# Patient Record
Sex: Female | Born: 1995 | Race: White | Hispanic: No | Marital: Married | Smoking: Never smoker
Health system: Southern US, Community
[De-identification: ages and names within clinical notes are randomized; demographics above are authoritative.]

## PROBLEM LIST (undated history)

## (undated) ENCOUNTER — Inpatient Hospital Stay: Payer: Self-pay

## (undated) ENCOUNTER — Inpatient Hospital Stay (HOSPITAL_COMMUNITY): Payer: Self-pay

## (undated) DIAGNOSIS — O24419 Gestational diabetes mellitus in pregnancy, unspecified control: Secondary | ICD-10-CM

## (undated) DIAGNOSIS — E669 Obesity, unspecified: Secondary | ICD-10-CM

## (undated) DIAGNOSIS — J45909 Unspecified asthma, uncomplicated: Secondary | ICD-10-CM

## (undated) DIAGNOSIS — R519 Headache, unspecified: Secondary | ICD-10-CM

## (undated) DIAGNOSIS — Z789 Other specified health status: Secondary | ICD-10-CM

## (undated) HISTORY — PX: NO PAST SURGERIES: SHX2092

## (undated) HISTORY — DX: Gestational diabetes mellitus in pregnancy, unspecified control: O24.419

---
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
## 2014-01-31 ENCOUNTER — Ambulatory Visit: Payer: Self-pay | Admitting: Family Medicine

## 2014-01-31 LAB — RAPID STREP-A WITH REFLX: MICRO TEXT REPORT: NEGATIVE

## 2014-02-01 LAB — BETA STREP CULTURE(ARMC)

## 2014-02-16 ENCOUNTER — Ambulatory Visit: Payer: Self-pay | Admitting: Family Medicine

## 2014-11-30 NOTE — L&D Delivery Note (Signed)
Deliver Note   Date of Delivery:   08/12/2015 Primary OB:   [facility] Gestational Age/EDD: [redacted]w[redacted]d by 07/24/2015, by Ultrasound  Antepartum complications:  OB History as of 07/15/15    Gravida Para Term Preterm AB TAB SAB Ectopic Multiple Living   0 0      Delivered By:   Vena Austria MD  Delivery Type:   TSVD, epidural     Intrapartum complications:  GBS:    Negative (07/27 0000) Laceration:      none Episiotomy:    none Placenta:    Spontaneous Estimated Blood Loss:   Baby:     Liveborn female  APGAR (1 MIN): 9  APGAR (5 MINS):  9  weight 2880g  Deliver Details   At 02:37 a female was delivered via TSVD (Presentation:OA  ).  APGAR: 9 ,9; weigh 2880g   Placenta status:intact , spontaneous.  Cord: 3 vessel cord, with the following complications: IOL GHTN   Mom to postpartum.  Baby to Couplet care / Skin to Skin.

## 2014-12-01 ENCOUNTER — Emergency Department (HOSPITAL_COMMUNITY): Payer: Medicaid Other

## 2014-12-01 ENCOUNTER — Encounter (HOSPITAL_COMMUNITY): Payer: Self-pay | Admitting: Emergency Medicine

## 2014-12-01 ENCOUNTER — Emergency Department (HOSPITAL_COMMUNITY)
Admission: EM | Admit: 2014-12-01 | Discharge: 2014-12-01 | Disposition: A | Discharge status: Home / Self Care | Payer: Medicaid Other | Attending: Emergency Medicine | Admitting: Emergency Medicine

## 2014-12-01 DIAGNOSIS — O9989 Other specified diseases and conditions complicating pregnancy, childbirth and the puerperium: Secondary | ICD-10-CM | POA: Diagnosis present

## 2014-12-01 DIAGNOSIS — Z79899 Other long term (current) drug therapy: Secondary | ICD-10-CM | POA: Diagnosis not present

## 2014-12-01 DIAGNOSIS — O26899 Other specified pregnancy related conditions, unspecified trimester: Secondary | ICD-10-CM

## 2014-12-01 DIAGNOSIS — O418X10 Other specified disorders of amniotic fluid and membranes, first trimester, not applicable or unspecified: Secondary | ICD-10-CM | POA: Diagnosis not present

## 2014-12-01 DIAGNOSIS — Z3A01 Less than 8 weeks gestation of pregnancy: Secondary | ICD-10-CM | POA: Insufficient documentation

## 2014-12-01 DIAGNOSIS — O200 Threatened abortion: Secondary | ICD-10-CM | POA: Insufficient documentation

## 2014-12-01 DIAGNOSIS — R102 Pelvic and perineal pain: Secondary | ICD-10-CM

## 2014-12-01 DIAGNOSIS — O468X1 Other antepartum hemorrhage, first trimester: Secondary | ICD-10-CM

## 2014-12-01 LAB — CBC WITH DIFFERENTIAL/PLATELET
BASOS ABS: 0 10*3/uL (ref 0.0–0.1)
BASOS PCT: 0 % (ref 0–1)
Eosinophils Absolute: 0.1 10*3/uL (ref 0.0–0.7)
Eosinophils Relative: 1 % (ref 0–5)
HCT: 36.8 % (ref 36.0–46.0)
Hemoglobin: 12.3 g/dL (ref 12.0–15.0)
Lymphocytes Relative: 28 % (ref 12–46)
Lymphs Abs: 3.1 10*3/uL (ref 0.7–4.0)
MCH: 28.8 pg (ref 26.0–34.0)
MCHC: 33.4 g/dL (ref 30.0–36.0)
MCV: 86.2 fL (ref 78.0–100.0)
MONO ABS: 0.9 10*3/uL (ref 0.1–1.0)
Monocytes Relative: 8 % (ref 3–12)
NEUTROS ABS: 7 10*3/uL (ref 1.7–7.7)
NEUTROS PCT: 63 % (ref 43–77)
Platelets: 247 10*3/uL (ref 150–400)
RBC: 4.27 MIL/uL (ref 3.87–5.11)
RDW: 12.8 % (ref 11.5–15.5)
WBC: 11.1 10*3/uL — AB (ref 4.0–10.5)

## 2014-12-01 LAB — BASIC METABOLIC PANEL
ANION GAP: 7 (ref 5–15)
BUN: 9 mg/dL (ref 6–23)
CHLORIDE: 105 meq/L (ref 96–112)
CO2: 25 mmol/L (ref 19–32)
Calcium: 9 mg/dL (ref 8.4–10.5)
Creatinine, Ser: 0.55 mg/dL (ref 0.50–1.10)
GFR calc non Af Amer: >90 mL/min (ref >90)
Glucose, Bld: 96 mg/dL (ref 70–99)
POTASSIUM: 3.7 mmol/L (ref 3.5–5.1)
SODIUM: 137 mmol/L (ref 135–145)

## 2014-12-01 LAB — URINE MICROSCOPIC-ADD ON

## 2014-12-01 LAB — SAMPLE TO BLOOD BANK

## 2014-12-01 LAB — PREGNANCY, URINE: Preg Test, Ur: POSITIVE — AB

## 2014-12-01 LAB — URINALYSIS, ROUTINE W REFLEX MICROSCOPIC
Bilirubin Urine: NEGATIVE
GLUCOSE, UA: NEGATIVE mg/dL
Hgb urine dipstick: NEGATIVE
Ketones, ur: NEGATIVE mg/dL
Nitrite: NEGATIVE
PH: 6 (ref 5.0–8.0)
Protein, ur: NEGATIVE mg/dL
Specific Gravity, Urine: 1.025 (ref 1.005–1.030)
Urobilinogen, UA: 0.2 mg/dL (ref 0.0–1.0)

## 2014-12-01 LAB — OB RESULTS CONSOLE HEPATITIS B SURFACE ANTIGEN: HEP B S AG: NEGATIVE

## 2014-12-01 LAB — I-STAT BETA HCG BLOOD, ED (MC, WL, AP ONLY): I-stat hCG, quantitative: >2000 m[IU]/mL — ABNORMAL HIGH (ref <5)

## 2014-12-01 LAB — OB RESULTS CONSOLE VARICELLA ZOSTER ANTIBODY, IGG: Varicella: NON-IMMUNE/NOT IMMUNE

## 2014-12-01 LAB — OB RESULTS CONSOLE RUBELLA ANTIBODY, IGM: Rubella: IMMUNE

## 2014-12-01 NOTE — Discharge Instructions (Signed)
Get plenty of rest, and drink a lot of fluids. No sexual intercourse, until you that are evaluated by a gynecologist. If you develop increased pain, bleeding or other discomforts, go immediately to [facility] in [place] to be seen by a gynecologist. Try to see a obstetrician, as soon as possible for further care, of the pregnancy.   Pelvic Rest Pelvic rest is sometimes recommended for women when:   The placenta is partially or completely covering the opening of the cervix (placenta previa).  There is bleeding between the uterine wall and the amniotic sac in the first trimester (subchorionic hemorrhage).  The cervix begins to open without labor starting (incompetent cervix, cervical insufficiency).  The labor is too early (preterm labor). HOME CARE INSTRUCTIONS  Do not have sexual intercourse, stimulation, or an orgasm.  Do not use tampons, douche, or put anything in the vagina.  Do not lift anything over 10 pounds (4.5 kg).  Avoid strenuous activity or straining your pelvic muscles. SEEK MEDICAL CARE IF:  You have any vaginal bleeding during pregnancy. Treat this as a potential emergency. Threatened Miscarriage A threatened miscarriage occurs when you have vaginal bleeding during your first 20 weeks of pregnancy but the pregnancy has not ended. If you have vaginal bleeding during this time, your health care provider will do tests to make sure you are still pregnant. If the tests show you are still pregnant and the developing baby (fetus) inside your womb (uterus) is still growing, your condition is considered a threatened miscarriage. A threatened miscarriage does not mean your pregnancy will end, but it does increase the risk of losing your pregnancy (complete miscarriage). CAUSES  The cause of a threatened miscarriage is usually not known. If you go on to have a complete miscarriage, the most common cause is an abnormal number of chromosomes in the developing baby.  Chromosomes are the structures inside cells that hold all your genetic material. Some causes of vaginal bleeding that do not result in miscarriage include: Having sex. Having an infection. Normal hormone changes of pregnancy. Bleeding that occurs when an egg implants in your uterus. RISK FACTORS Risk factors for bleeding in early pregnancy include: Obesity. Smoking. Drinking excessive amounts of alcohol or caffeine. Recreational drug use. SIGNS AND SYMPTOMS Light vaginal bleeding. Mild abdominal pain or cramps. DIAGNOSIS  If you have bleeding with or without abdominal pain before 20 weeks of pregnancy, your health care provider will do tests to check whether you are still pregnant. One important test involves using sound waves and a computer (ultrasound) to create images of the inside of your uterus. Other tests include an internal exam of your vagina and uterus (pelvic exam) and measurement of your baby's heart rate.  You may be diagnosed with a threatened miscarriage if: Ultrasound testing shows you are still pregnant. Your baby's heart rate is strong. A pelvic exam shows that the opening between your uterus and your vagina (cervix) is closed. Your heart rate and blood pressure are stable. Blood tests confirm you are still pregnant. TREATMENT  No treatments have been shown to prevent a threatened miscarriage from going on to a complete miscarriage. However, the right home care is important.  HOME CARE INSTRUCTIONS  Make sure you keep all your appointments for prenatal care. This is very important. Get plenty of rest. Do not have sex or use tampons if you have vaginal bleeding. Do not douche. Do not smoke or use recreational drugs. Do not drink alcohol. Avoid caffeine. SEEK MEDICAL CARE IF:  You have light vaginal bleeding or spotting while pregnant. You have abdominal pain or cramping. You have a fever. SEEK IMMEDIATE MEDICAL CARE IF: You have heavy vaginal bleeding. You have  blood clots coming from your vagina. You have severe low back pain or abdominal cramps. You have fever, chills, and severe abdominal pain. MAKE SURE YOU: Understand these instructions. Will watch your condition. Will get help right away if you are not doing well or get worse. Document Released: 11/16/2005 Document Revised: 11/21/2013 Document Reviewed: 09/12/2013 [facility] Patient Information 2015 [place], [place]. This information is not intended to replace advice given to you by your health care provider. Make sure you discuss any questions you have with your health care provider.  Subchorionic Hematoma A subchorionic hematoma is a gathering of blood between the outer wall of the placenta and the inner wall of the womb (uterus). The placenta is the organ that connects the fetus to the wall of the uterus. The placenta performs the feeding, breathing (oxygen to the fetus), and waste removal (excretory work) of the fetus.  Subchorionic hematoma is the most common abnormality found on a result from ultrasonography done during the first trimester or early second trimester of pregnancy. If there has been little or no vaginal bleeding, early small hematomas usually shrink on their own and do not affect your baby or pregnancy. The blood is gradually absorbed over 1-2 weeks. When bleeding starts later in pregnancy or the hematoma is larger or occurs in an older pregnant woman, the outcome may not be as good. Larger hematomas may get bigger, which increases the chances for miscarriage. Subchorionic hematoma also increases the risk of premature detachment of the placenta from the uterus, preterm (premature) labor, and stillbirth. HOME CARE INSTRUCTIONS Stay on bed rest if your health care provider recommends this. Although bed rest will not prevent more bleeding or prevent a miscarriage, your health care provider may recommend bed rest until you are advised otherwise. Avoid heavy lifting (more than 10 lb [4.5  kg]), exercise, sexual intercourse, or douching as directed by your health care provider. Keep track of the number of pads you use each day and how soaked (saturated) they are. Write down this information. Do not use tampons. Keep all follow-up appointments as directed by your health care provider. Your health care provider may ask you to have follow-up blood tests or ultrasound tests or both. SEEK IMMEDIATE MEDICAL CARE IF: You have severe cramps in your stomach, back, abdomen, or pelvis. You have a fever. You pass large clots or tissue. Save any tissue for your health care provider to look at. Your bleeding increases or you become lightheaded, feel weak, or have fainting episodes. Document Released: 03/03/2007 Document Revised: 04/02/2014 Document Reviewed: 06/15/2013 [facility] Patient Information 2015 [place], [place]. This information is not intended to replace advice given to you by your health care provider. Make sure you discuss any questions you have with your health care provider.   You have cramping pain felt low in the stomach (stronger than menstrual cramps).  You notice vaginal discharge (watery, mucus, or bloody).  You have a low, dull backache.  There are regular contractions or uterine tightening. SEEK IMMEDIATE MEDICAL CARE IF: You have vaginal bleeding and have placenta previa.  Document Released: 03/13/2011 Document Revised: 02/08/2012 Document Reviewed: 03/13/2011 [facility] Patient Information 2015 [place], [place]. This information is not intended to replace advice given to you by your health care provider. Make sure you discuss any questions you have with your health care  provider.

## 2014-12-01 NOTE — ED Notes (Signed)
Pt c/o lower abdominal cramping with N/V. Reports she found out on Sunday that she is pregnant and states she is about 6.[redacted] weeks along. Has not been seen by an OB/GYN

## 2014-12-01 NOTE — ED Provider Notes (Signed)
CSN: [id number]     Arrival date & time 12/01/14  2031 History   First MD Initiated Contact with Patient 12/01/14 2042     This chart was scribed for Flint Melter, MD by Arlan Organ, ED Scribe. This patient was seen in room APA06/APA06 and the patient's care was started 11:27 PM.   Chief Complaint  Patient presents with  . Abdominal Cramping   The history is provided by the patient. No language interpreter was used.    HPI Comments: Gina Gomez G1P0 currently 6.[redacted] weeks gestation is a 19 y.o. female who presents to the Emergency Department complaining of intermittent, moderate abdominal pain x 3 days. Pt describes pain as "cramping" and states discomfort is more severe than menstrual cramps. She also reports mild nausea. Pt has not started prenatal care. LNMP 10/17/14 with positive pregnancy testing at [facility]. No recent fever, chills, vomiting, or vaginal bleeding. No known allergies to medications.  There are no other known modifying factors.   History reviewed. No pertinent past medical history. History reviewed. No pertinent past surgical history. No family history on file. History  Substance Use Topics  . Smoking status: Never Smoker   . Smokeless tobacco: Not on file  . Alcohol Use: No   OB History    Gravida Para Term Preterm AB TAB SAB Ectopic Multiple Living   1              Review of Systems  Constitutional: Negative for fever and chills.  Gastrointestinal: Positive for nausea and abdominal pain. Negative for vomiting.  Genitourinary: Negative for vaginal bleeding.  All other systems reviewed and are negative.     Allergies  Review of patient's allergies indicates no known allergies.  Home Medications   Prior to Admission medications   Medication Sig Start Date End Date Taking? Authorizing Provider  Prenatal Vit-Fe Fumarate-FA (MULTIVITAMIN-PRENATAL) 27-0.8 MG TABS tablet Take 1 tablet by mouth daily at 12 noon.   Yes Historical Provider, MD   nitrofurantoin, macrocrystal-monohydrate, (MACROBID) 100 MG capsule Take 1 capsule (100 mg total) by mouth 2 (two) times daily. X 7 days Patient not taking: Reported on 12/01/2014 11/19/13   Hanley Seamen, MD   Physical Exam  Constitutional: She is oriented to person, place, and time. She appears well-developed and well-nourished.  HENT:  Head: Normocephalic and atraumatic.  Eyes: Conjunctivae and EOM are normal. Pupils are equal, round, and reactive to light.  Neck: Normal range of motion and phonation normal. Neck supple.  Cardiovascular: Normal rate and regular rhythm.   Pulmonary/Chest: Effort normal and breath sounds normal. She exhibits no tenderness.  Abdominal: Soft. She exhibits no distension. There is no tenderness. There is no guarding.  Mild suprapubic tenderness  Genitourinary:  Normal external female genitalia.  Small amount of mucus in the vagina, but no frank discharge.  Cervix is normal.  The cervix is not dilated and there is no bleeding per the os.  On bimanual examination the uterus is somewhat tender.  It is not enlarged.  There is no adnexal tenderness, or mass.  Musculoskeletal: Normal range of motion.  Neurological: She is alert and oriented to person, place, and time. She exhibits normal muscle tone.  Skin: Skin is warm and dry.  Psychiatric: She has a normal mood and affect. Her behavior is normal. Judgment and thought content normal.  Nursing note and vitals reviewed.   ED Course  Procedures (including critical care time)  DIAGNOSTIC STUDIES: Oxygen Saturation is 100% on  RA, Normal by my interpretation.    COORDINATION OF CARE:  Medications - No data to display   Patient Vitals for the past 24 hrs:  BP Temp Temp src Pulse Resp SpO2 Height Weight  12/01/14 2326 122/74 mmHg 98.5 F (36.9 C) Oral 85 18 100 % - -  12/01/14 2037 124/78 mmHg 98.5 F (36.9 C) Oral 99 20 100 %  (1.575 m) 157 lb 12.8 oz (71.578 kg)     9:15 PM-Discussed treatment plan  with pt at bedside and pt agreed to plan.    11:26 PM Reevaluation with update and discussion. After initial assessment and treatment, an updated evaluation reveals she is comfortable.  She is able to write without problems.Mancel Bale L    Labs Review Labs Reviewed  URINALYSIS, ROUTINE W REFLEX MICROSCOPIC - Abnormal; Notable for the following:    APPearance CLOUDY (*)    Leukocytes, UA TRACE (*)    All other components within normal limits  PREGNANCY, URINE - Abnormal; Notable for the following:    Preg Test, Ur POSITIVE (*)    All other components within normal limits  CBC WITH DIFFERENTIAL - Abnormal; Notable for the following:    WBC 11.1 (*)    All other components within normal limits  URINE MICROSCOPIC-ADD ON - Abnormal; Notable for the following:    Squamous Epithelial / LPF FEW (*)    All other components within normal limits  I-STAT BETA HCG BLOOD, ED ([facility], [facility], [facility] ONLY) - Abnormal; Notable for the following:    I-stat hCG, quantitative >2000.0 (*)    All other components within normal limits  GC/CHLAMYDIA PROBE AMP  BASIC METABOLIC PANEL  RPR  HIV ANTIBODY (ROUTINE TESTING)  SAMPLE TO BLOOD BANK    Imaging Review US Ob Comp Less 14 Wks  12/01/2014   CLINICAL DATA:  Acute onset lower abdominal pain. Nausea and vomiting. Estimated gestational age by LMP is 6 weeks 3 days. Quantitative beta HCG is 22,000.  EXAM: OBSTETRIC <14 WK [place] AND TRANSVAGINAL OB US  TECHNIQUE: Both transabdominal and transvaginal ultrasound examinations were performed for complete evaluation of the gestation as well as the maternal uterus, adnexal regions, and pelvic cul-de-sac. Transvaginal technique was performed to assess early pregnancy.  COMPARISON:  None.  FINDINGS: Intrauterine gestational sac: A single intrauterine gestational sac is demonstrated.  Yolk sac:  Yolk sac is visualized.  Embryo:  Fetal pole is identified.  Cardiac Activity: Fetal cardiac activity is observed.  Heart Rate:  147 bpm   CRL:   6.3  mm   6 w 3 d                  [place] EDC: 07/24/2015  Maternal uterus/adnexae: Uterus is anteverted. No myometrial mass lesions identified. Small subchorionic hemorrhage is visualized. Both ovaries are identified and appear normal. Corpus luteum cyst on the left. Normal follicular changes on the right. No free fluid in the pelvis.  IMPRESSION: Single intrauterine pregnancy. Estimated gestational age by crown-rump length is 6 weeks 3 days. Small subchorionic hemorrhage.   Electronically Signed   By: Burman Nieves M.D.   On: 12/01/2014 22:16   US Ob Transvaginal  12/01/2014   CLINICAL DATA:  Acute onset lower abdominal pain. Nausea and vomiting. Estimated gestational age by LMP is 6 weeks 3 days. Quantitative beta HCG is 22,000.  EXAM: OBSTETRIC <14 WK [place] AND TRANSVAGINAL OB US  TECHNIQUE: Both transabdominal and transvaginal ultrasound examinations were performed for complete evaluation of  the gestation as well as the maternal uterus, adnexal regions, and pelvic cul-de-sac. Transvaginal technique was performed to assess early pregnancy.  COMPARISON:  None.  FINDINGS: Intrauterine gestational sac: A single intrauterine gestational sac is demonstrated.  Yolk sac:  Yolk sac is visualized.  Embryo:  Fetal pole is identified.  Cardiac Activity: Fetal cardiac activity is observed.  Heart Rate:  147 bpm  CRL:   6.3  mm   6 w 3 d                  [place] EDC: 07/24/2015  Maternal uterus/adnexae: Uterus is anteverted. No myometrial mass lesions identified. Small subchorionic hemorrhage is visualized. Both ovaries are identified and appear normal. Corpus luteum cyst on the left. Normal follicular changes on the right. No free fluid in the pelvis.  IMPRESSION: Single intrauterine pregnancy. Estimated gestational age by crown-rump length is 6 weeks 3 days. Small subchorionic hemorrhage.   Electronically Signed   By: Burman Nieves M.D.   On: 12/01/2014 22:16     EKG Interpretation None      MDM   Final  diagnoses:  Pelvic pain affecting pregnancy  Subchorionic hemorrhage, first trimester  Threatened abortion        Pelvic pain and vaginal bleeding are related to subchorionic hemorrhage with live intrauterine pregnancy.  No evidence for heterotopic ectopic pregnancy.  Patient is at increased risk for spontaneous abortion, because of the subchorionic hemorrhage.  She is clinically stable during the ED evaluation and understands the importance of pelvic rest and follow-up with obstetrics as soon as possible, to initiate prenatal care.   Nursing Notes Reviewed/ Care Coordinated Applicable Imaging Reviewed Interpretation of Laboratory Data incorporated into ED treatment  The patient appears reasonably screened and/or stabilized for discharge and I doubt any other medical condition or other [facility] requiring further screening, evaluation, or treatment in the ED at this time prior to discharge.  Plan: Home Medications- Tylenol/Motrin prn; Home Treatments- Pelvic Rest; return here if the recommended treatment, does not improve the symptoms; Recommended follow up- OB asap and prn     I personally performed the services described in this documentation, which was scribed in my presence. The recorded information has been reviewed and is accurate.    Flint Melter, MD 12/01/14 [phone]

## 2014-12-01 NOTE — ED Notes (Signed)
Discharge instructions given, pt demonstrated teach back and verbal understanding. No concerns voiced.  

## 2014-12-03 LAB — HIV ANTIBODY (ROUTINE TESTING W REFLEX): HIV: NONREACTIVE

## 2014-12-03 LAB — RPR

## 2014-12-05 LAB — GC/CHLAMYDIA PROBE AMP
CT Probe RNA: NEGATIVE
GC PROBE AMP APTIMA: NEGATIVE

## 2014-12-07 ENCOUNTER — Inpatient Hospital Stay (HOSPITAL_COMMUNITY)
Admission: AD | Admit: 2014-12-07 | Discharge: 2014-12-08 | Disposition: A | Discharge status: Home / Self Care | Payer: Medicaid Other | Source: Ambulatory Visit | Attending: Obstetrics and Gynecology | Admitting: Obstetrics and Gynecology

## 2014-12-07 ENCOUNTER — Encounter (HOSPITAL_COMMUNITY): Payer: Self-pay | Admitting: *Deleted

## 2014-12-07 DIAGNOSIS — O209 Hemorrhage in early pregnancy, unspecified: Secondary | ICD-10-CM

## 2014-12-07 DIAGNOSIS — Z3A01 Less than 8 weeks gestation of pregnancy: Secondary | ICD-10-CM | POA: Insufficient documentation

## 2014-12-07 DIAGNOSIS — O208 Other hemorrhage in early pregnancy: Secondary | ICD-10-CM | POA: Insufficient documentation

## 2014-12-07 HISTORY — DX: Other specified health status: Z78.9

## 2014-12-07 NOTE — MAU Note (Signed)
[facility] and was told i had bleeding on my uterus. Have had off and on spotting since then. Had more spotting tonight than previously. No pain currently.

## 2014-12-08 ENCOUNTER — Encounter (HOSPITAL_COMMUNITY): Payer: Self-pay | Admitting: *Deleted

## 2014-12-08 ENCOUNTER — Inpatient Hospital Stay (HOSPITAL_COMMUNITY): Payer: Medicaid Other

## 2014-12-08 DIAGNOSIS — Z3A01 Less than 8 weeks gestation of pregnancy: Secondary | ICD-10-CM | POA: Diagnosis not present

## 2014-12-08 DIAGNOSIS — N939 Abnormal uterine and vaginal bleeding, unspecified: Secondary | ICD-10-CM | POA: Diagnosis present

## 2014-12-08 DIAGNOSIS — O208 Other hemorrhage in early pregnancy: Secondary | ICD-10-CM | POA: Diagnosis not present

## 2014-12-08 LAB — ABO/RH: ABO/RH(D): O POS

## 2014-12-08 NOTE — Progress Notes (Signed)
Bimanual exam by J.Rasch,NP  Cervix is closed

## 2014-12-08 NOTE — Progress Notes (Signed)
Gina CarbonJennifer Rasch NP in earlier to discuss test results and d/c plan. Will have AB0-RH drawn and then pt will leave. NP will call her if she needs to return for Rhophylac. Pt agrees with plan. Written and verbal d/c instructions given and understanding voiced

## 2014-12-08 NOTE — Discharge Instructions (Signed)
Vaginal Bleeding During Pregnancy, First Trimester  A small amount of bleeding (spotting) from the vagina is relatively common in early pregnancy. It usually stops on its own. Various things may cause bleeding or spotting in early pregnancy. Some bleeding may be related to the pregnancy, and some may not. In most cases, the bleeding is normal and is not a problem. However, bleeding can also be a sign of something serious. Be sure to tell your health care provider about any vaginal bleeding right away.  Some possible causes of vaginal bleeding during the first trimester include:  · Infection or inflammation of the cervix.  · Growths (polyps) on the cervix.  · Miscarriage or threatened miscarriage.  · Pregnancy tissue has developed outside of the uterus and in a fallopian tube (tubal pregnancy).  · Tiny cysts have developed in the uterus instead of pregnancy tissue (molar pregnancy).  HOME CARE INSTRUCTIONS   Watch your condition for any changes. The following actions may help to lessen any discomfort you are feeling:  · Follow your health care provider's instructions for limiting your activity. If your health care provider orders bed rest, you may need to stay in bed and only get up to use the bathroom. However, your health care provider may allow you to continue light activity.  · If needed, make plans for someone to help with your regular activities and responsibilities while you are on bed rest.  · Keep track of the number of pads you use each day, how often you change pads, and how soaked (saturated) they are. Write this down.  · Do not use tampons. Do not douche.  · Do not have sexual intercourse or orgasms until approved by your health care provider.  · If you pass any tissue from your vagina, save the tissue so you can show it to your health care provider.  · Only take over-the-counter or prescription medicines as directed by your health care provider.  · Do not take aspirin because it can make you  bleed.  · Keep all follow-up appointments as directed by your health care provider.  SEEK MEDICAL CARE IF:  · You have any vaginal bleeding during any part of your pregnancy.  · You have cramps or labor pains.  · You have a fever, not controlled by medicine.  SEEK IMMEDIATE MEDICAL CARE IF:   · You have severe cramps in your back or belly (abdomen).  · You pass large clots or tissue from your vagina.  · Your bleeding increases.  · You feel light-headed or weak, or you have fainting episodes.  · You have chills.  · You are leaking fluid or have a gush of fluid from your vagina.  · You pass out while having a bowel movement.  MAKE SURE YOU:  · Understand these instructions.  · Will watch your condition.  · Will get help right away if you are not doing well or get worse.  Document Released: 08/26/2005 Document Revised: 11/21/2013 Document Reviewed: 07/24/2013  ExitCare® Patient Information ©2015 [employer]. This information is not intended to replace advice given to you by your health care provider. Make sure you discuss any questions you have with your health care provider.

## 2014-12-08 NOTE — MAU Provider Note (Signed)
History     CSN: [id number]  Arrival date and time: 12/07/14 2338   First Provider Initiated Contact with Patient 12/08/14 [id number]      Chief Complaint  Patient presents with  . Vaginal Bleeding   HPI   Ms.Gina Gomez is a 19 y.o. female who presents with vaginal bleeding that started 5 days ago. It has been off and on all week. In the last 24 hours she has had more vaginal bleeding and wants to make sure everything is ok. She denies pain at this time.   She was seen at [facility] on 1/2 and had an [place] that showed an IUP with a small subchorionic hemorrhage. She was told that if she started bleeding that she would need to seek medical attention.   She denies bleeding at this time.   OB History    Gravida Para Term Preterm AB TAB SAB Ectopic Multiple Living   1               Past Medical History  Diagnosis Date  . Medical history non-contributory     Past Surgical History  Procedure Laterality Date  . No past surgeries      Family History  Problem Relation Age of Onset  . Cancer Maternal Grandmother   . Hypertension Maternal Grandfather   . Diabetes Maternal Grandfather   . Heart disease Maternal Grandfather     History  Substance Use Topics  . Smoking status: Never Smoker   . Smokeless tobacco: Not on file  . Alcohol Use: No    Allergies: No Known Allergies  Prescriptions prior to admission  Medication Sig Dispense Refill Last Dose  . acetaminophen (TYLENOL) 325 MG tablet Take 650 mg by mouth every 6 (six) hours as needed.   Past Week at Unknown time  . alum hydroxide-mag trisilicate (GAVISCON) 80-20 MG CHEW chewable tablet Chew by mouth.   12/07/2014 at Unknown time  . Prenatal Vit-Fe Fumarate-FA (MULTIVITAMIN-PRENATAL) 27-0.8 MG TABS tablet Take 1 tablet by mouth daily at 12 noon.   12/07/2014 at Unknown time  . nitrofurantoin, macrocrystal-monohydrate, (MACROBID) 100 MG capsule Take 1 capsule (100 mg total) by mouth 2 (two) times daily. X 7 days (Patient not  taking: Reported on 12/01/2014) 14 capsule 0    Results for orders placed or performed during the hospital encounter of 12/07/14 (from the past 48 hour(s))  ABO/Rh     Status: None   Collection Time: 12/08/14  4:43 AM  Result Value Ref Range   ABO/RH(D) O POS      US Ob Transvaginal  12/08/2014   CLINICAL DATA:  Vaginal bleeding since last Saturday. Increase tonight. No pain. Estimated gestational age by LMP is 7 weeks 3 days. Quantitative beta HCG was not obtained.  EXAM: TRANSVAGINAL OB ULTRASOUND  TECHNIQUE: Transvaginal ultrasound was performed for complete evaluation of the gestation as well as the maternal uterus, adnexal regions, and pelvic cul-de-sac.  COMPARISON:  12/01/2014 from [facility]  FINDINGS: Intrauterine gestational sac: A single intrauterine pregnancy is identified.  Yolk sac:  Yolk sac is present.  Embryo:  Fetal pole is present.  Cardiac Activity: Fetal cardiac activity is identified.  Heart Rate: 140 bpm  CRL:   11.5  mm   7 w 3 d                  [place] EDC: 07/24/2015  Maternal uterus/adnexae: Uterus is anteverted. No myometrial mass lesions. Minimal subchorionic hemorrhage is identified. Both  ovaries are visualized and appear normal. Corpus luteum cyst on the left ovary. No abnormal adnexal masses. No free pelvic fluid.  IMPRESSION: Single intrauterine pregnancy. Estimated gestational age by crown-rump length is 7 weeks 3 days, representing appropriate interval growth since previous study. Minimal subchorionic hemorrhage demonstrated.   Electronically Signed   By: Burman NievesWilliam  Stevens M.D.   On: 12/08/2014 03:59    Review of Systems  Constitutional: Negative for fever and chills.  Gastrointestinal: Positive for nausea and vomiting. Negative for abdominal pain, diarrhea and constipation.  Genitourinary: Negative for dysuria, urgency, frequency and hematuria.       No vaginal discharge. No vaginal bleeding; non currently  No dysuria.    Physical Exam   Blood pressure  124/72, pulse 83, temperature 97.9 F (36.6 C), temperature source Oral, resp. rate 18, height 5\' 2"  (1.575 m), weight 72.213 kg (159 lb 3.2 oz), last menstrual period 10/17/2014.  Physical Exam  Constitutional: She is oriented to person, place, and time. She appears well-developed and well-nourished. No distress.  HENT:  Head: Normocephalic.  Eyes: Pupils are equal, round, and reactive to light.  Neck: Neck supple.  Respiratory: Effort normal.  GI: Soft. She exhibits no distension. There is no tenderness. There is no rebound and no guarding.  Genitourinary:  Bimanual exam: Cervix closed Uterus non tender, enlarged  Small amount of dark brown blood noted on exam glove.  Chaperone present for exam.   Musculoskeletal: Normal range of motion.  Neurological: She is alert and oriented to person, place, and time.  Skin: Skin is warm. She is not diaphoretic.  Psychiatric: Her behavior is normal.    MAU Course  Procedures  None  MDM US  ABO pending  O positive blood type Patient had recent pelvic exam with STD testing on 12/01/14.  Assessment and Plan   A:  SIUP 7152w3d Subchorionic hemorrhage; minimal on US   P:  Discharge home in stable condition Pelvic rest Bleeding precautions Start prenatal care ASAP    Iona HansenJennifer Irene Stacy Sailer, NP 12/08/2014 4:38 AM

## 2015-05-21 ENCOUNTER — Encounter: Payer: Medicaid Other | Attending: Obstetrics & Gynecology | Admitting: *Deleted

## 2015-05-21 ENCOUNTER — Encounter: Payer: Self-pay | Admitting: *Deleted

## 2015-05-21 VITALS — BP 106/62 | Ht 61.0 in | Wt 197.5 lb

## 2015-05-21 DIAGNOSIS — O24410 Gestational diabetes mellitus in pregnancy, diet controlled: Secondary | ICD-10-CM

## 2015-05-21 DIAGNOSIS — O24419 Gestational diabetes mellitus in pregnancy, unspecified control: Secondary | ICD-10-CM | POA: Insufficient documentation

## 2015-05-21 NOTE — Progress Notes (Signed)
Diabetes Self-Management Education  Visit Type: First/Initial  Appt. Start Time: 0840 Appt. End Time: 1010  05/21/2015  [name], identified by name and date of birth, is a 19 y.o. female with a diagnosis of Diabetes: Gestational Diabetes.    ASSESSMENT Blood pressure 106/62, height 5\' 1"  (1.549 m), weight 197 lb 8 oz (89.585 kg), last menstrual period 10/17/2014. Body mass index is 37.34 kg/(m^2).  Initial Visit Information: Are you currently following a meal plan?: No Are you taking your medications as prescribed?: No (She is not taking prenatal vitamin daily) Are you checking your feet?: No How often do you need to have someone help you when you read instructions, pamphlets, or other written materials from your doctor or pharmacy?: 1 - Never What is the last grade level you completed in school?: 12th  Psychosocial: Patient Belief/Attitude about Diabetes: Afraid ("nervous for my baby") Self-care barriers: None Self-management support: Family Patient Concerns: Glycemic Control, Healthy Lifestyle, Other (Become more fit) Special Needs: None Preferred Learning Style: Visual Learning Readiness: Contemplating  Complications:  How often do you check your blood sugar?: 0 times/day (not testing) Provided Accu-Chek Nano meter and instructed on use. BG upon return demonstration was 72 mg/dL at 8:59 - 2 1/2 hrs pp. Have you had a dilated eye exam in the past 12 months?: No Have you had a dental exam in the past 12 months?: Yes  Diet Intake: Breakfast: pancakes, sausage or grits, eggs; awakes around 3-4 am and eats fruit or sweets Snack (morning): banana or sweets Lunch: left overs Snack (afternoon): banana or sweets Dinner: meat, pasta, vegetable, salad Snack (evening): banana or sweets Beverage(s): water, juice, regular soda  Exercise: Exercise: Light (walking) Light Exercise amount of time (min / week): 75  Individualized Plan for Diabetes Self-Management Training:   Learning Objective:  Patient will have a greater understanding of diabetes self-management.  Education Topics Reviewed with Patient Today: Definition of diabetes, type 1 and 2, and the diagnosis of diabetes, Factors that contribute to the development of diabetes Role of diet in the treatment of diabetes and the relationship between the three main macronutrients and blood glucose level Helped patient identify appropriate exercises in relation to his/her diabetes, diabetes complications and other health issue. Taught/evaluated SMBG meter., Purpose and frequency of SMBG., Identified appropriate SMBG and/or A1C goals. Relationship between chronic complications and blood glucose control Pregnancy and GDM  Role of pre-pregnancy blood glucose control on the development of the fetus, Reviewed with patient blood glucose goals with pregnancy Lifestyle issues that need to be addressed for better diabetes care (importance of taking prenatal vitamins)  PATIENTS GOALS/Plan (Developed by the patient): Read booklet on Gestational Diabetes Follow Gestational Meal Planning Guidelines Complete a 3 Day Food Record and bring to next appointment Avoid fruit juices, sugar sweetened beverages, sweets Check blood sugars 4 x day - before breakfast and 2 hrs after every meal and record  Call MD for prescription for meter strips and lancets Strips  Accu-Check Smark/View Lancets  Accu-Chek FastClix Bring blood sugar log to next appointment Walk 20-30 minutes at least 5 x week if permitted by MD  Expected Outcomes:  Demonstrated limited interest in learning.  Expect minimal changes  Education material provided: Gestational Meal Planning Guidelines Viewed Gestational Diabetes Video Accu-Chek Nano Meter 3 Day Food Record Goals for a Healthy Pregnancy  If problems or questions, patient to contact team via:   Sharion Settler, RN, CCM, CDE [phone]  Future DSME appointment:  Wednesday May 29, 2015  ay 9:00  am - with dietitian

## 2015-05-29 ENCOUNTER — Ambulatory Visit: Payer: Medicaid Other | Admitting: Dietician

## 2015-06-05 ENCOUNTER — Observation Stay
Admission: EM | Admit: 2015-06-05 | Discharge: 2015-06-05 | Disposition: A | Discharge status: Home / Self Care | Payer: Medicaid Other | Attending: Obstetrics and Gynecology | Admitting: Obstetrics and Gynecology

## 2015-06-05 ENCOUNTER — Encounter: Payer: Self-pay | Admitting: Certified Nurse Midwife

## 2015-06-05 DIAGNOSIS — O24410 Gestational diabetes mellitus in pregnancy, diet controlled: Secondary | ICD-10-CM | POA: Insufficient documentation

## 2015-06-05 DIAGNOSIS — O99213 Obesity complicating pregnancy, third trimester: Secondary | ICD-10-CM | POA: Diagnosis not present

## 2015-06-05 DIAGNOSIS — Z3A33 33 weeks gestation of pregnancy: Secondary | ICD-10-CM | POA: Diagnosis not present

## 2015-06-05 DIAGNOSIS — O288 Other abnormal findings on antenatal screening of mother: Secondary | ICD-10-CM | POA: Diagnosis present

## 2015-06-05 HISTORY — DX: Obesity, unspecified: E66.9

## 2015-06-05 NOTE — H&P (Signed)
  Obstetric History and Physical  Gina Gomez is a 19 y.o. G1P0 with IUP at 2459w0d presenting for non-reactive NST in the office today. The patient had an NST for diet controlled DM. Patient states she has +FM, denies vb, lof or ctx. Her prenatal course is significant for obesity with a BMI of 37 and a 44 # weight gain so far this pregnancy, GDM diet controlled,  Prenatal Course Source of Care: [facility] with onset of care at  8 weeks Pregnancy complications or risks: Patient Active Problem List   Diagnosis Date Noted  . Non-reactive NST (non-stress test) 06/05/2015     Prenatal labs and studies: ABO, Rh: --/--/O POS (01/09 [id number]) Antibody:   Rubella: Immune (01/02 0000) Varicella: non-immune RPR: NON REAC (01/02 2116)  HBsAg: Negative (01/02 0000)  HIV: NONREACTIVE (01/02 2116)  GBS:  unknown  1 hr Glucola: failed 1 and 3 hour tests  Genetic screening normal Anatomy US normal Tdap: given  Flu: NA   Past Medical History  Diagnosis Date  . Medical history non-contributory   . Obesity (BMI 35.0-39.9 without comorbidity)     Past Surgical History  Procedure Laterality Date  . No past surgeries      OB History  Gravida Para Term Preterm AB SAB TAB Ectopic Multiple Living  1             # Outcome Date GA Lbr Len/2nd Weight Sex Delivery Anes PTL Lv  1 Current               History   Social History  . Marital Status: Single    Spouse Name: N/A  . Number of Children: N/A  . Years of Education: N/A   Social History Main Topics  . Smoking status: Never Smoker   . Smokeless tobacco: Never Used  . Alcohol Use: No  . Drug Use: No  . Sexual Activity: Yes   Other Topics Concern  . None   Social History Narrative    Family History  Problem Relation Age of Onset  . Cancer Maternal Grandmother   . Hypertension Maternal Grandfather   . Diabetes Maternal Grandfather   . Heart disease Maternal Grandfather     Prescriptions prior to admission  Medication Sig  Dispense Refill Last Dose  . acetaminophen (TYLENOL) 325 MG tablet Take 650 mg by mouth every 6 (six) hours as needed.   Taking  . alum hydroxide-mag trisilicate (GAVISCON) 80-20 MG CHEW chewable tablet Chew by mouth.   Taking  . Prenatal Vit-Fe Fumarate-FA (MULTIVITAMIN-PRENATAL) 27-0.8 MG TABS tablet Take 1 tablet by mouth daily at 12 noon.   Not Taking    No Known Allergies  Review of Systems: Negative except for what is mentioned in HPI.  Physical Exam: LMP 10/17/2014 GENERAL: Well-developed, well-nourished female in no acute distress.  LUNGS: Clear to auscultation bilaterally.  HEART: Regular rate and rhythm. ABDOMEN: Soft, nontender, nondistended, gravid. EXTREMITIES: Nontender, no edema, 2+ distal pulses.   FHT:  Baseline rate 150 bpm   Variability moderate  Accelerations present   Decelerations none Reactive NST  Pertinent Labs/Studies:   No results found for this or any previous visit (from the past 24 hour(s)).  Assessment : Gina Gomez is a 19 y.o. G1P0 at 5359w0d being seen for non-reactive NST Cat 1 FHT- reactive NST  Plan: Discharge home and f/u at next OB appt.    Jannet Mantisourtney Emila Steinhauser, CNM [facility]

## 2015-06-05 NOTE — Discharge Instructions (Signed)
Fetal kick counts

## 2015-06-05 NOTE — Progress Notes (Signed)
Reactive nst per c subudhi,cnm. Ok to discharge pt home. Pt d/c home with d/c instructions and kick count instructions.

## 2015-06-05 NOTE — OB Triage Note (Signed)
Pt sent over from office for non reactive nst. c subudhi,cnm notified.

## 2015-06-06 ENCOUNTER — Encounter: Payer: Self-pay | Admitting: Dietician

## 2015-06-07 ENCOUNTER — Encounter: Payer: Self-pay | Admitting: *Deleted

## 2015-06-13 LAB — OB RESULTS CONSOLE GC/CHLAMYDIA
CHLAMYDIA, DNA PROBE: NEGATIVE
GC PROBE AMP, GENITAL: NEGATIVE

## 2015-06-26 LAB — OB RESULTS CONSOLE GBS: STREP GROUP B AG: NEGATIVE

## 2015-07-09 ENCOUNTER — Inpatient Hospital Stay
Admission: EM | Admit: 2015-07-09 | Discharge: 2015-07-09 | Disposition: A | Discharge status: Home / Self Care | Payer: Medicaid Other | Attending: Obstetrics & Gynecology | Admitting: Obstetrics & Gynecology

## 2015-07-09 DIAGNOSIS — Z3493 Encounter for supervision of normal pregnancy, unspecified, third trimester: Secondary | ICD-10-CM | POA: Diagnosis not present

## 2015-07-09 DIAGNOSIS — Z3A37 37 weeks gestation of pregnancy: Secondary | ICD-10-CM | POA: Insufficient documentation

## 2015-07-09 NOTE — H&P (Signed)
Obstetrics Admission History & Physical   CC: DFM   HPI:  19 y.o. G1P0 @ [redacted]w[redacted]d (07/24/2015, by Ultrasound). Admitted on 07/09/2015:   Patient Active Problem List   Diagnosis Date Noted  . Non-reactive NST (non-stress test) 06/05/2015     Presents for [facility] today..  Prenatal care at: at [facility]  PMHx:  Past Medical History  Diagnosis Date  . Medical history non-contributory   . Obesity (BMI 35.0-39.9 without comorbidity)    PSHx:  Past Surgical History  Procedure Laterality Date  . No past surgeries     Medications:  Prescriptions prior to admission  Medication Sig Dispense Refill Last Dose  . acetaminophen (TYLENOL) 325 MG tablet Take 650 mg by mouth every 6 (six) hours as needed.   Taking  . alum hydroxide-mag trisilicate (GAVISCON) 80-20 MG CHEW chewable tablet Chew by mouth.   Taking  . Prenatal Vit-Fe Fumarate-FA (MULTIVITAMIN-PRENATAL) 27-0.8 MG TABS tablet Take 1 tablet by mouth daily at 12 noon.   Not Taking   Allergies: has No Known Allergies. OBHx:  OB History  Gravida Para Term Preterm AB SAB TAB Ectopic Multiple Living  1             # Outcome Date GA Lbr Len/2nd Weight Sex Delivery Anes PTL Lv  1 Current              XBM:[id number] except as detailed in HPI. Soc Hx: Pregnancy welcomed  Objective:   Filed Vitals:   07/09/15 1951  BP: 124/67  Pulse: 105  Temp:   Resp:    General: Well nourished, well developed female in no acute distress.  Skin: Warm and dry.  Cardiovascular:Regular rate and rhythm. Respiratory: Clear to auscultation bilateral. Normal respiratory effort Abdomen: no pain Neuro/Psych: Normal mood and affect.  EFM:FHR: 140 bpm, variability: moderate,  accelerations:  Present,  decelerations:  Absent Toco: None   Assessment & Plan:   19 y.o. G1P0 @ [redacted]w[redacted]d, Admitted on 07/09/2015:DFM. FWR.     Discharge Home and Fetal Wellbeing Reassuring

## 2015-07-12 ENCOUNTER — Inpatient Hospital Stay: Payer: Medicaid Other | Admitting: Anesthesiology

## 2015-07-12 ENCOUNTER — Encounter: Payer: Self-pay | Admitting: *Deleted

## 2015-07-12 ENCOUNTER — Inpatient Hospital Stay
Admission: EM | Admit: 2015-07-12 | Discharge: 2015-07-15 | DRG: 775 | Disposition: A | Discharge status: Home / Self Care | Payer: Medicaid Other | Attending: Obstetrics and Gynecology | Admitting: Obstetrics and Gynecology

## 2015-07-12 DIAGNOSIS — Z3490 Encounter for supervision of normal pregnancy, unspecified, unspecified trimester: Secondary | ICD-10-CM

## 2015-07-12 DIAGNOSIS — Z833 Family history of diabetes mellitus: Secondary | ICD-10-CM | POA: Diagnosis not present

## 2015-07-12 DIAGNOSIS — O133 Gestational [pregnancy-induced] hypertension without significant proteinuria, third trimester: Principal | ICD-10-CM | POA: Diagnosis present

## 2015-07-12 DIAGNOSIS — Z3A38 38 weeks gestation of pregnancy: Secondary | ICD-10-CM | POA: Diagnosis present

## 2015-07-12 DIAGNOSIS — Z8249 Family history of ischemic heart disease and other diseases of the circulatory system: Secondary | ICD-10-CM | POA: Diagnosis not present

## 2015-07-12 DIAGNOSIS — Z809 Family history of malignant neoplasm, unspecified: Secondary | ICD-10-CM

## 2015-07-12 DIAGNOSIS — E669 Obesity, unspecified: Secondary | ICD-10-CM | POA: Diagnosis present

## 2015-07-12 LAB — COMPREHENSIVE METABOLIC PANEL
ALT: 12 U/L — AB (ref 14–54)
ANION GAP: 8 (ref 5–15)
AST: 19 U/L (ref 15–41)
Albumin: 2.7 g/dL — ABNORMAL LOW (ref 3.5–5.0)
Alkaline Phosphatase: 153 U/L — ABNORMAL HIGH (ref 38–126)
BUN: 7 mg/dL (ref 6–20)
CALCIUM: 8.7 mg/dL — AB (ref 8.9–10.3)
CO2: 22 mmol/L (ref 22–32)
Chloride: 107 mmol/L (ref 101–111)
Creatinine, Ser: 0.39 mg/dL — ABNORMAL LOW (ref 0.44–1.00)
GLUCOSE: 87 mg/dL (ref 65–99)
Potassium: 4.3 mmol/L (ref 3.5–5.1)
Sodium: 137 mmol/L (ref 135–145)
Total Bilirubin: <0.1 mg/dL — ABNORMAL LOW (ref 0.3–1.2)
Total Protein: 6.3 g/dL — ABNORMAL LOW (ref 6.5–8.1)

## 2015-07-12 LAB — CBC
HEMATOCRIT: 32.1 % — AB (ref 35.0–47.0)
HEMOGLOBIN: 10.5 g/dL — AB (ref 12.0–16.0)
MCH: 25.9 pg — AB (ref 26.0–34.0)
MCHC: 32.7 g/dL (ref 32.0–36.0)
MCV: 79.4 fL — AB (ref 80.0–100.0)
PLATELETS: 216 10*3/uL (ref 150–440)
RBC: 4.04 MIL/uL (ref 3.80–5.20)
RDW: 15.7 % — AB (ref 11.5–14.5)
WBC: 14.6 10*3/uL — ABNORMAL HIGH (ref 3.6–11.0)

## 2015-07-12 LAB — PROTEIN / CREATININE RATIO, URINE
Creatinine, Urine: 74 mg/dL
PROTEIN CREATININE RATIO: 0.27 mg/mg{creat} — AB (ref 0.00–0.15)
Total Protein, Urine: 20 mg/dL

## 2015-07-12 LAB — GLUCOSE, CAPILLARY
GLUCOSE-CAPILLARY: 115 mg/dL — AB (ref 65–99)
GLUCOSE-CAPILLARY: 87 mg/dL (ref 65–99)
Glucose-Capillary: 117 mg/dL — ABNORMAL HIGH (ref 65–99)
Glucose-Capillary: 77 mg/dL (ref 65–99)
Glucose-Capillary: 77 mg/dL (ref 65–99)

## 2015-07-12 MED ORDER — OXYTOCIN 40 UNITS IN LACTATED RINGERS INFUSION - SIMPLE MED
INTRAVENOUS | Status: AC
  Administered 2015-07-12: 1 m[IU]/min via INTRAVENOUS
  Filled 2015-07-12: qty 1000

## 2015-07-12 MED ORDER — ONDANSETRON HCL 4 MG/2ML IJ SOLN
4.0000 mg | Freq: Four times a day (QID) | INTRAMUSCULAR | Status: DC | PRN
  Administered 2015-07-12: 4 mg via INTRAVENOUS
  Filled 2015-07-12: qty 2

## 2015-07-12 MED ORDER — LACTATED RINGERS IV SOLN
500.0000 mL | INTRAVENOUS | Status: DC | PRN
  Administered 2015-07-12: 500 mL via INTRAVENOUS

## 2015-07-12 MED ORDER — OXYTOCIN BOLUS FROM INFUSION
500.0000 mL | INTRAVENOUS | Status: DC
  Administered 2015-07-13: 500 mL via INTRAVENOUS

## 2015-07-12 MED ORDER — LABETALOL HCL 5 MG/ML IV SOLN: 20.0000 mg | INTRAVENOUS | Status: DC | PRN

## 2015-07-12 MED ORDER — FENTANYL 2.5 MCG/ML W/ROPIVACAINE 0.2% IN NS 100 ML EPIDURAL INFUSION (ARMC-ANES)
EPIDURAL | Status: AC
  Administered 2015-07-12: 250 ug
  Filled 2015-07-12: qty 100

## 2015-07-12 MED ORDER — LIDOCAINE-EPINEPHRINE (PF) 1.5 %-1:200000 IJ SOLN
INTRAMUSCULAR | Status: DC | PRN
  Administered 2015-07-12: 3 mL via PERINEURAL

## 2015-07-12 MED ORDER — OXYTOCIN 40 UNITS IN LACTATED RINGERS INFUSION - SIMPLE MED
62.5000 mL/h | INTRAVENOUS | Status: DC
  Administered 2015-07-13: 62.5 mL/h via INTRAVENOUS

## 2015-07-12 MED ORDER — LACTATED RINGERS IV SOLN
INTRAVENOUS | Status: DC
  Administered 2015-07-12 (×2): via INTRAVENOUS

## 2015-07-12 MED ORDER — BUPIVACAINE HCL (PF) 0.25 % IJ SOLN
INTRAMUSCULAR | Status: DC | PRN
  Administered 2015-07-12: 10 mL

## 2015-07-12 MED ORDER — OXYTOCIN 40 UNITS IN LACTATED RINGERS INFUSION - SIMPLE MED
1.0000 m[IU]/min | INTRAVENOUS | Status: DC
  Administered 2015-07-12: 1 m[IU]/min via INTRAVENOUS

## 2015-07-12 MED ORDER — FENTANYL 2.5 MCG/ML W/ROPIVACAINE 0.2% IN NS 100 ML EPIDURAL INFUSION (ARMC-ANES)
EPIDURAL | Status: DC | PRN
  Administered 2015-07-12: 9 mL/h via EPIDURAL

## 2015-07-12 MED ORDER — TERBUTALINE SULFATE 1 MG/ML IJ SOLN
0.2500 mg | Freq: Once | INTRAMUSCULAR | Status: DC | PRN
  Filled 2015-07-12: qty 1

## 2015-07-12 MED ORDER — CITRIC ACID-SODIUM CITRATE 334-500 MG/5ML PO SOLN: 30.0000 mL | ORAL | Status: DC | PRN

## 2015-07-12 MED ORDER — LIDOCAINE HCL (PF) 1 % IJ SOLN
30.0000 mL | INTRAMUSCULAR | Status: DC | PRN
  Filled 2015-07-12: qty 30

## 2015-07-12 MED ORDER — HYDRALAZINE HCL 20 MG/ML IJ SOLN: 10.0000 mg | Freq: Once | INTRAMUSCULAR | Status: DC | PRN

## 2015-07-12 NOTE — Progress Notes (Signed)
Several BPs in mild range. PC Ratio 270, labs otherwise normal.  O: 5/80/-1 per Dr Jolyn Nap exam. Category 1 tracing: 140 with moderate variability, +accels, no decels. Rare ctx A: IUP at [redacted]w[redacted]d with GHTN P: IOL with Pitocin and AROM when appropriate.  Pt may have regular diet & shower first  EFW: 40% at [redacted]w[redacted]d - FSBS q 2 hrs in active labor

## 2015-07-12 NOTE — Anesthesia Preprocedure Evaluation (Signed)
Anesthesia Evaluation  Patient identified by MRN, date of birth, ID band Patient awake    Reviewed: Allergy & Precautions, NPO status , Patient's Chart, lab work & pertinent test results  Airway Mallampati: II  TM Distance: >3 FB     Dental no notable dental hx.    Pulmonary neg pulmonary ROS,  breath sounds clear to auscultation  Pulmonary exam normal       Cardiovascular negative cardio ROS Normal cardiovascular exam HTN with pregnancy   Neuro/Psych negative neurological ROS  negative psych ROS   GI/Hepatic negative GI ROS, Neg liver ROS,   Endo/Other  negative endocrine ROS  Renal/GU negative Renal ROS  negative genitourinary   Musculoskeletal   Abdominal Normal abdominal exam  (+) + obese,   Peds negative pediatric ROS (+)  Hematology   Anesthesia Other Findings   Reproductive/Obstetrics (+) Pregnancy                             Anesthesia Physical Anesthesia Plan  ASA: II  Anesthesia Plan: Epidural   Post-op Pain Management:    Induction:   Airway Management Planned: Natural Airway  Additional Equipment:   Intra-op Plan:   Post-operative Plan:   Informed Consent: I have reviewed the patients History and Physical, chart, labs and discussed the procedure including the risks, benefits and alternatives for the proposed anesthesia with the patient or authorized representative who has indicated his/her understanding and acceptance.   Dental advisory given  Plan Discussed with: Surgeon  Anesthesia Plan Comments:         Anesthesia Quick Evaluation

## 2015-07-12 NOTE — Anesthesia Procedure Notes (Signed)
Epidural Patient location during procedure: OB Start time: 07/12/2015 9:54 PM End time: 07/12/2015 10:00 PM  Staffing Anesthesiologist: Yves Dill Performed by: anesthesiologist   Preanesthetic Checklist Completed: patient identified, site marked, surgical consent, pre-op evaluation, timeout performed, IV checked, risks and benefits discussed and monitors and equipment checked  Epidural Patient position: sitting Prep: Betadine and site prepped and draped Patient monitoring: heart rate, cardiac monitor, continuous pulse ox and blood pressure Approach: midline Location: L3-L4 Injection technique: LOR air  Needle:  Needle type: Tuohy  Needle gauge: 18 G Needle length: 9 cm Catheter type: closed end flexible Catheter size: 20 Guage Test dose: negative and 1.5% lidocaine with Epi 1:200 K  Assessment Sensory level: T8  Additional Notes Time out called. Patient placed in sitting position.  Prepped and draped in sterile fashion.  A skin wheal was made at the L3-L4 interspace. A tuohy needle was advanced easily to the epidural space by the loss of R' technique.  Catheter threaded easily with a neg test dose.   Very slight L sided paresthesia with thread of catheter that resolved quickly with pulling catheter back to 3 cm.  Pt tolerated the procedure well.

## 2015-07-12 NOTE — OB Triage Note (Signed)
Pt came from [facility] with elevated BP, 130s-140s/80s-90s. Gestational hypertension workup ordered

## 2015-07-12 NOTE — Progress Notes (Signed)
Subjective:  Feeling some contractions, not painfull  Objective:   Filed Vitals:   07/12/15 1732 07/12/15 1759 07/12/15 1802 07/12/15 1954  BP: 137/79  127/82   Pulse: 119  127   Temp:  98.6 F (37 C)  98.4 F (36.9 C)  TempSrc:  Oral  Oral  Resp:    18  Height:      Weight:       General: NAD Abdomen: gravid, soft Cervical Exam:  Dilation: 5 Effacement (%): 70 Cervical Position: Middle Station: -1 Presentation: Vertex Exam by:: Delice Lesch, MD AROM clear  FHT: 150, moderate, positive accels, no decels Toco: q49min  Results for orders placed or performed during the hospital encounter of 07/12/15 (from the past 24 hour(s))  Glucose, capillary     Status: Abnormal   Collection Time: 07/12/15 10:02 AM  Result Value Ref Range   Glucose-Capillary 117 (H) 65 - 99 mg/dL  Protein / creatinine ratio, urine     Status: Abnormal   Collection Time: 07/12/15 10:57 AM  Result Value Ref Range   Creatinine, Urine 74 mg/dL   Total Protein, Urine 20 mg/dL   Protein Creatinine Ratio 0.27 (H) 0.00 - 0.15 mg/mg[Cre]  Comprehensive metabolic panel     Status: Abnormal   Collection Time: 07/12/15 11:43 AM  Result Value Ref Range   Sodium 137 135 - 145 mmol/L   Potassium 4.3 3.5 - 5.1 mmol/L   Chloride 107 101 - 111 mmol/L   CO2 22 22 - 32 mmol/L   Glucose, Bld 87 65 - 99 mg/dL   BUN 7 6 - 20 mg/dL   Creatinine, Ser 1.61 (L) 0.44 - 1.00 mg/dL   Calcium 8.7 (L) 8.9 - 10.3 mg/dL   Total Protein 6.3 (L) 6.5 - 8.1 g/dL   Albumin 2.7 (L) 3.5 - 5.0 g/dL   AST 19 15 - 41 U/L   ALT 12 (L) 14 - 54 U/L   Alkaline Phosphatase 153 (H) 38 - 126 U/L   Total Bilirubin <0.1 (L) 0.3 - 1.2 mg/dL   GFR calc non Af Amer >60 >60 mL/min   GFR calc Af Amer >60 >60 mL/min   Anion gap 8 5 - 15  CBC     Status: Abnormal   Collection Time: 07/12/15 11:43 AM  Result Value Ref Range   WBC 14.6 (H) 3.6 - 11.0 K/uL   RBC 4.04 3.80 - 5.20 MIL/uL   Hemoglobin 10.5 (L) 12.0 - 16.0 g/dL   HCT 09.6 (L) 04.5 -  47.0 %   MCV 79.4 (L) 80.0 - 100.0 fL   MCH 25.9 (L) 26.0 - 34.0 pg   MCHC 32.7 32.0 - 36.0 g/dL   RDW 40.9 (H) 81.1 - 91.4 %   Platelets 216 150 - 440 K/uL  Glucose, capillary     Status: Abnormal   Collection Time: 07/12/15  5:35 PM  Result Value Ref Range   Glucose-Capillary 115 (H) 65 - 99 mg/dL  Glucose, capillary     Status: None   Collection Time: 07/12/15  7:57 PM  Result Value Ref Range   Glucose-Capillary 77 65 - 99 mg/dL    Assessment:   19 y.o. G1P0 [redacted]w[redacted]d IOL GHTN, and GDM undergoing induction for the former  Plan:  1) Labor  - con't pitocin - AROM clear  2) Fetus - cat I tracing  3) GHTN - BP normotensive to mild range  4) GDM - BG 2-hr

## 2015-07-12 NOTE — H&P (Signed)
Obstetric History and Physical  Gina Gomez is a 19 y.o. G1P0 with Estimated Date of Delivery: 07/24/15 per LMP and 6 wk [place] who presents at [redacted]w[redacted]d for evaluation of elevated BP. Pt was seen 2 days ago with BP of 134/80 and returned today for f/u. BPs were 140/90, 133/101 and 140/90. Pt reports headache all day yesterday which resolved with rest, she did not take any tylenol.  Patient states she has been having rare contractions, no vaginal bleeding, intact membranes, with active fetal movement.    Prenatal Course Source of Care: [facility]  with onset of care at 8 weeks Pregnancy complications or risks: A1GDM per elevated 3 hour glucose  She plans to breastfeed She desires minipill or depo for postpartum contraception.   Prenatal labs and studies: ABO, Rh: O+  Antibody: neg Rubella: Immune Varicella: Non-immune RPR:  NR HBsAg:  neg HIV: neg GC/CT: neg/neg GBS: neg 1 hr Glucola: 3 hr: 3 elevated values   Genetic screening: negative 1st trimester (risk 1: 410)    Prenatal Transfer Tool   Past Medical History  Diagnosis Date  . Medical history non-contributory   . Obesity (BMI 35.0-39.9 without comorbidity)     Past Surgical History  Procedure Laterality Date  . No past surgeries      OB History  Gravida Para Term Preterm AB SAB TAB Ectopic Multiple Living  1             # Outcome Date GA Lbr Len/2nd Weight Sex Delivery Anes PTL Lv  1 Current               Social History   Social History  . Marital Status: Single    Spouse Name: N/A  . Number of Children: N/A  . Years of Education: N/A   Social History Main Topics  . Smoking status: Never Smoker   . Smokeless tobacco: Never Used  . Alcohol Use: No  . Drug Use: No  . Sexual Activity: Yes   Other Topics Concern  . None   Social History Narrative    Family History  Problem Relation Age of Onset  . Cancer Maternal Grandmother   . Hypertension Maternal Grandfather   . Diabetes Maternal Grandfather   .  Heart disease Maternal Grandfather     Prescriptions prior to admission  Medication Sig Dispense Refill Last Dose  . calcium carbonate (TUMS EX) 750 MG chewable tablet Chew 1 tablet by mouth daily.       No Known Allergies  Review of Systems: Negative except for what is mentioned in HPI.  Physical Exam: BP: 118-144/74-90 mmHg  Pulse 109  LMP 10/17/2014, random glucose: 117 approx 2 hrs postprandial GENERAL: Well-developed, well-nourished female in no acute distress.  LUNGS: Clear to auscultation bilaterally.  HEART: Regular rate and rhythm. ABDOMEN: Soft, nontender, nondistended, gravid. EXTREMITIES: Nontender, no edema FHT: Category: 1 Baseline rate 140 bpm   Variability moderate  Accelerations present   Decelerations none Contractions: rare   Pertinent Labs/Studies:   Results for orders placed or performed during the hospital encounter of 07/12/15 (from the past 24 hour(s))  Glucose, capillary     Status: Abnormal   Collection Time: 07/12/15 10:02 AM  Result Value Ref Range   Glucose-Capillary 117 (H) 65 - 99 mg/dL  Protein / creatinine ratio, urine     Status: Abnormal   Collection Time: 07/12/15 10:57 AM  Result Value Ref Range   Creatinine, Urine 74 mg/dL   Total Protein, Urine 20  mg/dL   Protein Creatinine Ratio 0.27 (H) 0.00 - 0.15 mg/mg[Cre]    Assessment : IUP at [redacted]w[redacted]d with mildly elevated BP reading - r/o GHTN or pre-eclampsia  Plan: Awaiting labs

## 2015-07-13 LAB — CBC
HCT: 30.9 % — ABNORMAL LOW (ref 35.0–47.0)
Hemoglobin: 9.6 g/dL — ABNORMAL LOW (ref 12.0–16.0)
MCH: 25.1 pg — AB (ref 26.0–34.0)
MCHC: 31 g/dL — AB (ref 32.0–36.0)
MCV: 80.8 fL (ref 80.0–100.0)
Platelets: 188 10*3/uL (ref 150–440)
RBC: 3.82 MIL/uL (ref 3.80–5.20)
RDW: 15.8 % — AB (ref 11.5–14.5)
WBC: 18.4 10*3/uL — AB (ref 3.6–11.0)

## 2015-07-13 LAB — GLUCOSE, CAPILLARY: Glucose-Capillary: 92 mg/dL (ref 65–99)

## 2015-07-13 MED ORDER — BENZOCAINE-MENTHOL 20-0.5 % EX AERO
1.0000 "application " | INHALATION_SPRAY | CUTANEOUS | Status: DC | PRN
  Filled 2015-07-13: qty 56

## 2015-07-13 MED ORDER — PRENATAL MULTIVITAMIN CH
1.0000 | ORAL_TABLET | Freq: Every day | ORAL | Status: DC
  Administered 2015-07-13 – 2015-07-15 (×3): 1 via ORAL
  Filled 2015-07-13 (×3): qty 1

## 2015-07-13 MED ORDER — AMMONIA AROMATIC IN INHA
RESPIRATORY_TRACT | Status: AC
  Filled 2015-07-13: qty 10

## 2015-07-13 MED ORDER — DIPHENHYDRAMINE HCL 50 MG/ML IJ SOLN: 12.5000 mg | INTRAMUSCULAR | Status: DC | PRN

## 2015-07-13 MED ORDER — SIMETHICONE 80 MG PO CHEW: 80.0000 mg | CHEWABLE_TABLET | ORAL | Status: DC | PRN

## 2015-07-13 MED ORDER — DIPHENHYDRAMINE HCL 25 MG PO CAPS: 25.0000 mg | ORAL_CAPSULE | Freq: Four times a day (QID) | ORAL | Status: DC | PRN

## 2015-07-13 MED ORDER — DIBUCAINE 1 % RE OINT: 1.0000 "application " | TOPICAL_OINTMENT | RECTAL | Status: DC | PRN

## 2015-07-13 MED ORDER — WITCH HAZEL-GLYCERIN EX PADS: 1.0000 "application " | MEDICATED_PAD | CUTANEOUS | Status: DC | PRN

## 2015-07-13 MED ORDER — EPHEDRINE 5 MG/ML INJ
10.0000 mg | INTRAVENOUS | Status: DC | PRN
  Filled 2015-07-13: qty 2

## 2015-07-13 MED ORDER — OXYTOCIN 10 UNIT/ML IJ SOLN
INTRAMUSCULAR | Status: AC
  Filled 2015-07-13: qty 2

## 2015-07-13 MED ORDER — SENNOSIDES-DOCUSATE SODIUM 8.6-50 MG PO TABS
2.0000 | ORAL_TABLET | ORAL | Status: DC
  Administered 2015-07-14 (×2): 2 via ORAL
  Filled 2015-07-13 (×2): qty 2

## 2015-07-13 MED ORDER — PHENYLEPHRINE 40 MCG/ML (10ML) SYRINGE FOR IV PUSH (FOR BLOOD PRESSURE SUPPORT)
80.0000 ug | PREFILLED_SYRINGE | INTRAVENOUS | Status: DC | PRN
  Filled 2015-07-13: qty 2

## 2015-07-13 MED ORDER — MEASLES, MUMPS & RUBELLA VAC ~~LOC~~ INJ: 0.5000 mL | INJECTION | Freq: Once | SUBCUTANEOUS | Status: DC

## 2015-07-13 MED ORDER — ACETAMINOPHEN 325 MG PO TABS: 650.0000 mg | ORAL_TABLET | ORAL | Status: DC | PRN

## 2015-07-13 MED ORDER — LANOLIN HYDROUS EX OINT: TOPICAL_OINTMENT | CUTANEOUS | Status: DC | PRN

## 2015-07-13 MED ORDER — IBUPROFEN 600 MG PO TABS
600.0000 mg | ORAL_TABLET | Freq: Four times a day (QID) | ORAL | Status: DC
  Administered 2015-07-13 – 2015-07-15 (×6): 600 mg via ORAL
  Filled 2015-07-13 (×10): qty 1

## 2015-07-13 MED ORDER — MISOPROSTOL 200 MCG PO TABS
ORAL_TABLET | ORAL | Status: AC
  Filled 2015-07-13: qty 4

## 2015-07-13 MED ORDER — FENTANYL 2.5 MCG/ML W/ROPIVACAINE 0.2% IN NS 100 ML EPIDURAL INFUSION (ARMC-ANES)
9.0000 mL/h | EPIDURAL | Status: DC
Start: 2015-07-13 — End: 2015-07-14

## 2015-07-13 MED ORDER — ONDANSETRON HCL 4 MG PO TABS: 4.0000 mg | ORAL_TABLET | ORAL | Status: DC | PRN

## 2015-07-13 MED ORDER — TETANUS-DIPHTH-ACELL PERTUSSIS 5-2.5-18.5 LF-MCG/0.5 IM SUSP: 0.5000 mL | Freq: Once | INTRAMUSCULAR | Status: DC

## 2015-07-13 MED ORDER — ONDANSETRON HCL 4 MG/2ML IJ SOLN: 4.0000 mg | INTRAMUSCULAR | Status: DC | PRN

## 2015-07-13 MED ORDER — LIDOCAINE HCL (PF) 1 % IJ SOLN
INTRAMUSCULAR | Status: AC
  Filled 2015-07-13: qty 30

## 2015-07-13 NOTE — Progress Notes (Signed)
Blood sugar checked 2 hours after lunch per MD order with result of 106. Reynold Bowen, RN 07/13/2015 6:15 PM

## 2015-07-13 NOTE — Progress Notes (Signed)
POCT blood sugar result is 170 at 1100 on 07/13/15.  Dr. Vergie Living notified.  No further orders received. Reynold Bowen, RN 07/13/2015 11:31 AM

## 2015-07-13 NOTE — Discharge Summary (Signed)
Obstetric Discharge Summary Reason for Admission: induction of labor and gestational hypertension Prenatal Procedures: NST Intrapartum Procedures: spontaneous vaginal delivery Postpartum Procedures: none Complications-Operative and Postpartum: none HEMOGLOBIN  Date Value Ref Range Status  07/12/2015 10.5* 12.0 - 16.0 g/dL Final   HCT  Date Value Ref Range Status  07/12/2015 32.1* 35.0 - 47.0 % Final   Postpartum Hct: 30.9  Physical Exam:  General: alert and cooperative Lochia: appropriate Uterine Fundus: firm Incision: N/A DVT Evaluation: No evidence of DVT seen on physical exam.  Discharge Diagnoses: Term Pregnancy-delivered  Discharge Information: Date: 07/13/2015 Activity: pelvic rest Diet: routine Medications: PNV and Ibuprofen Condition: stable Instructions: Discharge instructions:   Call office if you have any of the following: headache, visual changes, fever >100 F, chills, breast concerns, excessive vaginal bleeding, incision drainage or problems, leg pain or redness, depression or any other concerns.   Activity: Do not lift > 10 lbs for 6 weeks.  No intercourse or tampons for 6 weeks.  No driving for 1-2 weeks.   Discharge to: home  Follow-up: 07/22/15 at 10:30 am with Dr Tiburcio Pea at [facility] office for BP check  Newborn Data: Live born female  Birth Weight:  6# 5.6 oz APGAR:9, 9  Home with mother.  Marta Antu, CNM

## 2015-07-13 NOTE — Progress Notes (Signed)
NBS is 90-Three Hours after [employer]. Pt.is alert and oriented and denies c/o. Pt. Is smiling and visiting with Family.

## 2015-07-13 NOTE — Progress Notes (Addendum)
Daily Post Partum Note  Gina Gomez is a 19 y.o. G1P1001 PPD#0 s/p SVD/1st  @ [redacted]w[redacted]d Pregnancy c/b poorly compliant GDMA1. IOL for labor and gHTN  24hr/overnight events:  none  Subjective:  No s/s of pre-eclampsia. Normal and decreasing lochia.   Objective:     Current Vital Signs 24h Vital Sign Ranges  T 99 F (37.2 C) Temp  Avg: 98.3 F (36.8 C)  Min: 97.9 F (36.6 C)  Max: 99 F (37.2 C)  BP (!) 137/48 mmHg BP  Min: 95/62  Max: 157/86  HR (!) 112 Pulse  Avg: 108.2  Min: 97  Max: 127  RR 16 Resp  Avg: 17.6  Min: 16  Max: 18  SaO2 98 % Not Delivered SpO2  Avg: 99 %  Min: 98 %  Max: 100 %       24 Hour I/O Current Shift I/O  Time Ins Outs 08/12 0701 - 08/13 0700 In: -  Out: 400 [Urine:400]      General: NAD Abdomen: FF below the umbilicus, soft, NTTP Perineum: deferred Skin:  Warm and dry.  Cardiovascular:Regular rate and rhythm. Respiratory:  Clear to auscultation bilateral. Normal respiratory effort Extremities: no c/c/e  Medications Current Facility-Administered Medications  Medication Dose Route Frequency Provider Last Rate Last Dose  . acetaminophen (TYLENOL) tablet 650 mg  650 mg Oral Q4H PRN AMalachy Mood MD      . benzocaine-Menthol (DERMOPLAST) 20-0.5 % topical spray 1 application  1 application Topical PRN AMalachy Mood MD      . witch hazel-glycerin (TUCKS) pad 1 application  1 application Topical PRN AMalachy Mood MD       And  . dibucaine (NUPERCAINAL) 1 % rectal ointment 1 application  1 application Rectal PRN AMalachy Mood MD      . diphenhydrAMINE (BENADRYL) capsule 25 mg  25 mg Oral Q6H PRN AMalachy Mood MD      . diphenhydrAMINE (BENADRYL) injection 12.5 mg  12.5 mg Intravenous Q15 min PRN PAlvin Critchley MD      . ePHEDrine injection 10 mg  10 mg Intravenous PRN PAlvin Critchley MD      . fentaNYL 2.5 mcg/ml w/ropivacaine 0.2% (preservative free) in normal saline 100 mL EPIDURAL Infusion in 150 ml Intravia Bag  9 mL/hr Epidural  Continuous PAlvin Critchley MD      . ibuprofen (ADVIL,MOTRIN) tablet 600 mg  600 mg Oral 4 times per day AMalachy Mood MD   600 mg at 07/13/15 0640  . lanolin ointment   Topical PRN AMalachy Mood MD      . ondansetron ([facility] tablet 4 mg  4 mg Oral Q4H PRN AMalachy Mood MD       Or  . ondansetron ([facility] injection 4 mg  4 mg Intravenous Q4H PRN AMalachy Mood MD      . PHENYLephrine 40 mcg/ml Adult IVPush Syringe  80 mcg Intravenous PRN PAlvin Critchley MD      . prenatal multivitamin tablet 1 tablet  1 tablet Oral Q1200 AMalachy Mood MD   1 tablet at 07/13/15 0825  . [START ON 07/14/2015] senna-docusate (Senokot-S) tablet 2 tablet  2 tablet Oral Q24H AMalachy Mood MD      . simethicone ([facility] chewable tablet 80 mg  80 mg Oral PRN AMalachy Mood MD      . [Derrill MemoON 07/14/2015] Tdap (BOOSTRIX) injection 0.5 mL  0.5 mL Intramuscular Once AMalachy Mood MD       Facility-Administered Medications Ordered in Other Encounters  Medication Dose Route Frequency Provider Last Rate Last Dose  . bupivacaine (PF) (MARCAINE) 0.25 % injection    Anesthesia Intra-op Alvin Critchley, MD   10 mL at 07/12/15 2206  . fentaNYL 2.5 mcg/ml w/ropivacaine 0.2% (preservative free) in normal saline 100 mL EPIDURAL Infusion in 150 ml Intravia Bag   Epidural Continuous PRN Alvin Critchley, MD 9 mL/hr at 07/12/15 2250 9 mL/hr at 07/12/15 2250  . lidocaine-EPINEPHrine 1.5 %-1:200000 injection    Anesthesia Intra-op Alvin Critchley, MD   3 mL at 07/12/15 2200     Recent Labs Lab 07/12/15 1143 07/13/15 0526  WBC 14.6* 18.4*  HGB 10.5* 9.6*  HCT 32.1* 30.9*  PLT 216 188    Recent Labs Lab 07/12/15 1143  NA 137  K 4.3  CL 107  CO2 22  BUN 7  CREATININE 0.39*  GLUCOSE 87  CALCIUM 8.7*   Results for Gina Gomez, Gina Gomez (MRN [id number]) as of 07/13/2015 08:31  Ref. Range 07/12/2015 19:57 07/12/2015 21:28 07/12/2015 23:58 07/13/2015 02:22  Glucose-Capillary Latest Ref Range: 65-99 mg/dL 77 77 87 92    Radiology: none  Assessment & Plan:  Pt doing well *Postpartum/postop: routine care -MMR ordered -f/u rpr *GDM: will do checks while in house, am fasthing and 2hr PP *gHTN: just mild range BPs; will continue to follow *Dispo: likely tomorrow or monday  O POS / Rubella Not immune / RPR not seen so ordered / HIV negative / HepBsAg negative / Tdap UTD: Yes/pap needed PP / Breast  / BC: undecided / Follow up: Birder Robson. MD [facility] OBGYN Pager [phone]

## 2015-07-14 LAB — GLUCOSE, CAPILLARY
Glucose-Capillary: 83 mg/dL (ref 65–99)
Glucose-Capillary: 101 mg/dL — ABNORMAL HIGH (ref 65–99)
Glucose-Capillary: 124 mg/dL — ABNORMAL HIGH (ref 65–99)
Glucose-Capillary: 92 mg/dL (ref 65–99)

## 2015-07-14 LAB — SYPHILIS: RPR W/REFLEX TO RPR TITER AND TREPONEMAL ANTIBODIES, TRADITIONAL SCREENING AND DIAGNOSIS ALGORITHM: RPR Ser Ql: NONREACTIVE

## 2015-07-14 NOTE — Progress Notes (Signed)
NBS is 83.

## 2015-07-14 NOTE — Anesthesia Postprocedure Evaluation (Signed)
  Anesthesia Post-op Note  Patient: Gina Gomez  Procedure(s) Performed: * No procedures listed *  Anesthesia type:Epidural  Patient location: PACU  Post pain: Pain level controlled  Post assessment: Post-op Vital signs reviewed, Patient's Cardiovascular Status Stable, Respiratory Function Stable, Patent Airway and No signs of Nausea or vomiting  Post vital signs: Reviewed and stable  Last Vitals:  Filed Vitals:   07/14/15 0841  BP: 111/54  Pulse: 95  Temp: 36.6 C  Resp: 18    Level of consciousness: awake, alert  and patient cooperative  Complications: No apparent anesthesia complications

## 2015-07-14 NOTE — Progress Notes (Signed)
Daily Post Partum Note  Gina Gomez is a 19 y.o. G1P1001 PPD#1 s/p SVD/1st  @ [redacted]w[redacted]d Pregnancy c/b poorly compliant GDMA1. IOL for labor and gHTN  24hr/overnight events:  none  Subjective:  Meeting all PP goals  Objective:     Current Vital Signs 24h Vital Sign Ranges  T 97.8 F (36.6 C) Temp  Avg: 98 F (36.7 C)  Min: 97.6 F (36.4 C)  Max: 98.7 F (37.1 C)  BP (!) 115/49 mmHg BP  Min: 115/49  Max: 137/78  HR 89 Pulse  Avg: 107.4  Min: 89  Max: 114  RR 20 Resp  Avg: 19.2  Min: 18  Max: 20  SaO2 99 % Not Delivered SpO2  Avg: 99.5 %  Min: 99 %  Max: 100 %       24 Hour I/O Current Shift I/O  Time Ins Outs 08/13 0701 - 08/14 0700 In: 500 [I.V.:500] Out: 900 [Urine:900]      General: NAD Abdomen: FF below the umbilicus, soft, NTTP Perineum: deferred Skin:  Warm and dry.  Respiratory:  Normal respiratory effort  Medications Current Facility-Administered Medications  Medication Dose Route Frequency Provider Last Rate Last Dose  . acetaminophen (TYLENOL) tablet 650 mg  650 mg Oral Q4H PRN AMalachy Mood MD      . benzocaine-Menthol (DERMOPLAST) 20-0.5 % topical spray 1 application  1 application Topical PRN AMalachy Mood MD      . witch hazel-glycerin (TUCKS) pad 1 application  1 application Topical PRN AMalachy Mood MD       And  . dibucaine (NUPERCAINAL) 1 % rectal ointment 1 application  1 application Rectal PRN AMalachy Mood MD      . diphenhydrAMINE (BENADRYL) capsule 25 mg  25 mg Oral Q6H PRN AMalachy Mood MD      . ibuprofen (ADVIL,MOTRIN) tablet 600 mg  600 mg Oral 4 times per day AMalachy Mood MD   600 mg at 07/14/15 0547  . lanolin ointment   Topical PRN AMalachy Mood MD      . measles, mumps and rubella vaccine (MMR) injection 0.5 mL  0.5 mL Subcutaneous Once CAletha Halim MD      . ondansetron ([facility] tablet 4 mg  4 mg Oral Q4H PRN AMalachy Mood MD       Or  . ondansetron ([facility] injection 4 mg  4 mg Intravenous Q4H PRN  AMalachy Mood MD      . PHENYLephrine 40 mcg/ml Adult IVPush Syringe  80 mcg Intravenous PRN PAlvin Critchley MD      . prenatal multivitamin tablet 1 tablet  1 tablet Oral Q1200 AMalachy Mood MD   1 tablet at 07/13/15 0825  . senna-docusate (Senokot-S) tablet 2 tablet  2 tablet Oral Q24H AMalachy Mood MD   2 tablet at 07/14/15 0016  . simethicone (MYLICON) chewable tablet 80 mg  80 mg Oral PRN AMalachy Mood MD      . Tdap (Durwin Reges injection 0.5 mL  0.5 mL Intramuscular Once AMalachy Mood MD       Facility-Administered Medications Ordered in Other Encounters  Medication Dose Route Frequency Provider Last Rate Last Dose  . bupivacaine (PF) (MARCAINE) 0.25 % injection    Anesthesia Intra-op PAlvin Critchley MD   10 mL at 07/12/15 2206  . fentaNYL 2.5 mcg/ml w/ropivacaine 0.2% (preservative free) in normal saline 100 mL EPIDURAL Infusion in 150 ml Intravia Bag   Epidural Continuous PRN PAlvin Critchley MD 9 mL/hr at 07/12/15 2250 9 mL/hr  at 07/12/15 2250  . lidocaine-EPINEPHrine 1.5 %-1:200000 injection    Anesthesia Intra-op Alvin Critchley, MD   3 mL at 07/12/15 2200     Recent Labs Lab 07/12/15 1143 07/13/15 0526  WBC 14.6* 18.4*  HGB 10.5* 9.6*  HCT 32.1* 30.9*  PLT 216 188    Recent Labs Lab 07/12/15 1143  NA 137  K 4.3  CL 107  CO2 22  BUN 7  CREATININE 0.39*  GLUCOSE 87  CALCIUM 8.7*   Results for Gina, Gomez (MRN [id number]) as of 07/14/2015 08:17  Ref. Range 07/12/2015 21:28 07/12/2015 23:58 07/13/2015 02:22 07/13/2015 05:26 07/14/2015 06:01  Glucose-Capillary Latest Ref Range: 65-99 mg/dL 77 87 92  83   Had one 170 at 2hrs PP yesterday  Radiology: none  Assessment & Plan:  Pt doing well *Postpartum/postop: routine care -MMR ordered -f/u rpr *GDM: will do checks while in house, am fasthing and 2hr PP *gHTN: normal BPs yesterday; will continue to follow *Dispo: likely  monday  O POS / Rubella Not immune / RPR not seen so ordered / HIV negative /  HepBsAg negative / Tdap UTD: Yes/pap needed PP / Breast  / BC: undecided / Follow up: Birder Robson. MD [place] Pager [phone]

## 2015-07-15 LAB — GLUCOSE, CAPILLARY
GLUCOSE-CAPILLARY: 93 mg/dL (ref 65–99)
GLUCOSE-CAPILLARY: 123 mg/dL — AB (ref 65–99)

## 2015-07-15 MED ORDER — IBUPROFEN 600 MG PO TABS: 600.0000 mg | ORAL_TABLET | Freq: Four times a day (QID) | ORAL | Status: DC

## 2015-07-15 MED ORDER — VARICELLA VIRUS VACCINE LIVE 1350 PFU/0.5ML IJ SUSR
0.5000 mL | Freq: Once | INTRAMUSCULAR | Status: AC
  Administered 2015-07-15: 0.5 mL via SUBCUTANEOUS
  Filled 2015-07-15: qty 0.5

## 2015-07-15 MED ORDER — NORETHINDRONE 0.35 MG PO TABS: 1.0000 | ORAL_TABLET | Freq: Every day | ORAL | Status: DC

## 2015-07-15 NOTE — Progress Notes (Signed)
Patient understands all discharge instructions and the need to make follow up appointments. Patient discharge via wheelchair with auxillary. 

## 2015-07-15 NOTE — Discharge Instructions (Signed)

## 2015-07-22 LAB — GLUCOSE, CAPILLARY
GLUCOSE-CAPILLARY: 170 mg/dL — AB (ref 65–99)
GLUCOSE-CAPILLARY: 106 mg/dL — AB (ref 65–99)
GLUCOSE-CAPILLARY: 90 mg/dL (ref 65–99)

## 2015-10-03 ENCOUNTER — Emergency Department (HOSPITAL_COMMUNITY)
Admission: EM | Admit: 2015-10-03 | Discharge: 2015-10-03 | Disposition: A | Discharge status: Home / Self Care | Payer: Medicaid Other | Attending: Emergency Medicine | Admitting: Emergency Medicine

## 2015-10-03 ENCOUNTER — Encounter (HOSPITAL_COMMUNITY): Payer: Self-pay | Admitting: Emergency Medicine

## 2015-10-03 DIAGNOSIS — E669 Obesity, unspecified: Secondary | ICD-10-CM | POA: Insufficient documentation

## 2015-10-03 DIAGNOSIS — N72 Inflammatory disease of cervix uteri: Secondary | ICD-10-CM

## 2015-10-03 DIAGNOSIS — Z3202 Encounter for pregnancy test, result negative: Secondary | ICD-10-CM | POA: Insufficient documentation

## 2015-10-03 DIAGNOSIS — R102 Pelvic and perineal pain: Secondary | ICD-10-CM

## 2015-10-03 LAB — URINALYSIS, ROUTINE W REFLEX MICROSCOPIC
Bilirubin Urine: NEGATIVE
Glucose, UA: NEGATIVE mg/dL
Ketones, ur: NEGATIVE mg/dL
Nitrite: NEGATIVE
Protein, ur: NEGATIVE mg/dL
Specific Gravity, Urine: 1.015 (ref 1.005–1.030)
Urobilinogen, UA: 0.2 mg/dL (ref 0.0–1.0)
pH: 6 (ref 5.0–8.0)

## 2015-10-03 LAB — URINE MICROSCOPIC-ADD ON

## 2015-10-03 LAB — BASIC METABOLIC PANEL
Anion gap: 11 (ref 5–15)
BUN: 11 mg/dL (ref 6–20)
CHLORIDE: 104 mmol/L (ref 101–111)
CO2: 23 mmol/L (ref 22–32)
CREATININE: 0.49 mg/dL (ref 0.44–1.00)
Calcium: 9.5 mg/dL (ref 8.9–10.3)
GFR calc Af Amer: >60 mL/min (ref >60)
GFR calc non Af Amer: >60 mL/min (ref >60)
Glucose, Bld: 82 mg/dL (ref 65–99)
Potassium: 4.2 mmol/L (ref 3.5–5.1)
SODIUM: 138 mmol/L (ref 135–145)

## 2015-10-03 LAB — CBC WITH DIFFERENTIAL/PLATELET
Basophils Absolute: 0 10*3/uL (ref 0.0–0.1)
Basophils Relative: 0 %
EOS ABS: 0.1 10*3/uL (ref 0.0–0.7)
EOS PCT: 2 %
HCT: 40.7 % (ref 36.0–46.0)
HEMOGLOBIN: 12.6 g/dL (ref 12.0–15.0)
LYMPHS ABS: 2.4 10*3/uL (ref 0.7–4.0)
Lymphocytes Relative: 26 %
MCH: 24.7 pg — AB (ref 26.0–34.0)
MCHC: 31 g/dL (ref 30.0–36.0)
MCV: 79.8 fL (ref 78.0–100.0)
MONOS PCT: 6 %
Monocytes Absolute: 0.6 10*3/uL (ref 0.1–1.0)
Neutro Abs: 6.2 10*3/uL (ref 1.7–7.7)
Neutrophils Relative %: 66 %
PLATELETS: 299 10*3/uL (ref 150–400)
RBC: 5.1 MIL/uL (ref 3.87–5.11)
RDW: 14 % (ref 11.5–15.5)
WBC: 9.3 10*3/uL (ref 4.0–10.5)

## 2015-10-03 LAB — WET PREP, GENITAL
Clue Cells Wet Prep HPF POC: NONE SEEN
Trich, Wet Prep: NONE SEEN
YEAST WET PREP: NONE SEEN

## 2015-10-03 LAB — POC URINE PREG, ED: Preg Test, Ur: NEGATIVE

## 2015-10-03 LAB — PREGNANCY, URINE: Preg Test, Ur: NEGATIVE

## 2015-10-03 MED ORDER — AZITHROMYCIN 250 MG PO TABS
1000.0000 mg | ORAL_TABLET | Freq: Once | ORAL | Status: AC
  Administered 2015-10-03: 1000 mg via ORAL
  Filled 2015-10-03: qty 4

## 2015-10-03 NOTE — Discharge Instructions (Signed)
Return tomorrow for the ultrasound to look at the placement of the IUD and any other problems that may be causing your pain. Take ibuprofen for your pain.

## 2015-10-03 NOTE — ED Provider Notes (Signed)
CSN: [id number]     Arrival date & time 10/03/15  1331 History   First MD Initiated Contact with Patient 10/03/15 1726     Chief Complaint  Patient presents with  . Pelvic Pain     (Consider location/radiation/quality/duration/timing/severity/associated sxs/prior Treatment) Patient is a 19 y.o. female presenting with pelvic pain. The history is provided by the patient.  Pelvic Pain This is a new problem. The current episode started 1 to 4 weeks ago. The problem occurs constantly. The problem has been gradually worsening. She has tried NSAIDs and acetaminophen for the symptoms. The treatment provided no relief.   Letetia L Jou is a 19 y.o. G1 P1 with IUD for birth control. Patient reports the IUD was placed 2 weeks ago and she has had pain on her left side since then. Patient went to her GYN in [place] yesterday but insurance had expired and they told her they could no longer see her. Patient went to Urgent Care in [place] and they told her they did not have ultrasound machine to look for IUD placement. Patient here today with continued pain on her left side.    Past Medical History  Diagnosis Date  . Medical history non-contributory   . Obesity (BMI 35.0-39.9 without comorbidity) [facility](HCC)    Past Surgical History  Procedure Laterality Date  . No past surgeries     Family History  Problem Relation Age of Onset  . Cancer Maternal Grandmother   . Hypertension Maternal Grandfather   . Diabetes Maternal Grandfather   . Heart disease Maternal Grandfather    Social History  Substance Use Topics  . Smoking status: Never Smoker   . Smokeless tobacco: Never Used  . Alcohol Use: No   OB History    Gravida Para Term Preterm AB TAB SAB Ectopic Multiple Living   1 1 1       0 1     Review of Systems  Genitourinary: Positive for pelvic pain.  all other systems negative    Allergies  Review of patient's allergies indicates no known allergies.  Home Medications   Prior to  Admission medications   Medication Sig Start Date End Date Taking? Authorizing Provider  acetaminophen (TYLENOL) 500 MG tablet Take 1,000 mg by mouth every 6 (six) hours as needed for moderate pain.   Yes Historical Provider, MD  levonorgestrel (MIRENA) 20 MCG/24HR IUD 1 each by Intrauterine route once.   Yes Historical Provider, MD   BP 137/89 mmHg  Pulse 87  Temp(Src) 97.8 F (36.6 C) (Oral)  Resp 18  Ht 5\' 1"  (1.549 m)  Wt 175 lb (79.379 kg)  BMI 33.08 kg/m2  SpO2 100% Physical Exam  Constitutional: She is oriented to person, place, and time. She appears well-developed and well-nourished. No distress.  HENT:  Head: Normocephalic.  Eyes: Conjunctivae and EOM are normal.  Neck: Neck supple.  Cardiovascular: Normal rate.   Pulmonary/Chest: Effort normal.  Abdominal: Soft. There is no tenderness.  Genitourinary:  External genitalia without lesions. Bloody mucous d/c vaginal vault. IUD string visible, cervix inflamed. No CMT, no adnexal tenderness or mass palpated. Uterus no enlarged.   Musculoskeletal: Normal range of motion.  Neurological: She is alert and oriented to person, place, and time. No cranial nerve deficit.  Skin: Skin is warm and dry.  Psychiatric: She has a normal mood and affect. Her behavior is normal.  Nursing note and vitals reviewed.   ED Course  Procedures (including critical care time) Labs Review Results for orders  placed or performed during the hospital encounter of 10/03/15 (from the past 24 hour(s))  Urinalysis, Routine w reflex microscopic (not at [facility])     Status: Abnormal   Collection Time: 10/03/15  5:30 PM  Result Value Ref Range   Color, Urine YELLOW YELLOW   APPearance CLEAR CLEAR   Specific Gravity, Urine 1.015 1.005 - 1.030   pH 6.0 5.0 - 8.0   Glucose, UA NEGATIVE NEGATIVE mg/dL   Hgb urine dipstick MODERATE (A) NEGATIVE   Bilirubin Urine NEGATIVE NEGATIVE   Ketones, ur NEGATIVE NEGATIVE mg/dL   Protein, ur NEGATIVE NEGATIVE mg/dL    Urobilinogen, UA 0.2 0.0 - 1.0 mg/dL   Nitrite NEGATIVE NEGATIVE   Leukocytes, UA TRACE (A) NEGATIVE  Pregnancy, urine     Status: None   Collection Time: 10/03/15  5:30 PM  Result Value Ref Range   Preg Test, Ur NEGATIVE NEGATIVE  Urine microscopic-add on     Status: Abnormal   Collection Time: 10/03/15  5:30 PM  Result Value Ref Range   Squamous Epithelial / LPF FEW (A) RARE   WBC, UA 0-2 <3 WBC/hpf   RBC / HPF 3-6 <3 RBC/hpf   Bacteria, UA FEW (A) RARE  POC urine preg, ED (not at [facility])     Status: None   Collection Time: 10/03/15  6:07 PM  Result Value Ref Range   Preg Test, Ur NEGATIVE NEGATIVE  Wet prep, genital     Status: Abnormal   Collection Time: 10/03/15  6:10 PM  Result Value Ref Range   Yeast Wet Prep HPF POC NONE SEEN NONE SEEN   Trich, Wet Prep NONE SEEN NONE SEEN   Clue Cells Wet Prep HPF POC NONE SEEN NONE SEEN   WBC, Wet Prep HPF POC MANY (A) NONE SEEN     Imaging Review No results found. I have personally reviewed and evaluated the lab results as part of my medical decision-making. Zithromax 1 gram PO prior to d/c Follow up tomorrow for ultrasound at 2pm.   MDM  19 y.o. female with pelvic pain that started a few days after having her IUD placed. Stable for d/c without fever or acute abdomen. She will return tomorrow for ultrasound. She will take ibuprofen for discomfort. Treated for cervicitis. No pain on exam today, patient reports that it comes and goes.   GC, Chlamydia cultures pending.   Final diagnoses:  Pelvic pain in female  Cervicitis       Gina Napoleon, NP 10/03/15 Serena Croissant  Raeford Razor, MD 10/04/15 [phone]

## 2015-10-03 NOTE — ED Notes (Signed)
PT stated IUD placed about 2 weeks ago with left sided pelvic pain and spotting starting 3 days after placement. PT states urgent care referred her to ED d/t ultrasound unavailable.

## 2015-10-04 ENCOUNTER — Other Ambulatory Visit (HOSPITAL_COMMUNITY): Payer: Self-pay | Admitting: Nurse Practitioner

## 2015-10-04 ENCOUNTER — Ambulatory Visit (HOSPITAL_COMMUNITY)
Admission: RE | Admit: 2015-10-04 | Discharge: 2015-10-04 | Disposition: A | Discharge status: Home / Self Care | Payer: Medicaid Other | Source: Ambulatory Visit | Attending: Emergency Medicine | Admitting: Emergency Medicine

## 2015-10-04 DIAGNOSIS — Z975 Presence of (intrauterine) contraceptive device: Secondary | ICD-10-CM | POA: Diagnosis not present

## 2015-10-04 DIAGNOSIS — R102 Pelvic and perineal pain: Secondary | ICD-10-CM | POA: Insufficient documentation

## 2015-10-04 DIAGNOSIS — N8300 Follicular cyst of ovary, unspecified side: Secondary | ICD-10-CM | POA: Diagnosis not present

## 2015-10-04 LAB — GC/CHLAMYDIA PROBE AMP (~~LOC~~) NOT AT ARMC
Chlamydia: NEGATIVE
NEISSERIA GONORRHEA: NEGATIVE

## 2015-10-04 NOTE — ED Provider Notes (Signed)
Patient return for pelvic ultrasound. Results show IUD in appropriate position. Small left ovarian follicular cyst. Results discussed with patient.    Glynn OctaveStephen Osie Merkin, MD 10/04/15 [phone]

## 2015-12-20 DIAGNOSIS — Z8632 Personal history of gestational diabetes: Secondary | ICD-10-CM | POA: Insufficient documentation

## 2016-08-11 ENCOUNTER — Encounter (HOSPITAL_COMMUNITY): Payer: Self-pay | Admitting: Emergency Medicine

## 2016-08-11 ENCOUNTER — Emergency Department (HOSPITAL_COMMUNITY)
Admission: EM | Admit: 2016-08-11 | Discharge: 2016-08-11 | Disposition: A | Discharge status: Home / Self Care | Payer: Medicaid Other | Attending: Emergency Medicine | Admitting: Emergency Medicine

## 2016-08-11 DIAGNOSIS — Z79899 Other long term (current) drug therapy: Secondary | ICD-10-CM | POA: Insufficient documentation

## 2016-08-11 DIAGNOSIS — N760 Acute vaginitis: Secondary | ICD-10-CM | POA: Insufficient documentation

## 2016-08-11 DIAGNOSIS — B9689 Other specified bacterial agents as the cause of diseases classified elsewhere: Secondary | ICD-10-CM

## 2016-08-11 DIAGNOSIS — R1030 Lower abdominal pain, unspecified: Secondary | ICD-10-CM | POA: Diagnosis present

## 2016-08-11 LAB — URINALYSIS, ROUTINE W REFLEX MICROSCOPIC
BILIRUBIN URINE: NEGATIVE
GLUCOSE, UA: NEGATIVE mg/dL
HGB URINE DIPSTICK: NEGATIVE
KETONES UR: NEGATIVE mg/dL
Leukocytes, UA: NEGATIVE
Nitrite: NEGATIVE
PROTEIN: NEGATIVE mg/dL
Specific Gravity, Urine: 1.02 (ref 1.005–1.030)
pH: 6 (ref 5.0–8.0)

## 2016-08-11 LAB — WET PREP, GENITAL
Sperm: NONE SEEN
Trich, Wet Prep: NONE SEEN
Yeast Wet Prep HPF POC: NONE SEEN

## 2016-08-11 LAB — PREGNANCY, URINE: PREG TEST UR: NEGATIVE

## 2016-08-11 MED ORDER — METRONIDAZOLE 500 MG PO TABS
500.0000 mg | ORAL_TABLET | Freq: Once | ORAL | Status: AC
  Administered 2016-08-11: 500 mg via ORAL
  Filled 2016-08-11: qty 1

## 2016-08-11 MED ORDER — METRONIDAZOLE 500 MG PO TABS: 500.0000 mg | ORAL_TABLET | Freq: Two times a day (BID) | ORAL | 0 refills | Status: DC

## 2016-08-11 NOTE — ED Triage Notes (Signed)
Pt c/o lower back/abdominal pain x 3 weeks with some nausea. Denies v/d. Denies urinary sx. Denies vaginal bleeding/discharge.

## 2016-08-11 NOTE — ED Provider Notes (Signed)
[facility] DEPT Provider Note   CSN: [id number] Arrival date & time: 08/11/16  1444     History   Chief Complaint Chief Complaint  Patient presents with  . Abdominal Pain    HPI Gina Gomez is a 20 y.o. female.  20 yo F w/ a few days of lower abdominal pain associated with lower back pain. Has had some hematuria during that time and nausea but no other associated symptoms. No vaginal discharge. No vomiting. No fever. Had an episode of chest pain today but thinks it was from stretching too much yesterday.  No history of similar pain. No dysuria, cloudy or malodorous urine. No rashes. Has depo-provera shot, no recent mentstrual cycles.  No modifying factors.      Past Medical History:  Diagnosis Date  . Medical history non-contributory   . Obesity (BMI 35.0-39.9 without comorbidity) [facility])     Patient Active Problem List   Diagnosis Date Noted  . Pregnancy 07/12/2015  . Non-reactive NST (non-stress test) 06/05/2015    Past Surgical History:  Procedure Laterality Date  . NO PAST SURGERIES      OB History    Gravida Para Term Preterm AB Living   1 1 1     1    SAB TAB Ectopic Multiple Live Births         0         Home Medications    Prior to Admission medications   Medication Sig Start Date End Date Taking? Authorizing Provider  acetaminophen (TYLENOL) 500 MG tablet Take 1,000 mg by mouth every 6 (six) hours as needed for moderate pain.   Yes Historical Provider, MD  medroxyPROGESTERone (DEPO-PROVERA) 150 MG/ML injection Inject 150 mg into the muscle every 3 (three) months.   Yes Historical Provider, MD  metroNIDAZOLE (FLAGYL) 500 MG tablet Take 1 tablet (500 mg total) by mouth 2 (two) times daily. One po bid x 7 days 08/11/16   Marily Memos, MD    Family History Family History  Problem Relation Age of Onset  . Hypertension Maternal Grandfather   . Diabetes Maternal Grandfather   . Heart disease Maternal Grandfather   . Cancer Maternal Grandmother       Social History Social History  Substance Use Topics  . Smoking status: Never Smoker  . Smokeless tobacco: Never Used  . Alcohol use No     Allergies   Review of patient's allergies indicates no known allergies.   Review of Systems Review of Systems  All other systems reviewed and are negative.    Physical Exam Updated Vital Signs BP 131/74 (BP Location: Left Arm)   Pulse 87   Temp 98.9 F (37.2 C)   Resp 17   Ht 5\' 2"  (1.575 m)   Wt 180 lb (81.6 kg)   LMP 12/01/2015   SpO2 100%   BMI 32.92 kg/m   Physical Exam  Constitutional: She appears well-developed and well-nourished. No distress.  HENT:  Head: Normocephalic and atraumatic.  Eyes: Conjunctivae are normal.  Neck: Neck supple.  Cardiovascular: Normal rate and regular rhythm.   No murmur heard. Pulmonary/Chest: Effort normal and breath sounds normal. No respiratory distress.  Abdominal: Soft. She exhibits no distension. There is no tenderness.  Genitourinary: Uterus normal. Cervix exhibits discharge and friability. Cervix exhibits no motion tenderness. Right adnexum displays no tenderness and no fullness. Left adnexum displays no tenderness and no fullness. There is bleeding (friable cervix) in the vagina. No erythema or tenderness in the vagina.  Vaginal discharge found.  Musculoskeletal: She exhibits no edema.  Neurological: She is alert.  Skin: Skin is warm and dry.  Psychiatric: She has a normal mood and affect.  Nursing note and vitals reviewed.    ED Treatments / Results  Labs (all labs ordered are listed, but only abnormal results are displayed) Labs Reviewed  WET PREP, GENITAL - Abnormal; Notable for the following:       Result Value   Clue Cells Wet Prep HPF POC PRESENT (*)    WBC, Wet Prep HPF POC MANY (*)    All other components within normal limits  URINALYSIS, ROUTINE W REFLEX MICROSCOPIC (NOT AT [facility])  PREGNANCY, URINE  GC/CHLAMYDIA PROBE AMP ([place]) NOT AT [facility]    EKG  EKG  Interpretation None       Radiology No results found.  Procedures Procedures (including critical care time)  Medications Ordered in ED Medications  metroNIDAZOLE (FLAGYL) tablet 500 mg (500 mg Oral Given 08/11/16 1730)     Initial Impression / Assessment and Plan / ED Course  I have reviewed the triage vital signs and the nursing notes.  Pertinent labs & imaging results that were available during my care of the patient were reviewed by me and considered in my medical decision making (see chart for details).  Clinical Course   UTI v kidney stone v STD/PID  Urine clean, STD samples sent.   BV present. Flagyl given and Rx for same. Will fu w/ pcp.   Final Clinical Impressions(s) / ED Diagnoses   Final diagnoses:  Bacterial vaginosis    New Prescriptions Discharge Medication List as of 08/11/2016  5:21 PM    START taking these medications   Details  metroNIDAZOLE (FLAGYL) 500 MG tablet Take 1 tablet (500 mg total) by mouth 2 (two) times daily. One po bid x 7 days, Starting Tue 08/11/2016, Print         Marily MemosJason Holly Pring, MD 08/11/16 [phone]

## 2016-08-12 LAB — GC/CHLAMYDIA PROBE AMP (~~LOC~~) NOT AT ARMC
CHLAMYDIA, DNA PROBE: NEGATIVE
NEISSERIA GONORRHEA: NEGATIVE

## 2016-10-21 ENCOUNTER — Emergency Department
Admission: EM | Admit: 2016-10-21 | Discharge: 2016-10-21 | Disposition: A | Discharge status: Home / Self Care | Payer: Medicaid Other | Attending: Emergency Medicine | Admitting: Emergency Medicine

## 2016-10-21 ENCOUNTER — Emergency Department: Payer: Medicaid Other

## 2016-10-21 DIAGNOSIS — Z791 Long term (current) use of non-steroidal anti-inflammatories (NSAID): Secondary | ICD-10-CM | POA: Insufficient documentation

## 2016-10-21 DIAGNOSIS — R0789 Other chest pain: Secondary | ICD-10-CM | POA: Insufficient documentation

## 2016-10-21 DIAGNOSIS — R072 Precordial pain: Secondary | ICD-10-CM | POA: Diagnosis present

## 2016-10-21 DIAGNOSIS — R079 Chest pain, unspecified: Secondary | ICD-10-CM

## 2016-10-21 LAB — BASIC METABOLIC PANEL
Anion gap: 7 (ref 5–15)
BUN: 12 mg/dL (ref 6–20)
CHLORIDE: 102 mmol/L (ref 101–111)
CO2: 25 mmol/L (ref 22–32)
CREATININE: 0.56 mg/dL (ref 0.44–1.00)
Calcium: 8.7 mg/dL — ABNORMAL LOW (ref 8.9–10.3)
GFR calc non Af Amer: >60 mL/min (ref >60)
Glucose, Bld: 112 mg/dL — ABNORMAL HIGH (ref 65–99)
POTASSIUM: 3.8 mmol/L (ref 3.5–5.1)
Sodium: 134 mmol/L — ABNORMAL LOW (ref 135–145)

## 2016-10-21 LAB — CBC
HCT: 38.3 % (ref 35.0–47.0)
Hemoglobin: 12.6 g/dL (ref 12.0–16.0)
MCH: 25.6 pg — ABNORMAL LOW (ref 26.0–34.0)
MCHC: 32.8 g/dL (ref 32.0–36.0)
MCV: 78 fL — ABNORMAL LOW (ref 80.0–100.0)
Platelets: 258 K/uL (ref 150–440)
RBC: 4.91 MIL/uL (ref 3.80–5.20)
RDW: 14.9 % — ABNORMAL HIGH (ref 11.5–14.5)
WBC: 9.7 K/uL (ref 3.6–11.0)

## 2016-10-21 LAB — TROPONIN I: Troponin I: <0.03 ng/mL

## 2016-10-21 LAB — POCT PREGNANCY, URINE: Preg Test, Ur: NEGATIVE

## 2016-10-21 LAB — FIBRIN DERIVATIVES D-DIMER (ARMC ONLY): Fibrin derivatives D-dimer (ARMC): 471 (ref 0–499)

## 2016-10-21 NOTE — ED Triage Notes (Signed)
Patient to ER for c/o chest pain to mid chest. States issue has been going on for 3 months intermittently, that mother made her come today. Patient reports pain radiating down right arm. +Cough. Patient in no acute distress. Refused wheelchair, ambulatory to stat desk without difficulty. Denies shortness of breath.

## 2016-10-21 NOTE — ED Notes (Signed)
Pt denies pain at this time. Reports pain during wait time. Pt alert and oriented X4, active, cooperative, pt in NAD. RR even and unlabored, color WNL.  Pt ambulated back to room. Pt in gown and awaiting MD.

## 2016-10-21 NOTE — ED Provider Notes (Addendum)
[facility] Emergency Department Provider Note  ____________________________________________   I have reviewed the triage vital signs and the nursing notes.   HISTORY  Chief Complaint Chest Pain    HPI Gina SprinklesShawna L Knutzen is a 20 y.o. female who presents today complaining of a very pleasant, right-sided chest pain. It is just to the right of her sternum. His been present off and on for several months. She does feel that also sometimes hurts in her right arm. She denies any fever or chills or shortness of breath. It is not pleuritic pain. He has no shortness of breath no leg swelling no recent travel no personal or family history of PE or DVT. Pain is worse when she changes position or touches it. 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 She is post to have her shot reviewed in September but did not. She does not believe she is pregnant. Pain is sharp, lasts for a few seconds, nothing makes it better besides resting and nothing makes it worse besides changing position or moving. It is not pleuritic. It does not radiate although sometimes she has an associated arm pain as well.    Past Medical History:  Diagnosis Date  . Medical history non-contributory   . Obesity (BMI 35.0-39.9 without comorbidity)     Patient Active Problem List   Diagnosis Date Noted  . Pregnancy 07/12/2015  . Non-reactive NST (non-stress test) 06/05/2015    Past Surgical History:  Procedure Laterality Date  . NO PAST SURGERIES      Prior to Admission medications   Medication Sig Start Date End Date Taking? Authorizing Provider  acetaminophen (TYLENOL) 500 MG tablet Take 1,000 mg by mouth every 6 (six) hours as needed for moderate pain.    Historical Provider, MD  medroxyPROGESTERone (DEPO-PROVERA) 150 MG/ML injection Inject 150 mg into the  muscle every 3 (three) months.    Historical Provider, MD  metroNIDAZOLE (FLAGYL) 500 MG tablet Take 1 tablet (500 mg total) by mouth 2 (two) times daily. One po bid x 7 days 08/11/16   Marily MemosJason Mesner, MD    Allergies Patient has no known allergies.  Family History  Problem Relation Age of Onset  . Hypertension Maternal Grandfather   . Diabetes Maternal Grandfather   . Heart disease Maternal Grandfather   . Cancer Maternal Grandmother     Social History Social History  Substance Use Topics  . Smoking status: Never Smoker  . Smokeless tobacco: Never Used  . Alcohol use No    Review of Systems Constitutional: No fever/chills Eyes: No visual changes. ENT: No sore throat. No stiff neck no neck pain Cardiovascular: See history of present illness regarding Respiratory: Denies shortness of breath. Gastrointestinal:   no vomiting.  No diarrhea.  No constipation. Genitourinary: Negative for dysuria. Musculoskeletal: Negative lower extremity swelling Skin: Negative for rash. Neurological: Negative for severe headaches, focal weakness or numbness. 10-point ROS otherwise negative.  ____________________________________________   PHYSICAL EXAM:  VITAL SIGNS: ED Triage Vitals  Enc Vitals Group     BP 10/21/16 1220 (!) 145/73     Pulse Rate 10/21/16 1220 (!) 105     Resp 10/21/16 1220 16     Temp 10/21/16 1220 98 F (36.7 C)     Temp Source 10/21/16 1220 Oral     SpO2 10/21/16 1220 100 %  Weight 10/21/16 1220 180 lb (81.6 kg)     Height 10/21/16 1220 5\' 2"  (1.575 m)     Head Circumference --      Peak Flow --      Pain Score 10/21/16 1229 0     Pain Loc --      Pain Edu? --      Excl. in GC? --     Constitutional: Alert and oriented. Well appearing and in no acute distress. Eyes: Conjunctivae are normal. PERRL. EOMI. Head: Atraumatic. Nose: No congestion/rhinnorhea. Mouth/Throat: Mucous membranes are moist.  Oropharynx non-erythematous. Neck: No stridor.   Nontender  with no meningismus Cardiovascular: Normal rate, regular rhythm. Grossly normal heart sounds.  Good peripheral circulation. Chest: Tender to palpation in the right chest wall and a social services was "ouch that's the pain right there" and pulls back, no chest no crepitus, female nurse present with me during this exam. No shingles lesions no cellulitic changes no palpated rib fracture Respiratory: Normal respiratory effort.  No retractions. Lungs CTAB. Abdominal: Soft and nontender. No distention. No guarding no rebound Back:  There is no focal tenderness or step off.  there is no midline tenderness there are no lesions noted. there is no CVA tenderness Musculoskeletal: No lower extremity tenderness, no upper extremity tenderness. No joint effusions, no DVT signs strong distal pulses no edema Neurologic:  Normal speech and language. No gross focal neurologic deficits are appreciated.  Skin:  Skin is warm, dry and intact. No rash noted. Psychiatric: Mood and affect are normal. Speech and behavior are normal.  ____________________________________________   LABS (all labs ordered are listed, but only abnormal results are displayed)  Labs Reviewed  BASIC METABOLIC PANEL - Abnormal; Notable for the following:       Result Value   Sodium 134 (*)    Glucose, Bld 112 (*)    Calcium 8.7 (*)    All other components within normal limits  CBC - Abnormal; Notable for the following:    MCV 78.0 (*)    MCH 25.6 (*)    RDW 14.9 (*)    All other components within normal limits  TROPONIN I  FIBRIN DERIVATIVES D-DIMER ([facility] ONLY)   ____________________________________________  EKG  I personally interpreted any EKGs ordered by me or triage Patient in normal sinus rhythm rate 94 bpm no acute ST elevation or depression normal axis ____________________________________________  RADIOLOGY  I reviewed any imaging ordered by me or triage that were performed during my shift and, if possible, patient  and/or family made aware of any abnormal findings. ____________________________________________   PROCEDURES  Procedure(s) performed: None  Procedures  Critical Care performed: None  ____________________________________________   INITIAL IMPRESSION / ASSESSMENT AND PLAN / ED COURSE  Pertinent labs & imaging results that were available during my care of the patient were reviewed by me and considered in my medical decision making (see chart for details).  Patient with very reproducible chest wall pain for 3 months off-and-on. Has had it for the last 2 days this time. Very reassuring exam. Very low suspicion for PE. This is not essentially a pleuritic pathology as the patient describes it. Nonetheless, she is not perked negative as her initial heart rate, which now in the 80s, was 104, and she has had Depo-Provera although she missed her last dose 3 months ago and likely is not technically therefore on exogenous estrogens. Had an abundance of caution therefore we did check a d-dimer which is reassuringly negative. I do  not think at this time a CT scan is warranted. The radiation burn to the patient likely outweighs any possible benefit. No evidence of ACS .At this time, there does not appear to be clinical evidence to support the diagnosis of pulmonary embolus, dissection, myocarditis, endocarditis, pericarditis, pericardial tamponade, acute coronary syndrome, pneumothorax, pneumonia, or any other acute intrathoracic pathology that will require admission or acute intervention. Nor is there evidence of any significant intra-abdominal pathology causing this discomfort. We will recommend nonsteroidal pain medication and close outpatient follow-up.  ----------------------------------------- 4:04 PM on 10/21/2016 -----------------------------------------  Excessively discussed risks benefits and alternatives of CT with the patient. Should prefer not to have it. I think this is a reasonable choice.  I did however want to involve her the decision-making process. I think a negative d-dimer with this exam and history is a sufficient reason not to CT. However, there are limitations a d-dimer and the patient at this time is comfortable with discharge. However, she does understand that she must come back if she feels worse in any way including shortness of breath etc. Close outpatient follow-up.  Clinical Course    ____________________________________________   FINAL CLINICAL IMPRESSION(S) / ED DIAGNOSES  Final diagnoses:  None      This chart was dictated using voice recognition software.  Despite best efforts to proofread,  errors can occur which can change meaning.      Jeanmarie PlantJames A Desirre Eickhoff, MD 10/21/16 1528    Jeanmarie PlantJames A Calla Wedekind, MD 10/21/16 [phone]

## 2017-04-30 ENCOUNTER — Encounter: Payer: Self-pay | Admitting: Emergency Medicine

## 2017-04-30 ENCOUNTER — Emergency Department
Admission: EM | Admit: 2017-04-30 | Discharge: 2017-04-30 | Disposition: A | Discharge status: Home / Self Care | Payer: Medicaid Other | Attending: Emergency Medicine | Admitting: Emergency Medicine

## 2017-04-30 ENCOUNTER — Ambulatory Visit
Admission: EM | Admit: 2017-04-30 | Discharge: 2017-04-30 | Discharge status: Absent without leave | Payer: Medicaid Other

## 2017-04-30 DIAGNOSIS — Y939 Activity, unspecified: Secondary | ICD-10-CM | POA: Insufficient documentation

## 2017-04-30 DIAGNOSIS — W57XXXA Bitten or stung by nonvenomous insect and other nonvenomous arthropods, initial encounter: Secondary | ICD-10-CM | POA: Insufficient documentation

## 2017-04-30 DIAGNOSIS — Y929 Unspecified place or not applicable: Secondary | ICD-10-CM | POA: Insufficient documentation

## 2017-04-30 DIAGNOSIS — Y999 Unspecified external cause status: Secondary | ICD-10-CM | POA: Insufficient documentation

## 2017-04-30 DIAGNOSIS — S50861A Insect bite (nonvenomous) of right forearm, initial encounter: Secondary | ICD-10-CM | POA: Diagnosis present

## 2017-04-30 DIAGNOSIS — L03113 Cellulitis of right upper limb: Secondary | ICD-10-CM | POA: Diagnosis not present

## 2017-04-30 MED ORDER — DOXYCYCLINE HYCLATE 50 MG PO CAPS: 100.0000 mg | ORAL_CAPSULE | Freq: Two times a day (BID) | ORAL | 0 refills | Status: AC

## 2017-04-30 NOTE — ED Triage Notes (Signed)
Pt reports removed tick yesterday.  Drew ring around redness but has gotten worse so came in today. No pain or fevers. Ambulatory without difficulty. NAD

## 2017-04-30 NOTE — ED Provider Notes (Signed)
[facility] Emergency Department Provider Note  ____________________________________________  Time seen: Approximately 6:02 PM  I have reviewed the triage vital signs and the nursing notes.   HISTORY  Chief Complaint tick bite    HPI Gina Gomez is a 21 y.o. female that presents to the emergency department with rash after tick bite. Patient states that she pulled a tick off of her arm on Wednesday night. Tick was attached for less than 8 hours. Rash appeared yesterday and worsened today. She denies fever, headache, SOB, CP, nausea, vomiting, abdominal pain.    Past Medical History:  Diagnosis Date  . Medical history non-contributory   . Obesity (BMI 35.0-39.9 without comorbidity)     Patient Active Problem List   Diagnosis Date Noted  . Pregnancy 07/12/2015  . Non-reactive NST (non-stress test) 06/05/2015    Past Surgical History:  Procedure Laterality Date  . NO PAST SURGERIES      Prior to Admission medications   Medication Sig Start Date End Date Taking? Authorizing Provider  acetaminophen (TYLENOL) 500 MG tablet Take 1,000 mg by mouth every 6 (six) hours as needed for moderate pain.    [provider]  doxycycline (VIBRAMYCIN) 50 MG capsule Take 2 capsules (100 mg total) by mouth 2 (two) times daily. 04/30/17 05/10/17  Enid Derry, PA-C  medroxyPROGESTERone (DEPO-PROVERA) 150 MG/ML injection Inject 150 mg into the muscle every 3 (three) months.    [provider]  metroNIDAZOLE (FLAGYL) 500 MG tablet Take 1 tablet (500 mg total) by mouth 2 (two) times daily. One po bid x 7 days 08/11/16   Mesner, Barbara Cower, MD    Allergies Patient has no known allergies.  Family History  Problem Relation Age of Onset  . Hypertension Maternal Grandfather   . Diabetes Maternal Grandfather   . Heart disease Maternal Grandfather   . Cancer Maternal Grandmother     Social History Social History  Substance Use Topics  . Smoking status:  Never Smoker  . Smokeless tobacco: Never Used  . Alcohol use No     Review of Systems  Constitutional: No fever/chills Cardiovascular: No chest pain. Respiratory: No SOB. Gastrointestinal: No abdominal pain.  No nausea, no vomiting.  Musculoskeletal: Negative for musculoskeletal pain. Skin: Negative for abrasions, lacerations, ecchymosis. Positive for rash. Neurological: Negative for headaches, numbness or tingling   ____________________________________________   PHYSICAL EXAM:  VITAL SIGNS: ED Triage Vitals [04/30/17 1632]  Enc Vitals Group     BP 134/81     Pulse Rate (!) 104     Resp 18     Temp 98.4 F (36.9 C)     Temp Source Oral     SpO2 97 %     Weight 190 lb (86.2 kg)     Height 5\' 2"  (1.575 m)     Head Circumference      Peak Flow      Pain Score 0     Pain Loc      Pain Edu?      Excl. in GC?      Constitutional: Alert and oriented. Well appearing and in no acute distress. Eyes: Conjunctivae are normal. PERRL. EOMI. Head: Atraumatic. ENT:      Ears:      Nose: No congestion/rhinnorhea.      Mouth/Throat: Mucous membranes are moist.  Neck: No stridor.   Cardiovascular: Normal rate, regular rhythm.  Good peripheral circulation. Respiratory: Normal respiratory effort without tachypnea or retractions. Lungs CTAB. Good air entry  to the bases with no decreased or absent breath sounds. Musculoskeletal: Full range of motion to all extremities. No gross deformities appreciated. Neurologic:  Normal speech and language. No gross focal neurologic deficits are appreciated.  Skin:  Skin is warm, dry and intact. 1 mm scab to the right forearm with 2 inches of surrounding erythema.   ____________________________________________   LABS (all labs ordered are listed, but only abnormal results are displayed)  Labs Reviewed - No data to display ____________________________________________  EKG   ____________________________________________  RADIOLOGY  No  results found.  ____________________________________________    PROCEDURES  Procedure(s) performed:    Procedures    Medications - No data to display   ____________________________________________   INITIAL IMPRESSION / ASSESSMENT AND PLAN / ED COURSE  Pertinent labs & imaging results that were available during my care of the patient were reviewed by me and considered in my medical decision making (see chart for details).  Review of the [place] CSRS was performed in accordance of the [facility] prior to dispensing any controlled drugs.   Patient's diagnosis is consistent with tick bite and cellulitis. Vital signs and exam are reassuring. Tick was attached for less than 8 hours. Patient will be discharged home with prescriptions for Doxycycline. She is not breast feeding. Patient is to follow up with PCP as directed. Patient is given ED precautions to return to the ED for any worsening or new symptoms.     ____________________________________________  FINAL CLINICAL IMPRESSION(S) / ED DIAGNOSES  Final diagnoses:  Cellulitis of right upper extremity  Tick bite, initial encounter      NEW MEDICATIONS STARTED DURING THIS VISIT:  Discharge Medication List as of 04/30/2017  5:56 PM    START taking these medications   Details  doxycycline (VIBRAMYCIN) 50 MG capsule Take 2 capsules (100 mg total) by mouth 2 (two) times daily., Starting Fri 04/30/2017, Until Mon 05/10/2017, Print            This chart was dictated using voice recognition software/Dragon. Despite best efforts to proofread, errors can occur which can change the meaning. Any change was purely unintentional.    Enid DerryWagner, Toben Acuna, PA-C 04/30/17 2147    Emily FilbertWilliams, Jonathan E, MD 04/30/17 2253

## 2017-11-08 ENCOUNTER — Ambulatory Visit: Payer: Self-pay | Attending: Oncology

## 2017-11-11 ENCOUNTER — Encounter: Payer: Self-pay | Admitting: *Deleted

## 2017-11-11 ENCOUNTER — Ambulatory Visit: Payer: Self-pay

## 2017-11-11 ENCOUNTER — Ambulatory Visit: Payer: Self-pay | Attending: Oncology | Admitting: *Deleted

## 2017-11-11 VITALS — BP 136/84 | HR 90 | Temp 98.6°F | Ht 63.0 in | Wt 206.0 lb

## 2017-11-11 DIAGNOSIS — N644 Mastodynia: Secondary | ICD-10-CM

## 2017-11-11 NOTE — Progress Notes (Signed)
Subjective:     Patient ID: Gina Gomez, female   DOB: 1996/09/05, 21 y.o.   MRN: [id number]  HPI   Review of Systems     Objective:   Physical Exam  Pulmonary/Chest: Right breast exhibits no inverted nipple, no mass, no nipple discharge, no skin change and no tenderness. Left breast exhibits tenderness. Left breast exhibits no inverted nipple, no mass, no nipple discharge and no skin change. Breasts are symmetrical.         Assessment:     21 year old White [facility] with complaints of finding a tender lump about 3 weeks ago.  States "I'm not sure if this is chest pain or breast pain".  Describes as intermittent "discomfort" when raising her left arm and leaning forward.  No alleviating factors.  On clinical breast exam I can palpate an asymmetrical tender thickening from 10 to 1:00 left breast.  Asked the patient to point to the palpable lump.  I can palpate fibroglandular like tissue at the 12-1:00 area.  Taught self breast awareness.  Patient has been screened for eligibility.  She does not have any insurance, Medicare or Medicaid.  She also meets financial eligibility.  Hand-out given on the Affordable Care Act.    Plan:     Will get an ultrasound of the left breast.  If no findings on imaging I will have her return for repeat breast exam in 1-2 months.  She is agreeable to the plan.  Will follow-up per [facility] protocol.

## 2017-11-11 NOTE — Patient Instructions (Signed)
Gave patient hand-out, Women Staying Healthy, Active and Well from [facility], with education on breast health, pap smears, heart and colon health. 

## 2017-11-16 ENCOUNTER — Ambulatory Visit
Admission: RE | Admit: 2017-11-16 | Discharge: 2017-11-16 | Disposition: A | Discharge status: Home / Self Care | Payer: Self-pay | Source: Ambulatory Visit | Attending: Oncology | Admitting: Oncology

## 2017-11-16 DIAGNOSIS — N644 Mastodynia: Secondary | ICD-10-CM | POA: Insufficient documentation

## 2017-12-10 ENCOUNTER — Encounter: Payer: Self-pay | Admitting: *Deleted

## 2017-12-10 NOTE — Progress Notes (Signed)
Letter mailed to inform patient to return on 01/17/18 @ 11:30 for repeat clinical breast exam.  HSIS to [employer].

## 2018-01-17 ENCOUNTER — Ambulatory Visit: Payer: Self-pay | Attending: Oncology

## 2018-01-25 ENCOUNTER — Ambulatory Visit
Admission: EM | Admit: 2018-01-25 | Discharge: 2018-01-25 | Disposition: A | Discharge status: Home / Self Care | Payer: Self-pay | Attending: Family Medicine | Admitting: Family Medicine

## 2018-01-25 ENCOUNTER — Other Ambulatory Visit: Payer: Self-pay

## 2018-01-25 ENCOUNTER — Encounter: Payer: Self-pay | Admitting: Emergency Medicine

## 2018-01-25 DIAGNOSIS — R110 Nausea: Secondary | ICD-10-CM

## 2018-01-25 DIAGNOSIS — R1032 Left lower quadrant pain: Secondary | ICD-10-CM

## 2018-01-25 LAB — URINALYSIS, COMPLETE (UACMP) WITH MICROSCOPIC
BILIRUBIN URINE: NEGATIVE
Glucose, UA: NEGATIVE mg/dL
HGB URINE DIPSTICK: NEGATIVE
Ketones, ur: NEGATIVE mg/dL
LEUKOCYTES UA: NEGATIVE
NITRITE: NEGATIVE
PROTEIN: NEGATIVE mg/dL
Specific Gravity, Urine: >1.03 — ABNORMAL HIGH (ref 1.005–1.030)
pH: 5.5 (ref 5.0–8.0)

## 2018-01-25 LAB — PREGNANCY, URINE: PREG TEST UR: NEGATIVE

## 2018-01-25 LAB — HCG, QUANTITATIVE, PREGNANCY: hCG, Beta Chain, Quant, S: <1 m[IU]/mL (ref <5)

## 2018-01-25 MED ORDER — TRAMADOL HCL 50 MG PO TABS: 50.0000 mg | ORAL_TABLET | Freq: Three times a day (TID) | ORAL | 0 refills | Status: DC | PRN

## 2018-01-25 NOTE — ED Triage Notes (Signed)
Patient c/o left sided lower abdominal pain that started a week ago.  Patient states that she is 2 weeks late on her menstrual cycle. Patient reports some nausea.  Patient denies V/D.

## 2018-01-25 NOTE — Discharge Instructions (Signed)
We will call regarding the ultrasound tomorrow.  Medication as directed.  Take care   Dr. Adriana Simasook

## 2018-01-25 NOTE — ED Provider Notes (Signed)
[facility]  CSN: [id number] Arrival date & time: 01/25/18  1717  History   Chief Complaint Chief Complaint  Patient presents with  . Abdominal Pain    LLQ   HPI  22 year old female presents with left lower quadrant pain.  Started yesterday.  He states that she is 2 weeks late for her menses.  She states that she had a negative home pregnancy test.  She is still concerned that she may be pregnant.  She said some nausea.  Pain is currently mild at 3/10 in severity.  No urinary symptoms.  No known exacerbating relieving factors.  She states that her periods are normally regular.  No other associated symptoms.  No other concerns at this time.  Past Medical History:  Diagnosis Date  . Medical history non-contributory   . Obesity (BMI 35.0-39.9 without comorbidity)    Patient Active Problem List   Diagnosis Date Noted  . Pregnancy 07/12/2015  . Non-reactive NST (non-stress test) 06/05/2015   Past Surgical History:  Procedure Laterality Date  . NO PAST SURGERIES     OB History    Gravida Para Term Preterm AB Living   1 1 1     1    SAB TAB Ectopic Multiple Live Births         0       Home Medications    Prior to Admission medications   Medication Sig Start Date End Date Taking? Authorizing Provider  traMADol (ULTRAM) 50 MG tablet Take 1 tablet (50 mg total) by mouth every 8 (eight) hours as needed. 01/25/18   Tommie Samsook, Kartier Bennison G, DO   Family History Family History  Problem Relation Age of Onset  . Hypertension Maternal Grandfather   . Diabetes Maternal Grandfather   . Heart disease Maternal Grandfather   . Cancer Maternal Grandmother     Social History Social History   Tobacco Use  . Smoking status: Never Smoker  . Smokeless tobacco: Never Used  Substance Use Topics  . Alcohol use: No    Alcohol/week: 0.0 oz  . Drug use: No    Allergies   Patient has no known allergies.   Review of Systems Review of Systems  Constitutional: Negative.     Gastrointestinal: Positive for abdominal pain and nausea.  Genitourinary: Positive for menstrual problem.   Physical Exam Triage Vital Signs ED Triage Vitals  Enc Vitals Group     BP 01/25/18 1756 119/66     Pulse Rate 01/25/18 1756 82     Resp 01/25/18 1756 14     Temp 01/25/18 1756 98.2 F (36.8 C)     Temp Source 01/25/18 1756 Oral     SpO2 01/25/18 1756 100 %     Weight 01/25/18 1753 190 lb (86.2 kg)     Height 01/25/18 1753 5\' 2"  (1.575 m)     Head Circumference --      Peak Flow --      Pain Score 01/25/18 1753 3     Pain Loc --      Pain Edu? --      Excl. in GC? --    Updated Vital Signs BP 119/66 (BP Location: Left Arm)   Pulse 82   Temp 98.2 F (36.8 C) (Oral)   Resp 14   Ht 5\' 2"  (1.575 m)   Wt 190 lb (86.2 kg)   LMP 12/13/2017 (Exact Date)   SpO2 100%   Breastfeeding? No   BMI 34.75 kg/m   Physical  Exam  Constitutional: She is oriented to person, place, and time. She appears well-developed. No distress.  HENT:  Head: Normocephalic and atraumatic.  Eyes: Conjunctivae are normal. Right eye exhibits no discharge. Left eye exhibits no discharge.  Cardiovascular: Normal rate and regular rhythm.  Pulmonary/Chest: Effort normal and breath sounds normal. She has no wheezes. She has no rales.  Abdominal: Soft. She exhibits no distension.  Mild tenderness in the LLQ per patient.  Neurological: She is alert and oriented to person, place, and time.  Psychiatric: She has a normal mood and affect. Her behavior is normal.  Nursing note and vitals reviewed.  UC Treatments / Results  Labs (all labs ordered are listed, but only abnormal results are displayed) Labs Reviewed  URINALYSIS, COMPLETE (UACMP) WITH MICROSCOPIC - Abnormal; Notable for the following components:      Result Value   APPearance HAZY (*)    Specific Gravity, Urine >1.030 (*)    Squamous Epithelial / LPF 21-50 (*)    Bacteria, UA MANY (*)    All other components within normal limits   PREGNANCY, URINE  HCG, QUANTITATIVE, PREGNANCY   EKG  EKG Interpretation None       Radiology No results found.  Procedures Procedures (including critical care time)  Medications Ordered in UC Medications - No data to display   Initial Impression / Assessment and Plan / UC Course  I have reviewed the triage vital signs and the nursing notes.  Pertinent labs & imaging results that were available during my care of the patient were reviewed by me and considered in my medical decision making (see chart for details).    22 year old female presents with LLQ pain.  Patient endorsing pain with the exam.  It seems to be very minimal tenderness.  Her urine pregnancy test and beta hCG were negative.  I am arranging a pelvic ultrasound.  Tramadol as needed for pain.  Final Clinical Impressions(s) / UC Diagnoses   Final diagnoses:  LLQ pain    ED Discharge Orders        Ordered    US PELVIC COMPLETE WITH TRANSVAGINAL     01/25/18 1915    traMADol (ULTRAM) 50 MG tablet  Every 8 hours PRN     01/25/18 1916     Controlled Substance Prescriptions [place] Controlled Substance Registry consulted? Not Applicable   Tommie Sams, [place] 01/25/18 1610

## 2018-02-01 ENCOUNTER — Ambulatory Visit: Payer: Self-pay

## 2018-03-14 ENCOUNTER — Ambulatory Visit
Admission: EM | Admit: 2018-03-14 | Discharge: 2018-03-14 | Disposition: A | Discharge status: Home / Self Care | Payer: Self-pay | Attending: Family Medicine | Admitting: Family Medicine

## 2018-03-14 ENCOUNTER — Other Ambulatory Visit: Payer: Self-pay

## 2018-03-14 DIAGNOSIS — R05 Cough: Secondary | ICD-10-CM

## 2018-03-14 DIAGNOSIS — J029 Acute pharyngitis, unspecified: Secondary | ICD-10-CM

## 2018-03-14 DIAGNOSIS — H9203 Otalgia, bilateral: Secondary | ICD-10-CM

## 2018-03-14 DIAGNOSIS — J069 Acute upper respiratory infection, unspecified: Secondary | ICD-10-CM

## 2018-03-14 LAB — RAPID STREP SCREEN (MED CTR MEBANE ONLY): STREPTOCOCCUS, GROUP A SCREEN (DIRECT): NEGATIVE

## 2018-03-14 MED ORDER — HYDROCOD POLST-CPM POLST ER 10-8 MG/5ML PO SUER: 5.0000 mL | Freq: Two times a day (BID) | ORAL | 0 refills | Status: DC

## 2018-03-14 MED ORDER — BENZONATATE 200 MG PO CAPS: ORAL_CAPSULE | ORAL | 0 refills | Status: DC

## 2018-03-14 MED ORDER — FLUTICASONE PROPIONATE 50 MCG/ACT NA SUSP: 2.0000 | Freq: Every day | NASAL | 0 refills | Status: DC

## 2018-03-14 MED ORDER — AZITHROMYCIN 250 MG PO TABS: 250.0000 mg | ORAL_TABLET | Freq: Every day | ORAL | 0 refills | Status: DC

## 2018-03-14 NOTE — ED Triage Notes (Signed)
Patient complains of cough, congestion, bilateral ear pain x 2 weeks. Hoarseness and sore throat that started yesterday.

## 2018-03-14 NOTE — ED Provider Notes (Signed)
[facility]    CSN: [id number] Arrival date & time: 03/14/18  0807     History   Chief Complaint Chief Complaint  Patient presents with  . Sore Throat    HPI Gina Gomez is a 22 y.o. female.   HPI  22 year old female presents with cough congestion and bilateral ear pain that she has had for 2 weeks.  Hoarseness and sore throat that started just yesterday.  She denies any fever or chills.  At the coughing kept her up all night last night.  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         Past Medical History:  Diagnosis Date  . Medical history non-contributory   . Obesity (BMI 35.0-39.9 without comorbidity)     Patient Active Problem List   Diagnosis Date Noted  . Pregnancy 07/12/2015  . Non-reactive NST (non-stress test) 06/05/2015    Past Surgical History:  Procedure Laterality Date  . NO PAST SURGERIES      OB History    Gravida  1   Para  1   Term  1   Preterm      AB      Living  1     SAB      TAB      Ectopic      Multiple  0   Live Births               Home Medications    Prior to Admission medications   Medication Sig Start Date End Date Taking? Authorizing Provider  azithromycin (ZITHROMAX) 250 MG tablet Take 1 tablet (250 mg total) by mouth daily. Take first 2 tablets together, then 1 every day until finished. 03/14/18   Lutricia Feil, PA-C  benzonatate (TESSALON) 200 MG capsule Take one cap TID PRN cough 03/14/18   Lutricia Feil, PA-C  chlorpheniramine-HYDROcodone [facility]) 10-8 MG/5ML SUER Take 5 mLs by mouth 2 (two) times daily. 03/14/18   Lutricia Feil, PA-C  fluticasone (FLONASE) 50 MCG/ACT nasal spray Place 2 sprays into both nostrils daily. 03/14/18   Lutricia Feil, PA-C    Family History Family History  Problem Relation Age of Onset  . Hypertension Maternal Grandfather   . Diabetes Maternal Grandfather   . Heart disease Maternal Grandfather   . Cancer Maternal  Grandmother     Social History Social History   Tobacco Use  . Smoking status: Never Smoker  . Smokeless tobacco: Never Used  Substance Use Topics  . Alcohol use: No    Alcohol/week: 0.0 oz  . Drug use: No     Allergies   Patient has no known allergies.   Review of Systems Review of Systems  Constitutional: Positive for activity change, chills and fatigue. Negative for fever.  HENT: Positive for congestion, postnasal drip, sore throat and voice change.   Respiratory: Positive for cough. Negative for shortness of breath.   All other systems reviewed and are negative.    Physical Exam Triage Vital Signs ED Triage Vitals  Enc Vitals Group     BP 03/14/18 0820 138/89     Pulse Rate 03/14/18 0820 (!) 101     Resp 03/14/18 0820 18     Temp 03/14/18 0820 98.5 F (36.9 C)     Temp Source 03/14/18 0820 Oral     SpO2 03/14/18 0820 100 %     Weight 03/14/18 0819 190 lb (86.2 kg)  Height 03/14/18 0819 5\' 2"  (1.575 m)     Head Circumference --      Peak Flow --      Pain Score 03/14/18 0819 10     Pain Loc --      Pain Edu? --      Excl. in GC? --    No data found.  Updated Vital Signs BP 138/89 (BP Location: Left Arm)   Pulse (!) 101   Temp 98.5 F (36.9 C) (Oral)   Resp 18   Ht 5\' 2"  (1.575 m)   Wt 190 lb (86.2 kg)   LMP 03/06/2018   SpO2 100%   BMI 34.75 kg/m   Visual Acuity Right Eye Distance:   Left Eye Distance:   Bilateral Distance:    Right Eye Near:   Left Eye Near:    Bilateral Near:     Physical Exam  Constitutional: She is oriented to person, place, and time. She appears well-developed and well-nourished.  Non-toxic appearance. She does not appear ill. No distress.  HENT:  Head: Normocephalic.  Right Ear: Hearing, tympanic membrane and ear canal normal.  Left Ear: Hearing, tympanic membrane and ear canal normal.  Mouth/Throat: Mucous membranes are normal. No oral lesions. No uvula swelling. Posterior oropharyngeal erythema present. No  oropharyngeal exudate, posterior oropharyngeal edema or tonsillar abscesses. Tonsils are 1+ on the right. Tonsils are 1+ on the left. No tonsillar exudate.  Neck: Normal range of motion.  Neurological: She is alert and oriented to person, place, and time.  Skin: Skin is warm and dry.  Psychiatric: She has a normal mood and affect. Her behavior is normal.  Nursing note and vitals reviewed.    UC Treatments / Results  Labs (all labs ordered are listed, but only abnormal results are displayed) Labs Reviewed  RAPID STREP SCREEN ([facility] ONLY)  CULTURE, GROUP A STREP [facility])    EKG None Radiology No results found.  Procedures Procedures (including critical care time)  Medications Ordered in UC Medications - No data to display   Initial Impression / Assessment and Plan / UC Course  I have reviewed the triage vital signs and the nursing notes.  Pertinent labs & imaging results that were available during my care of the patient were reviewed by me and considered in my medical decision making (see chart for details).     Plan: 1. Test/x-ray results and diagnosis reviewed with patient 2. rx as per orders; risks, benefits, potential side effects reviewed with patient 3. Recommend supportive treatment with rest and fluids.  She has had this for 2 weeks and has not found relief I will provide her with azithromycin.  Also treat with cough suppressants and Flonase.  If she is not improving she should follow-up with her primary care or return to our clinic 4. F/u prn if symptoms worsen or don't improve   Final Clinical Impressions(s) / UC Diagnoses   Final diagnoses:  Upper respiratory tract infection, unspecified type    ED Discharge Orders        Ordered    benzonatate (TESSALON) 200 MG capsule     03/14/18 0839    chlorpheniramine-HYDROcodone (TUSSIONEX PENNKINETIC ER) 10-8 MG/5ML SUER  2 times daily     03/14/18 0839    azithromycin (ZITHROMAX) 250 MG tablet  Daily      03/14/18 0839    fluticasone (FLONASE) 50 MCG/ACT nasal spray  Daily     03/14/18 0839  Controlled Substance Prescriptions [place] Controlled Substance Registry consulted? Not Applicable   Lutricia FeilRoemer, Jahn Franchini P, PA-C 03/14/18 [id number]

## 2018-03-16 LAB — CULTURE, GROUP A STREP (THRC)

## 2019-09-14 ENCOUNTER — Other Ambulatory Visit: Payer: Self-pay

## 2019-09-14 ENCOUNTER — Ambulatory Visit: Payer: Self-pay

## 2019-09-14 DIAGNOSIS — Z111 Encounter for screening for respiratory tuberculosis: Secondary | ICD-10-CM

## 2019-09-14 NOTE — Progress Notes (Signed)
Patient had PPD placed at [facility] on 09/11/19. PPD was read at 30mm at 48 hours on 09/13/19 at [facility]    Patient in clinic at the [facility] today (09/14/19) for a 72 hour PPD read with no induration. There is some redness at injection site however PPD is negative.  Instructed patient to have employer call with any questions [phone].   PPDR 20mm (negative) 09/14/19 Aileen Fass, RN

## 2019-12-13 DIAGNOSIS — Z8632 Personal history of gestational diabetes: Secondary | ICD-10-CM

## 2019-12-14 ENCOUNTER — Ambulatory Visit: Payer: Self-pay

## 2019-12-25 ENCOUNTER — Ambulatory Visit: Payer: Self-pay

## 2020-01-02 ENCOUNTER — Ambulatory Visit (LOCAL_COMMUNITY_HEALTH_CENTER): Payer: Self-pay | Admitting: Advanced Practice Midwife

## 2020-01-02 ENCOUNTER — Other Ambulatory Visit: Payer: Self-pay

## 2020-01-02 ENCOUNTER — Encounter: Payer: Self-pay | Admitting: Advanced Practice Midwife

## 2020-01-02 DIAGNOSIS — Z6839 Body mass index (BMI) 39.0-39.9, adult: Secondary | ICD-10-CM

## 2020-01-02 DIAGNOSIS — E669 Obesity, unspecified: Secondary | ICD-10-CM | POA: Insufficient documentation

## 2020-01-02 NOTE — Progress Notes (Signed)
   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 Annual Exam    HPI Pt last seen here by [place] 12/2015.  No pap in our history.  Wants physical for work today.  Doesn't want any birth control, no STD screen, asymptomatic.  Lives in [place].  Has no insurance or Medicaid.  Pt arrived late for appointment  Does the patient have a current or past history of drug use? No   No components found for: HCV]   Health Maintenance Due  Topic Date Due  . HEMOGLOBIN A1C  1995/03/19  . PNEUMOCOCCAL POLYSACCHARIDE VACCINE AGE 60-64 HIGH RISK  04/12/1997  . FOOT EXAM  04/12/2005  . OPHTHALMOLOGY EXAM  04/12/2005  . URINE MICROALBUMIN  04/12/2005  . TETANUS/TDAP  04/12/2014  . PAP-Cervical Cytology Screening  04/12/2016  . PAP SMEAR-Modifier  04/12/2016  . INFLUENZA VACCINE  07/01/2019    ROS  The following portions of the patient's history were reviewed and updated as appropriate: allergies, current medications, past family history, past medical history, past social history, past surgical history and problem list. Problem list updated.   See flowsheet for other program required questions.  Objective:   Vitals:   01/02/20 1405  BP: 117/76  Weight: 217 lb 3.2 oz (98.5 kg)  Height: 5\' 2"  (1.575 m)    Physical Exam  n/a  Assessment and Plan:  Gina Gomez is a 24 y.o. female presenting to the [facility] Department for a Women's Health problem visit  1. Class 2 obesity with body mass index (BMI) of 39.0 to 39.9 in adult, unspecified obesity type, unspecified whether serious comorbidity present  Explained to pt that her appt was not made for a physical, but a problem visit (by [facility]), and we are not doing physicals due to COVID. Referred to primary care MD or Marigene Ehlers for physical.  Pt asymptomatic  and has no c/o.    Return if symptoms worsen or fail to improve.  No future appointments.  Phineas Real, CNM

## 2020-01-02 NOTE — Progress Notes (Signed)
TB screening forms completed. Jossie Ng, RN

## 2020-08-11 ENCOUNTER — Other Ambulatory Visit: Payer: Self-pay

## 2020-08-11 DIAGNOSIS — U071 COVID-19: Secondary | ICD-10-CM | POA: Diagnosis not present

## 2020-08-11 DIAGNOSIS — Z79899 Other long term (current) drug therapy: Secondary | ICD-10-CM | POA: Diagnosis not present

## 2020-08-11 DIAGNOSIS — R1030 Lower abdominal pain, unspecified: Secondary | ICD-10-CM | POA: Insufficient documentation

## 2020-08-11 DIAGNOSIS — R519 Headache, unspecified: Secondary | ICD-10-CM | POA: Diagnosis present

## 2020-08-11 DIAGNOSIS — R112 Nausea with vomiting, unspecified: Secondary | ICD-10-CM | POA: Insufficient documentation

## 2020-08-11 LAB — COMPREHENSIVE METABOLIC PANEL
ALT: 23 U/L (ref 0–44)
AST: 20 U/L (ref 15–41)
Albumin: 4.5 g/dL (ref 3.5–5.0)
Alkaline Phosphatase: 96 U/L (ref 38–126)
Anion gap: 11 (ref 5–15)
BUN: 12 mg/dL (ref 6–20)
CO2: 25 mmol/L (ref 22–32)
Calcium: 9 mg/dL (ref 8.9–10.3)
Chloride: 101 mmol/L (ref 98–111)
Creatinine, Ser: 0.63 mg/dL (ref 0.44–1.00)
GFR calc Af Amer: >60 mL/min (ref >60)
GFR calc non Af Amer: >60 mL/min (ref >60)
Glucose, Bld: 120 mg/dL — ABNORMAL HIGH (ref 70–99)
Potassium: 3.4 mmol/L — ABNORMAL LOW (ref 3.5–5.1)
Sodium: 137 mmol/L (ref 135–145)
Total Bilirubin: 0.5 mg/dL (ref 0.3–1.2)
Total Protein: 7.9 g/dL (ref 6.5–8.1)

## 2020-08-11 LAB — CBC
HCT: 39.3 % (ref 36.0–46.0)
Hemoglobin: 13.6 g/dL (ref 12.0–15.0)
MCH: 28 pg (ref 26.0–34.0)
MCHC: 34.6 g/dL (ref 30.0–36.0)
MCV: 80.9 fL (ref 80.0–100.0)
Platelets: 233 10*3/uL (ref 150–400)
RBC: 4.86 MIL/uL (ref 3.87–5.11)
RDW: 13.1 % (ref 11.5–15.5)
WBC: 5.6 10*3/uL (ref 4.0–10.5)
nRBC: 0 % (ref 0.0–0.2)

## 2020-08-11 NOTE — ED Triage Notes (Signed)
Pt states is here for lower below navel abd pain, headache. Pt states her husband currently has COVID. Pt in no acute distress, resps unlabored.

## 2020-08-12 ENCOUNTER — Emergency Department
Admission: EM | Admit: 2020-08-12 | Discharge: 2020-08-12 | Disposition: A | Discharge status: Home / Self Care | Payer: HRSA Program | Attending: Emergency Medicine | Admitting: Emergency Medicine

## 2020-08-12 DIAGNOSIS — R519 Headache, unspecified: Secondary | ICD-10-CM

## 2020-08-12 DIAGNOSIS — U071 COVID-19: Secondary | ICD-10-CM

## 2020-08-12 DIAGNOSIS — R112 Nausea with vomiting, unspecified: Secondary | ICD-10-CM

## 2020-08-12 DIAGNOSIS — R1030 Lower abdominal pain, unspecified: Secondary | ICD-10-CM

## 2020-08-12 LAB — SARS CORONAVIRUS 2 BY RT PCR (HOSPITAL ORDER, PERFORMED IN ~~LOC~~ HOSPITAL LAB): SARS Coronavirus 2: POSITIVE — AB

## 2020-08-12 LAB — URINALYSIS, COMPLETE (UACMP) WITH MICROSCOPIC
Bilirubin Urine: NEGATIVE
Glucose, UA: NEGATIVE mg/dL
Ketones, ur: NEGATIVE mg/dL
Nitrite: NEGATIVE
Protein, ur: 30 mg/dL — AB
RBC / HPF: >50 RBC/hpf — ABNORMAL HIGH (ref 0–5)
Specific Gravity, Urine: 1.028 (ref 1.005–1.030)
pH: 5 (ref 5.0–8.0)

## 2020-08-12 MED ORDER — ONDANSETRON 4 MG PO TBDP: 4.0000 mg | ORAL_TABLET | Freq: Three times a day (TID) | ORAL | 0 refills | Status: DC | PRN

## 2020-08-12 NOTE — ED Notes (Signed)
Pt tolerating PO fluids

## 2020-08-12 NOTE — ED Provider Notes (Signed)
[facility] Emergency Department Provider Note   ____________________________________________   First MD Initiated Contact with Patient 08/12/20 [phone]     (approximate)  I have reviewed the triage vital signs and the nursing notes.   HISTORY  Chief Complaint Headache and Abdominal Pain    HPI Gina Gomez is a 24 y.o. female with no stated past medical history who presents for 2 days of worsening headache, cough, and lower abdominal pain in the setting of one episode of nausea/vomiting yesterday.  Patient describes 8/10, nonradiating, throbbing headache that has been worsening over the last 2 days and is associated with decreased p.o. intake.  Patient states that her husband at home was tested positive for Covid recently         Past Medical History:  Diagnosis Date  . Medical history non-contributory   . Obesity (BMI 35.0-39.9 without comorbidity)     Patient Active Problem List   Diagnosis Date Noted  . Obesity BMI=39.7 01/02/2020  . History of gestational diabetes 12/20/2015    Past Surgical History:  Procedure Laterality Date  . NO PAST SURGERIES      Prior to Admission medications   Medication Sig Start Date End Date Taking? Authorizing Provider  azithromycin (ZITHROMAX) 250 MG tablet Take 1 tablet (250 mg total) by mouth daily. Take first 2 tablets together, then 1 every day until finished. Patient not taking: Reported on 01/02/2020 03/14/18   Lutricia Feil, PA-C  benzonatate (TESSALON) 200 MG capsule Take one cap TID PRN cough Patient not taking: Reported on 01/02/2020 03/14/18   Lutricia Feil, PA-C  chlorpheniramine-HYDROcodone [facility] PENNKINETIC ER) 10-8 MG/5ML SUER Take 5 mLs by mouth 2 (two) times daily. Patient not taking: Reported on 01/02/2020 03/14/18   Ovid Curd P, PA-C  fluticasone [facility]) 50 MCG/ACT nasal spray Place 2 sprays into both nostrils daily. Patient not taking: Reported on 01/02/2020 03/14/18   Ovid Curd P, PA-C  ondansetron (ZOFRAN ODT) 4 MG disintegrating tablet Take 1 tablet (4 mg total) by mouth every 8 (eight) hours as needed for nausea or vomiting. 08/12/20   Merwyn Katos, MD    Allergies Patient has no known allergies.  Family History  Problem Relation Age of Onset  . Hypertension Maternal Grandfather   . Diabetes Maternal Grandfather   . Heart disease Maternal Grandfather   . Cancer Maternal Grandmother   . Ovarian cysts Mother   . Heart disease Paternal Grandmother   . High Cholesterol Paternal Grandmother     Social History Social History   Tobacco Use  . Smoking status: Never Smoker  . Smokeless tobacco: Never Used  Vaping Use  . Vaping Use: Never used  Substance Use Topics  . Alcohol use: No    Alcohol/week: 0.0 standard drinks  . Drug use: No    Review of Systems Constitutional: No fever/chills Eyes: No visual changes. ENT: No sore throat. Cardiovascular: Denies chest pain. Respiratory: Denies shortness of breath. Gastrointestinal: Endorses abdominal pain.  Endorses nausea/vomiting.  Denies diarrhea/constipation Genitourinary: Negative for dysuria. Musculoskeletal: Negative for acute arthralgias Skin: Negative for rash. Neurological: Negative for headaches, weakness/numbness/paresthesias in any extremity Psychiatric: Negative for suicidal ideation/homicidal ideation   ____________________________________________   PHYSICAL EXAM:  VITAL SIGNS: ED Triage Vitals  Enc Vitals Group     BP 08/11/20 2212 140/83     Pulse Rate 08/11/20 2212 (!) 120     Resp 08/11/20 2212 18     Temp 08/11/20 2212 100 F (37.8  C)     Temp src --      SpO2 08/11/20 2212 98 %     Weight 08/11/20 2213 220 lb (99.8 kg)     Height 08/11/20 2213 5\' 2"  (1.575 m)     Head Circumference --      Peak Flow --      Pain Score 08/11/20 2213 8     Pain Loc --      Pain Edu? --      Excl. in GC? --    Constitutional: Alert and oriented. Well appearing and in no  acute distress. Eyes: Conjunctivae are normal. PERRL. EOMI. Head: Atraumatic. Nose: No congestion/rhinnorhea. Mouth/Throat: Mucous membranes are moist. Neck: No stridor Cardiovascular: Normal rate, regular rhythm. Grossly normal heart sounds.  Good peripheral circulation. Respiratory: Normal respiratory effort.  No retractions. Gastrointestinal: Soft and nontender. No distention. Musculoskeletal: No lower extremity tenderness nor edema.  No joint effusions. Neurologic:  Normal speech and language. No gross focal neurologic deficits are appreciated. Skin:  Skin is warm and dry. No rash noted. Psychiatric: Mood and affect are normal. Speech and behavior are normal.  ____________________________________________   LABS (all labs ordered are listed, but only abnormal results are displayed)  Labs Reviewed  SARS CORONAVIRUS 2 BY RT PCR (HOSPITAL ORDER, PERFORMED IN [facility]) - Abnormal; Notable for the following components:      Result Value   SARS Coronavirus 2 POSITIVE (*)    All other components within normal limits  COMPREHENSIVE METABOLIC PANEL - Abnormal; Notable for the following components:   Potassium 3.4 (*)    Glucose, Bld 120 (*)    All other components within normal limits  URINALYSIS, COMPLETE (UACMP) WITH MICROSCOPIC - Abnormal; Notable for the following components:   Color, Urine YELLOW (*)    APPearance CLOUDY (*)    Hgb urine dipstick LARGE (*)    Protein, ur 30 (*)    Leukocytes,Ua SMALL (*)    RBC / HPF >50 (*)    Bacteria, UA RARE (*)    All other components within normal limits  CBC  POC URINE PREG, ED  POC SARS CORONAVIRUS 2 AG -  ED   __________________________________________________________________________   PROCEDURES  Procedure(s) performed (including Critical Care):  Procedures   ____________________________________________   INITIAL IMPRESSION / ASSESSMENT AND PLAN / ED COURSE        Presentation most consistent with  Viral Syndrome.  Patient has tested positive for COVID-19. Based on vitals and exam they are nontoxic and stable for discharge.  Given History and Exam I have a lower suspicion for: Emergent CardioPulmonary causes [such as Acute Asthma or COPD Exacerbation, acute Heart Failure or exacerbation, PE, PTX, atypical ACS, PNA]. Emergent Otolaryngeal causes [such as PTA, RPA, Ludwigs, Epiglottitis, EBV].  Regarding Emergent Travel or Immunosuppressive related infectious: I have a low suspicion for acute HIV.  Will provide strict return precautions and instructions on self-isolation/quarantine and anticipatory guidance.      ____________________________________________   FINAL CLINICAL IMPRESSION(S) / ED DIAGNOSES  Final diagnoses:  COVID-19 virus infection  Acute nonintractable headache, unspecified headache type  Nausea and vomiting, intractability of vomiting not specified, unspecified vomiting type  Lower abdominal pain     ED Discharge Orders         Ordered    ondansetron (ZOFRAN ODT) 4 MG disintegrating tablet  Every 8 hours PRN        08/12/20 0752  Note:  This document was prepared using Dragon voice recognition software and may include unintentional dictation errors.   Merwyn Katos, MD 08/12/20 [phone]

## 2020-08-12 NOTE — ED Notes (Signed)
Patient verbalizes understanding of discharge instructions. Opportunity for questioning and answers were provided. Armband removed by staff, pt discharged from ED. Ambulated out to lobby  

## 2020-08-13 ENCOUNTER — Telehealth: Payer: Self-pay | Admitting: Physician Assistant

## 2020-08-13 LAB — POCT PREGNANCY, URINE: Preg Test, Ur: NEGATIVE

## 2020-08-13 NOTE — Telephone Encounter (Signed)
  Called to discuss with patient about Covid symptoms and the use of casirivimab/imdevimab, a monoclonal antibody infusion for those with mild to moderate Covid symptoms and at a high risk of hospitalization.  Pt is qualified for this infusion at the [facility] infusion center due  BMI > 25.    No voice mail set up. Sent my chart message.   Manson Passey, PA - C

## 2020-09-29 LAB — FETAL NONSTRESS TEST

## 2020-10-06 LAB — FETAL NONSTRESS TEST

## 2020-11-30 NOTE — L&D Delivery Note (Signed)
Delivery Note Called to the room as the patient was found to be completely dilated. Within two contractions, and at  1535, a viable female was delivered via  vaginal delivery over a 1st degree laceration.(Presentation:   direct OA, with restitution to ROT   ).  APGAR: 89, ; weight  .pending   Placenta status: delivered at 1542 intact,  .  Cord:  3 vessel with the following complications: none .  Cord pH: NA  Anesthesia:  epidural Episiotomy:  none Lacerations:  1st degree Suture Repair: vicryl rapide Est. Blood Loss (mL):  275  Mom to postpartum.  Baby to Couplet care / Skin to Skin.  Mirna Mires 10/15/2021, 4:12 PM

## 2021-02-14 ENCOUNTER — Emergency Department
Admission: EM | Admit: 2021-02-14 | Discharge: 2021-02-14 | Disposition: A | Discharge status: Home / Self Care | Payer: BLUE CROSS/BLUE SHIELD | Attending: Emergency Medicine | Admitting: Emergency Medicine

## 2021-02-14 ENCOUNTER — Emergency Department: Payer: BLUE CROSS/BLUE SHIELD

## 2021-02-14 ENCOUNTER — Encounter: Payer: Self-pay | Admitting: Radiology

## 2021-02-14 ENCOUNTER — Other Ambulatory Visit: Payer: Self-pay

## 2021-02-14 DIAGNOSIS — R1030 Lower abdominal pain, unspecified: Secondary | ICD-10-CM | POA: Diagnosis not present

## 2021-02-14 DIAGNOSIS — Z3A01 Less than 8 weeks gestation of pregnancy: Secondary | ICD-10-CM | POA: Insufficient documentation

## 2021-02-14 DIAGNOSIS — R1031 Right lower quadrant pain: Secondary | ICD-10-CM

## 2021-02-14 DIAGNOSIS — O26891 Other specified pregnancy related conditions, first trimester: Secondary | ICD-10-CM | POA: Diagnosis present

## 2021-02-14 DIAGNOSIS — O3680X0 Pregnancy with inconclusive fetal viability, not applicable or unspecified: Secondary | ICD-10-CM | POA: Diagnosis not present

## 2021-02-14 DIAGNOSIS — R1032 Left lower quadrant pain: Secondary | ICD-10-CM

## 2021-02-14 LAB — COMPREHENSIVE METABOLIC PANEL
ALT: 15 U/L (ref 0–44)
AST: 15 U/L (ref 15–41)
Albumin: 4.3 g/dL (ref 3.5–5.0)
Alkaline Phosphatase: 79 U/L (ref 38–126)
Anion gap: 8 (ref 5–15)
BUN: 12 mg/dL (ref 6–20)
CO2: 23 mmol/L (ref 22–32)
Calcium: 9 mg/dL (ref 8.9–10.3)
Chloride: 105 mmol/L (ref 98–111)
Creatinine, Ser: 0.61 mg/dL (ref 0.44–1.00)
GFR, Estimated: >60 mL/min (ref >60)
Glucose, Bld: 92 mg/dL (ref 70–99)
Potassium: 4 mmol/L (ref 3.5–5.1)
Sodium: 136 mmol/L (ref 135–145)
Total Bilirubin: 0.5 mg/dL (ref 0.3–1.2)
Total Protein: 7.6 g/dL (ref 6.5–8.1)

## 2021-02-14 LAB — CBC
HCT: 41.4 % (ref 36.0–46.0)
Hemoglobin: 13.4 g/dL (ref 12.0–15.0)
MCH: 27.3 pg (ref 26.0–34.0)
MCHC: 32.4 g/dL (ref 30.0–36.0)
MCV: 84.5 fL (ref 80.0–100.0)
Platelets: 281 10*3/uL (ref 150–400)
RBC: 4.9 MIL/uL (ref 3.87–5.11)
RDW: 13.3 % (ref 11.5–15.5)
WBC: 14.2 10*3/uL — ABNORMAL HIGH (ref 4.0–10.5)
nRBC: 0 % (ref 0.0–0.2)

## 2021-02-14 LAB — URINALYSIS, COMPLETE (UACMP) WITH MICROSCOPIC
Bacteria, UA: NONE SEEN
Bilirubin Urine: NEGATIVE
Glucose, UA: NEGATIVE mg/dL
Hgb urine dipstick: NEGATIVE
Ketones, ur: NEGATIVE mg/dL
Nitrite: NEGATIVE
Protein, ur: NEGATIVE mg/dL
Specific Gravity, Urine: 1.015 (ref 1.005–1.030)
pH: 6 (ref 5.0–8.0)

## 2021-02-14 LAB — HCG, QUANTITATIVE, PREGNANCY: hCG, Beta Chain, Quant, S: 119 m[IU]/mL — ABNORMAL HIGH (ref <5)

## 2021-02-14 NOTE — ED Triage Notes (Signed)
Pt states she found out she was pregnant yesterday. Pt states she has been having abdominal cramping for 3-5 days. Pt states similar episode with previous child. Pt was told to come in by [facility] nurse. Pt denies vaginal bleeding or discharge.

## 2021-02-14 NOTE — Discharge Instructions (Addendum)
Your blood pregnancy test is positive today but your ultrasound is unable to identify a pregnancy.  This can occur during an early normal pregnancy, and ectopic pregnancy, or a miscarriage.  Your beta hCG (pregnancy hormone level) is 119 today 02/14/2021.  Please follow-up with OB/GYN in 48 hours to have this lab rechecked.  Return to the emergency department for any worsening or return of abdominal pain, significant vaginal bleeding, or any other symptom personally concerning to yourself.

## 2021-02-14 NOTE — ED Provider Notes (Signed)
[facility] Emergency Department Provider Note  Time seen: 5:06 AM  I have reviewed the triage vital signs and the nursing notes.   HISTORY  Chief Complaint Abdominal Cramping   HPI Gina Gomez is a 25 y.o. female G2, P1 presents emergency department for lower abdominal cramping.  According to the patient over the past 3 days or so she has been experiencing lower abdominal cramping.  States she was several days late on her menstrual period so she took a pregnancy test that was positive.  Patient called her prior OB and they recommended due to the cramping that she come to the emergency department for evaluation.  Patient denies any vaginal bleeding or discharge.   Patient appears well.  No distress.  Largely negative review of systems.  Past Medical History:  Diagnosis Date  . Medical history non-contributory   . Obesity (BMI 35.0-39.9 without comorbidity)     Patient Active Problem List   Diagnosis Date Noted  . Obesity BMI=39.7 01/02/2020  . History of gestational diabetes 12/20/2015    Past Surgical History:  Procedure Laterality Date  . NO PAST SURGERIES      Prior to Admission medications   Medication Sig Start Date End Date Taking? Authorizing Provider  azithromycin (ZITHROMAX) 250 MG tablet Take 1 tablet (250 mg total) by mouth daily. Take first 2 tablets together, then 1 every day until finished. Patient not taking: Reported on 01/02/2020 03/14/18   Lutricia Feil, PA-C  benzonatate (TESSALON) 200 MG capsule Take one cap TID PRN cough Patient not taking: Reported on 01/02/2020 03/14/18   Lutricia Feil, PA-C  chlorpheniramine-HYDROcodone [facility] PENNKINETIC ER) 10-8 MG/5ML SUER Take 5 mLs by mouth 2 (two) times daily. Patient not taking: Reported on 01/02/2020 03/14/18   Ovid Curd P, PA-C  fluticasone [facility]) 50 MCG/ACT nasal spray Place 2 sprays into both nostrils daily. Patient not taking: Reported on 01/02/2020 03/14/18   Ovid Curd P, PA-C  ondansetron (ZOFRAN ODT) 4 MG disintegrating tablet Take 1 tablet (4 mg total) by mouth every 8 (eight) hours as needed for nausea or vomiting. 08/12/20   Merwyn Katos, MD    No Known Allergies  Family History  Problem Relation Age of Onset  . Hypertension Maternal Grandfather   . Diabetes Maternal Grandfather   . Heart disease Maternal Grandfather   . Cancer Maternal Grandmother   . Ovarian cysts Mother   . Heart disease Paternal Grandmother   . High Cholesterol Paternal Grandmother     Social History Social History   Tobacco Use  . Smoking status: Never Smoker  . Smokeless tobacco: Never Used  Vaping Use  . Vaping Use: Never used  Substance Use Topics  . Alcohol use: No    Alcohol/week: 0.0 standard drinks  . Drug use: No    Review of Systems Constitutional: Negative for fever. Cardiovascular: Negative for chest pain. Respiratory: Negative for shortness of breath. Gastrointestinal: Moderate intermittent lower abdominal cramping. Genitourinary: Negative for urinary compaints.  Negative for vaginal bleeding or discharge. Musculoskeletal: Negative for musculoskeletal complaints Neurological: Negative for headache All other ROS negative  ____________________________________________   PHYSICAL EXAM:  VITAL SIGNS: ED Triage Vitals  Enc Vitals Group     BP 02/14/21 0016 (!) 141/91     Pulse Rate 02/14/21 0016 92     Resp 02/14/21 0016 17     Temp 02/14/21 0016 98.6 F (37 C)     Temp src --  SpO2 02/14/21 0016 100 %     Weight 02/14/21 0016 215 lb (97.5 kg)     Height 02/14/21 0016 5\' 2"  (1.575 m)     Head Circumference --      Peak Flow --      Pain Score 02/14/21 0015 4     Pain Loc --      Pain Edu? --      Excl. in GC? --    Constitutional: Alert and oriented. Well appearing and in no distress. Eyes: Normal exam ENT      Head: Normocephalic and atraumatic.      Mouth/Throat: Mucous membranes are moist. Cardiovascular: Normal  rate, regular rhythm.  Respiratory: Normal respiratory effort without tachypnea nor retractions. Breath sounds are clear  Gastrointestinal: Soft and nontender. No distention. Musculoskeletal: Nontender with normal range of motion in all extremities. Neurologic:  Normal speech and language. No gross focal neurologic deficits  Skin:  Skin is warm, dry and intact.  Psychiatric: Mood and affect are normal.   ____________________________________________   RADIOLOGY  Ultrasound shows no identifiable pregnancy  ____________________________________________   INITIAL IMPRESSION / ASSESSMENT AND PLAN / ED COURSE  Pertinent labs & imaging results that were available during my care of the patient were reviewed by me and considered in my medical decision making (see chart for details).   Patient presents emergency department for lower abdominal cramping.  States she found out she is pregnant 2 days ago.  No vaginal bleeding or discharge.  Last menstrual period was February 16 per patient.  Patient's lab work is largely nonrevealing there is a slight leukocytosis.  Beta hCG 119.  No fever.  Benign abdomen.  We will proceed with a OB ultrasound to further evaluate and help rule out ectopic pregnancy.  Patient agreeable to plan of care.  Ultrasound unable to identify a pregnancy.  Differential this time would include ectopic versus early pregnancy versus miscarriage.  Discussed with the patient follow-up with OB/GYN in 48 hours for recheck of her beta hCG.  Patient agreeable to plan of care.  Discussed return precautions.  Gina Gomez was evaluated in Emergency Department on 02/14/2021 for the symptoms described in the history of present illness. She was evaluated in the context of the global COVID-19 pandemic, which necessitated consideration that the patient might be at risk for infection with the SARS-CoV-2 virus that causes COVID-19. Institutional protocols and algorithms that pertain to the  evaluation of patients at risk for COVID-19 are in a state of rapid change based on information released by regulatory bodies including the CDC and federal and state organizations. These policies and algorithms were followed during the patient's care in the ED.  ____________________________________________   FINAL CLINICAL IMPRESSION(S) / ED DIAGNOSES  Abdominal pain during pregnancy   02/16/2021, MD 02/14/21 [id number]

## 2021-02-17 ENCOUNTER — Other Ambulatory Visit: Payer: Self-pay

## 2021-02-17 ENCOUNTER — Ambulatory Visit (INDEPENDENT_AMBULATORY_CARE_PROVIDER_SITE_OTHER): Payer: Medicaid Other | Admitting: Obstetrics and Gynecology

## 2021-02-17 ENCOUNTER — Encounter: Payer: Self-pay | Admitting: Obstetrics and Gynecology

## 2021-02-17 VITALS — BP 124/76 | Ht 62.0 in | Wt 218.0 lb

## 2021-02-17 DIAGNOSIS — O3680X0 Pregnancy with inconclusive fetal viability, not applicable or unspecified: Secondary | ICD-10-CM

## 2021-02-17 NOTE — Progress Notes (Signed)
g  Obstetric Problem Visit    Chief Complaint:  Chief Complaint  Patient presents with  . Follow-up    ER F/U. RM 5    History of Present Illness: Patient is a 25 y.o. G2P1001 Unknown presenting for pregnancy of unknown location.    Is bleeding present:  No Any recent trauma:  No History of prior miscarriage:  No HIistory of prior ectopic No Prior ultrasound demonstrating IUP: no, ultrasound without evidence of IUP on 02/14/2021 ER visit but HCG well below discriminatory zone Prior ultrasound demonstrating viable IUP:  No Prior Serum HCG:  Yes  119 mIU/mL on 02/14/2021 ER visit Rh status: O POS   No bleeding has some mild cramping.  No significant abdominal pain.    Review of Systems: Review of Systems  Gastrointestinal: Negative.   Genitourinary: Negative.   Skin: Negative.     Past Medical History:  Patient Active Problem List   Diagnosis Date Noted  . Obesity BMI=39.7 01/02/2020  . History of gestational diabetes 12/20/2015    Past Surgical History:  Past Surgical History:  Procedure Laterality Date  . NO PAST SURGERIES      Obstetric History: G2P1001  Family History:  Family History  Problem Relation Age of Onset  . Hypertension Maternal Grandfather   . Diabetes Maternal Grandfather   . Heart disease Maternal Grandfather   . Cancer Maternal Grandmother   . Ovarian cysts Mother   . Heart disease Paternal Grandmother   . High Cholesterol Paternal Grandmother     Social History:  Social History   Socioeconomic History  . Marital status: Married    Spouse name: Not on file  . Number of children: Not on file  . Years of education: Not on file  . Highest education level: Not on file  Occupational History  . Not on file  Tobacco Use  . Smoking status: Never Smoker  . Smokeless tobacco: Never Used  Vaping Use  . Vaping Use: Never used  Substance and Sexual Activity  . Alcohol use: No    Alcohol/week: 0.0 standard drinks  . Drug use: No  . Sexual  activity: Yes  Other Topics Concern  . Not on file  Social History Narrative  . Not on file   Social Determinants of Health   Financial Resource Strain: Not on file  Food Insecurity: Not on file  Transportation Needs: Not on file  Physical Activity: Not on file  Stress: Not on file  Social Connections: Not on file  Intimate Partner Violence: Not on file    Allergies:  No Known Allergies  Medications: Prior to Admission medications   Medication Sig Start Date End Date Taking? Authorizing Provider  azithromycin (ZITHROMAX) 250 MG tablet Take 1 tablet (250 mg total) by mouth daily. Take first 2 tablets together, then 1 every day until finished. Patient not taking: Reported on 01/02/2020 03/14/18   Lutricia Feil, PA-C  benzonatate (TESSALON) 200 MG capsule Take one cap TID PRN cough Patient not taking: Reported on 01/02/2020 03/14/18   Lutricia Feil, PA-C  chlorpheniramine-HYDROcodone [facility] PENNKINETIC ER) 10-8 MG/5ML SUER Take 5 mLs by mouth 2 (two) times daily. Patient not taking: Reported on 01/02/2020 03/14/18   Ovid Curd P, PA-C  fluticasone [facility]) 50 MCG/ACT nasal spray Place 2 sprays into both nostrils daily. Patient not taking: Reported on 01/02/2020 03/14/18   Ovid Curd P, PA-C  ondansetron (ZOFRAN ODT) 4 MG disintegrating tablet Take 1 tablet (4 mg total) by mouth every  8 (eight) hours as needed for nausea or vomiting. 08/12/20   Merwyn Katos, MD    Physical Exam Vitals: Blood pressure 124/76, height 5\' 2"  (1.575 m), weight 218 lb (98.9 kg), last menstrual period 01/15/2021.  General: NAD, appears stated age HEENT: normocephalic, anicteric Pulmonary: No increased work of breathing, Extremities: no edema, erythema, or tenderness Neurologic: Grossly intact Psychiatric: mood appropriate, affect full  Assessment: 25 y.o. G2P1001 Unknown presenting for evaluation of first trimester vaginal bleeding  Plan: Problem List Items Addressed This Visit    None   Visit Diagnoses    Pregnancy of unknown anatomic location    -  Primary   Relevant Orders   Beta hCG quant (ref lab) (Completed)   Progesterone (Completed)   Pregnancy with inconclusive fetal viability, single or unspecified fetus       Relevant Orders   Beta hCG quant (ref lab) (Completed)   Progesterone (Completed)      1) Pregnancy of unknown location/viability  - trend HCG if appropriate rise will obtain TVUS for dating in 2 weeks - progesterone level   2) If no already done will proceed with TVUS evaluation to document viability, and if uncertain viability or absence of a demonstrable IUP (and no previous documentation of IUP) will trend HCG levels.  3) The patient is O POS  rhogam is therefore not indicated to decrease the risk rhesus alloimmunization.    4) Routine bleeding precautions were discussed with the patient prior the conclusion of today's visit.    22, MD, [facility], [facility]3/21/2022, 1:20 PM

## 2021-02-18 LAB — PROGESTERONE: Progesterone: 16.4 ng/mL

## 2021-02-18 LAB — BETA HCG QUANT (REF LAB): hCG Quant: 397 m[IU]/mL

## 2021-02-18 NOTE — Telephone Encounter (Signed)
Needs ultraound next week can be with any MD who can do a transvaginal ultrasound

## 2021-02-18 NOTE — Telephone Encounter (Signed)
Patient is scheduled for 02/24/21 with AMS

## 2021-02-20 ENCOUNTER — Encounter: Payer: Medicaid Other | Admitting: Obstetrics and Gynecology

## 2021-02-24 ENCOUNTER — Other Ambulatory Visit: Payer: Self-pay

## 2021-02-24 ENCOUNTER — Ambulatory Visit (INDEPENDENT_AMBULATORY_CARE_PROVIDER_SITE_OTHER): Payer: BLUE CROSS/BLUE SHIELD | Admitting: Obstetrics and Gynecology

## 2021-02-24 VITALS — BP 122/74 | Ht 62.0 in | Wt 226.0 lb

## 2021-02-24 DIAGNOSIS — Z3480 Encounter for supervision of other normal pregnancy, unspecified trimester: Secondary | ICD-10-CM | POA: Diagnosis not present

## 2021-02-24 DIAGNOSIS — Z3A01 Less than 8 weeks gestation of pregnancy: Secondary | ICD-10-CM

## 2021-02-25 ENCOUNTER — Encounter: Payer: Self-pay | Admitting: Obstetrics and Gynecology

## 2021-02-25 DIAGNOSIS — Z3480 Encounter for supervision of other normal pregnancy, unspecified trimester: Secondary | ICD-10-CM | POA: Insufficient documentation

## 2021-02-25 NOTE — Progress Notes (Addendum)
ULTRASOUND REPORT  Location: [facility] Date of Service: 02/24/2021   Indications:dating Findings:  Mason Jim intrauterine pregnancy is visualized with a MSD 8.8 mm consistent with [redacted]w[redacted]d gestation, giving an (U/S) EDD of 10/24/2021. The (U/S) EDD is consistent with the clinically established EDD of 10/22/2021.  FHR: not visualized CRL measurement: embryo not visualized Yolk sac is visualized and appears normal. Amnion: not visualized  Gestational sac appears: fundal  Right Ovary is normal in appearance. Left Ovary is normal appearance. Corpus luteal cyst:  Left ovary Survey of the adnexa demonstrates no adnexal masses. There is no free peritoneal fluid in the cul de sac.  Impression: 1. [redacted]w[redacted]d Viable Singleton Intrauterine pregnancy by U/S. 2. (U/S) EDD is consistent with Clinically established EDD of 10/22/2021.  Thomasene Mohair, MD   There is a singleton gestation.  Detailed evaluation of the fetal anatomy is precluded by early gestational age, especially given gestational age is too early to visualize embryo.  It must be noted that a normal ultrasound particular at this early gestational age is unable to rule out fetal aneuploidy, risk of first trimester miscarriage, or anatomic birth defects.  Thomasene Mohair, MD, [facility], [facility] 02/24/2021 5:06 PM

## 2021-02-25 NOTE — Progress Notes (Signed)
Obstetrics & Gynecology Office Visit   Chief Complaint  Patient presents with  . Routine Prenatal Visit    History of Present Illness: 25 y.o. G12P1001 female at [redacted]w[redacted]d by LMP presents for routine prenatal and pelvic ultrasound.  She denies issues with vaginal bleeding and abdominal cramping. She has no other complaints. Ultrasound performed today shows a gestational sac with a yolk sac. No fetal pole visualized. The mean sac diameter is consistent with gestational age.  See report listed under NOTES section.    Past Medical History:  Diagnosis Date  . Medical history non-contributory   . Obesity (BMI 35.0-39.9 without comorbidity)     Past Surgical History:  Procedure Laterality Date  . NO PAST SURGERIES      Gynecologic History: Patient's last menstrual period was 01/15/2021.  Obstetric History: G2P1001  Family History  Problem Relation Age of Onset  . Hypertension Maternal Grandfather   . Diabetes Maternal Grandfather   . Heart disease Maternal Grandfather   . Cancer Maternal Grandmother   . Ovarian cysts Mother   . Heart disease Paternal Grandmother   . High Cholesterol Paternal Grandmother     Social History   Socioeconomic History  . Marital status: Married    Spouse name: Not on file  . Number of children: Not on file  . Years of education: Not on file  . Highest education level: Not on file  Occupational History  . Not on file  Tobacco Use  . Smoking status: Never Smoker  . Smokeless tobacco: Never Used  Vaping Use  . Vaping Use: Never used  Substance and Sexual Activity  . Alcohol use: No    Alcohol/week: 0.0 standard drinks  . Drug use: No  . Sexual activity: Yes  Other Topics Concern  . Not on file  Social History Narrative  . Not on file   Social Determinants of Health   Financial Resource Strain: Not on file  Food Insecurity: Not on file  Transportation Needs: Not on file  Physical Activity: Not on file  Stress: Not on file  Social  Connections: Not on file  Intimate Partner Violence: Not on file    No Known Allergies  Prior to Admission medications: PNVs    Review of Systems  Constitutional: Negative.   HENT: Negative.   Eyes: Negative.   Respiratory: Negative.   Cardiovascular: Negative.   Gastrointestinal: Negative.   Genitourinary: Negative.   Musculoskeletal: Negative.   Skin: Negative.   Neurological: Negative.   Psychiatric/Behavioral: Negative.      Physical Exam BP 122/74   Ht 5\' 2"  (1.575 m)   Wt 226 lb (102.5 kg)   LMP 01/15/2021   BMI 41.34 kg/m  Patient's last menstrual period was 01/15/2021. Physical Exam Constitutional:      General: She is not in acute distress.    Appearance: Normal appearance.  HENT:     Head: Normocephalic and atraumatic.  Eyes:     General: No scleral icterus.    Conjunctiva/sclera: Conjunctivae normal.  Neurological:     General: No focal deficit present.     Mental Status: She is alert and oriented to person, place, and time.     Cranial Nerves: No cranial nerve deficit.  Psychiatric:        Mood and Affect: Mood normal.        Behavior: Behavior normal.        Judgment: Judgment normal.     Female chaperone present for pelvic and breast  portions  of the physical exam  Assessment: 25 y.o. G15P1001 female here for  1. [redacted] weeks gestation of pregnancy   2. Supervision of other normal pregnancy, antepartum      Plan: Problem List Items Addressed This Visit      Other   Supervision of other normal pregnancy, antepartum    Other Visit Diagnoses    [redacted] weeks gestation of pregnancy    -  Primary     Follow up in 11 days for repeat ultrasound. Precautions given.   Thomasene Mohair, MD 02/25/2021 5:09 PM

## 2021-03-06 ENCOUNTER — Encounter: Payer: Medicaid Other | Admitting: Obstetrics and Gynecology

## 2021-03-06 ENCOUNTER — Other Ambulatory Visit: Payer: Self-pay | Admitting: Obstetrics and Gynecology

## 2021-03-06 MED ORDER — ONDANSETRON 4 MG PO TBDP: 4.0000 mg | ORAL_TABLET | Freq: Four times a day (QID) | ORAL | 0 refills | Status: DC | PRN

## 2021-03-07 ENCOUNTER — Other Ambulatory Visit: Payer: Self-pay

## 2021-03-07 ENCOUNTER — Ambulatory Visit (INDEPENDENT_AMBULATORY_CARE_PROVIDER_SITE_OTHER): Payer: BLUE CROSS/BLUE SHIELD | Admitting: Obstetrics and Gynecology

## 2021-03-07 ENCOUNTER — Encounter: Payer: Self-pay | Admitting: Obstetrics and Gynecology

## 2021-03-07 VITALS — BP 122/74 | Wt 223.0 lb

## 2021-03-07 DIAGNOSIS — Z3480 Encounter for supervision of other normal pregnancy, unspecified trimester: Secondary | ICD-10-CM | POA: Diagnosis not present

## 2021-03-07 DIAGNOSIS — Z3A01 Less than 8 weeks gestation of pregnancy: Secondary | ICD-10-CM

## 2021-03-12 NOTE — Progress Notes (Signed)
ULTRASOUND REPORT  Location: [facility] Date of Service: 03/07/2021   Indications:dating Findings:  Mason Jim intrauterine pregnancy is visualized with a CRL consistent with [redacted]w[redacted]d gestation, giving an (U/S) EDD of 10/25/2021. The (U/S) EDD is consistent with the clinically established EDD of 10/22/2021.  FHR: 144 BPM CRL measurement: 9.2 mm Yolk sac is visualized and appears normal. Amnion: not visualized   Right Ovary is normal in appearance. Left Ovary is normal appearance. Corpus luteal cyst:  Left ovary Survey of the adnexa demonstrates no adnexal masses. There is no free peritoneal fluid in the cul de sac.  Impression: 1. [redacted]w[redacted]d Viable Singleton Intrauterine pregnancy by U/S. 2. (U/S) EDD is consistent with Clinically established EDD of 10/22/2021.  Thomasene Mohair, MD   There is a viable singleton gestation.  Detailed evaluation of the fetal anatomy is precluded by early gestational age.  It must be noted that a normal ultrasound particular at this early gestational age is unable to rule out fetal aneuploidy, risk of first trimester miscarriage, or anatomic birth defects. I personally performed and interpreted the ultrasound. A female chaperone was present, as this was a transvaginal ultrasound.    Thomasene Mohair, MD, [facility], [facility] 03/12/2021 5:38 PM

## 2021-03-13 ENCOUNTER — Telehealth: Payer: Self-pay

## 2021-03-13 NOTE — Telephone Encounter (Signed)
Pt calling; for the past few days has felt like she is dehydrated; is definitely drinking her water; woke up this am cramping; should she be seen or is it fine?  [phone]  Pt states she is drinking 64oz of water a day; adv can increase by a quart to see if that makes her feel better; can add sugar free flavoring to water or lemon juice, hold ice in her mouth, take sips instead of gulps.  States she switched from coffee to water when she found out she was preg which is a very hard switch to make.  Adv pt cramping is normal through out the whole pregnancy as long as it doesn't double her over or stop her in her tracks.  Pt states she saw some mucus yesterday.  Adv normal; adv discharge may increase in amount as the pregnancy progresses and that is normal as long as it's not itching, burning, irritating, is bloody or pink.  Pt voiced understanding.

## 2021-03-24 ENCOUNTER — Encounter: Payer: Medicaid Other | Admitting: Obstetrics and Gynecology

## 2021-03-31 ENCOUNTER — Ambulatory Visit (INDEPENDENT_AMBULATORY_CARE_PROVIDER_SITE_OTHER): Payer: Medicaid Other | Admitting: Obstetrics and Gynecology

## 2021-03-31 ENCOUNTER — Encounter: Payer: Self-pay | Admitting: Obstetrics and Gynecology

## 2021-03-31 ENCOUNTER — Other Ambulatory Visit: Payer: Self-pay

## 2021-03-31 VITALS — BP 122/74 | Wt 219.0 lb

## 2021-03-31 DIAGNOSIS — Z369 Encounter for antenatal screening, unspecified: Secondary | ICD-10-CM

## 2021-03-31 DIAGNOSIS — O99210 Obesity complicating pregnancy, unspecified trimester: Secondary | ICD-10-CM | POA: Insufficient documentation

## 2021-03-31 DIAGNOSIS — Z3A10 10 weeks gestation of pregnancy: Secondary | ICD-10-CM

## 2021-03-31 DIAGNOSIS — Z8759 Personal history of other complications of pregnancy, childbirth and the puerperium: Secondary | ICD-10-CM

## 2021-03-31 DIAGNOSIS — Z3481 Encounter for supervision of other normal pregnancy, first trimester: Secondary | ICD-10-CM

## 2021-03-31 DIAGNOSIS — O99211 Obesity complicating pregnancy, first trimester: Secondary | ICD-10-CM

## 2021-03-31 DIAGNOSIS — Z8632 Personal history of gestational diabetes: Secondary | ICD-10-CM

## 2021-03-31 NOTE — Progress Notes (Signed)
New Obstetric Patient H&P   Chief Complaint: "Desires prenatal care"  History of Present Illness: Patient is a 25 y.o. G2P1001 Not Hispanic or Latino female, LMP 01/15/2021 presents with amenorrhea and positive home pregnancy test. Based on her  LMP and a [redacted]w[redacted]d u/s, her EDD is Estimated Date of Delivery: 10/22/21 and her EGA is [redacted]w[redacted]d. Her last pap smear was 3 months ago and was no abnormalities.    Since her LMP she claims she has experienced some nausea with occasional emesis. She denies vaginal bleeding. Her past medical history is normal. Her prior pregnancies are notable for gestational diabetes (diet controlled).  The baby weighed 6 lb 6 oz.  She delivered at 38.5 weeks.  There was concern for [facility] with this pregnancy too.   Since her LMP, she admits to the use of tobacco products  no She claims she has gained zero pounds since the start of her pregnancy.  There are cats in the home in the home  no  She admits close contact with children on a regular basis  yes  She has had chicken pox in the past yes She has had Tuberculosis exposures, symptoms, or previously tested positive for TB   no Current or past history of domestic violence. no  Genetic Screening/Teratology Counseling: (Includes patient, baby's father, or anyone in either family with:)   1. Patient's age >/= 26 at [facility]  no 2. Thalassemia ([place], [place], Mediterranean, or Asian background): MCV<80  no 3. Neural tube defect (meningomyelocele, spina bifida, anencephaly)  no 4. Congenital heart defect  no  5. Down syndrome  no 6. Tay-Sachs (Jewish, [place]  no 7. Canavan's Disease  no 8. Sickle cell disease or trait (African)  no  9. Hemophilia or other blood disorders  no  10. Muscular dystrophy  no  11. Cystic fibrosis  no  12. Huntington's Chorea  no  13. Mental retardation/autism  no 14. Other inherited genetic or chromosomal disorder  no 15. Maternal metabolic disorder (DM, PKU, etc)  no 16. Patient or FOB  with a child with a birth defect not listed above no  16a. Patient or FOB with a birth defect themselves no 17. Recurrent pregnancy loss, or stillbirth  no  18. Any medications since LMP other than prenatal vitamins (include vitamins, supplements, OTC meds, drugs, alcohol)  no 19. Any other genetic/environmental exposure to discuss  no  Infection History:   1. Lives with someone with TB or TB exposed  no  2. Patient or partner has history of genital herpes  no 3. Rash or viral illness since LMP  no 4. History of STI (GC, CT, HPV, syphilis, HIV)  no 5. History of recent travel :  no  Other pertinent information:  no     Review of Systems:10 point review of systems negative unless otherwise noted in HPI  Past Medical History:  Diagnosis Date  . Medical history non-contributory   . Obesity (BMI 35.0-39.9 without comorbidity)     Past Surgical History:  Procedure Laterality Date  . NO PAST SURGERIES      Gynecologic History: Patient's last menstrual period was 01/15/2021.  Obstetric History: G2P1001, SVD x 1  Family History  Problem Relation Age of Onset  . Hypertension Maternal Grandfather   . Diabetes Maternal Grandfather   . Heart disease Maternal Grandfather   . Cancer Maternal Grandmother   . Ovarian cysts Mother   . Heart disease Paternal Grandmother   . High Cholesterol Paternal Grandmother  Social History   Socioeconomic History  . Marital status: Married    Spouse name: Not on file  . Number of children: Not on file  . Years of education: Not on file  . Highest education level: Not on file  Occupational History  . Not on file  Tobacco Use  . Smoking status: Never Smoker  . Smokeless tobacco: Never Used  Vaping Use  . Vaping Use: Never used  Substance and Sexual Activity  . Alcohol use: No    Alcohol/week: 0.0 standard drinks  . Drug use: No  . Sexual activity: Yes  Other Topics Concern  . Not on file  Social History Narrative  . Not on file    Social Determinants of Health   Financial Resource Strain: Not on file  Food Insecurity: Not on file  Transportation Needs: Not on file  Physical Activity: Not on file  Stress: Not on file  Social Connections: Not on file  Intimate Partner Violence: Not on file    No Known Allergies  Prior to Admission medications   Medication Sig Start Date End Date Taking? Authorizing Provider  ondansetron (ZOFRAN ODT) 4 MG disintegrating tablet Take 1 tablet (4 mg total) by mouth every 6 (six) hours as needed for nausea. 03/06/21   Vena [place], MD    Physical Exam BP 122/74   Wt 219 lb (99.3 kg)   LMP 01/15/2021   BMI 40.06 kg/m   Physical Exam Constitutional:      General: She is not in acute distress.    Appearance: Normal appearance. She is well-developed.  HENT:     Head: Normocephalic and atraumatic.  Eyes:     General: No scleral icterus.    Conjunctiva/sclera: Conjunctivae normal.  Cardiovascular:     Rate and Rhythm: Normal rate and regular rhythm.     Heart sounds: No murmur heard. No friction rub. No gallop.   Pulmonary:     Effort: Pulmonary effort is normal. No respiratory distress.     Breath sounds: Normal breath sounds. No wheezing or rales.  Abdominal:     General: Bowel sounds are normal. There is no distension.     Palpations: Abdomen is soft. There is no mass.     Tenderness: There is no abdominal tenderness. There is no guarding or rebound.  Musculoskeletal:        General: Normal range of motion.     Cervical back: Normal range of motion and neck supple.  Neurological:     General: No focal deficit present.     Mental Status: She is alert and oriented to person, place, and time.     Cranial Nerves: No cranial nerve deficit.  Skin:    General: Skin is warm and dry.     Findings: No erythema.  Psychiatric:        Mood and Affect: Mood normal.        Behavior: Behavior normal.        Judgment: Judgment normal.   Female Chaperone present during  breast and/or pelvic exam.   Assessment: 25 y.o. G2P1001 at [redacted]w[redacted]d presenting to initiate prenatal care  Plan: 1) Avoid alcoholic beverages. 2) Patient encouraged not to smoke.  3) Discontinue the use of all non-medicinal drugs and chemicals.  4) Take prenatal vitamins daily.  5) Nutrition, food safety (fish, cheese advisories, and high nitrite foods) and exercise discussed. 6) Hospital and practice style discussed with cross coverage system.  7) Genetic Screening, such as with 1st Trimester  Screening, cell free fetal DNA, AFP testing, and Ultrasound, as well as with amniocentesis and CVS as appropriate, is discussed with patient. At the conclusion of today's visit patient requested genetic testing 8) Patient is asked about travel to areas at risk for the Zika virus, and counseled to avoid travel and exposure to mosquitoes or sexual partners who may have themselves been exposed to the virus. Testing is discussed, and will be ordered as appropriate.  9) History of GDMA1: early 1h gtt 10) h/o gHTN: bASA after 12 weeks.  Thomasene Mohair, MD 03/31/2021 11:06 AM

## 2021-03-31 NOTE — Patient Instructions (Signed)
Obstetrics: Normal and Problem Pregnancies (7th ed., pp. 102-121). [place], [place]: Elsevier."> Textbook of Family Medicine (9th ed., pp. 365-410). [place], [place]: [employer].">  First Trimester of Pregnancy  The first trimester of pregnancy starts on the first day of your last menstrual period until the end of week 12. This is months 1 through 3 of pregnancy. A week after a sperm fertilizes an egg, the egg will implant into the wall of the uterus and begin to develop into a baby. By the end of 12 weeks, all the baby's organs will be formed and the baby will be 2-3 inches in size. Body changes during your first trimester Your body goes through many changes during pregnancy. The changes vary and generally return to normal after your baby is born. Physical changes  You may gain or lose weight.  Your breasts may begin to grow larger and become tender. The tissue that surrounds your nipples (areola) may become darker.  Dark spots or blotches (chloasma or mask of pregnancy) may develop on your face.  You may have changes in your hair. These can include thickening or thinning of your hair or changes in texture. Health changes  You may feel nauseous, and you may vomit.  You may have heartburn.  You may develop headaches.  You may develop constipation.  Your gums may bleed and may be sensitive to brushing and flossing. Other changes  You may tire easily.  You may urinate more often.  Your menstrual periods will stop.  You may have a loss of appetite.  You may develop cravings for certain kinds of food.  You may have changes in your emotions from day to day.  You may have more vivid and strange dreams. Follow these instructions at home: Medicines  Follow your health care provider's instructions regarding medicine use. Specific medicines may be either safe or unsafe to take during pregnancy. Do not take any medicines unless told to by your health care provider.  Take a  prenatal vitamin that contains at least 600 micrograms (mcg) of folic acid. Eating and drinking  Eat a healthy diet that includes fresh fruits and vegetables, whole grains, good sources of protein such as meat, eggs, or tofu, and low-fat dairy products.  Avoid raw meat and unpasteurized juice, milk, and cheese. These carry germs that can harm you and your baby.  If you feel nauseous or you vomit: ? Eat 4 or 5 small meals a day instead of 3 large meals. ? Try eating a few soda crackers. ? Drink liquids between meals instead of during meals.  You may need to take these actions to prevent or treat constipation: ? Drink enough fluid to keep your urine pale yellow. ? Eat foods that are high in fiber, such as beans, whole grains, and fresh fruits and vegetables. ? Limit foods that are high in fat and processed sugars, such as fried or sweet foods. Activity  Exercise only as directed by your health care provider. Most people can continue their usual exercise routine during pregnancy. Try to exercise for 30 minutes at least 5 days a week.  Stop exercising if you develop pain or cramping in the lower abdomen or lower back.  Avoid exercising if it is very hot or humid or if you are at high altitude.  Avoid heavy lifting.  If you choose to, you may have sex unless your health care provider tells you not to. Relieving pain and discomfort  Wear a good support bra to relieve breast   tenderness.  Rest with your legs elevated if you have leg cramps or low back pain.  If you develop bulging veins (varicose veins) in your legs: ? Wear support hose as told by your health care provider. ? Elevate your feet for 15 minutes, 3-4 times a day. ? Limit salt in your diet. Safety  Wear your seat belt at all times when driving or riding in a car.  Talk with your health care provider if someone is verbally or physically abusive to you.  Talk with your health care provider if you are feeling sad or have  thoughts of hurting yourself. Lifestyle  Do not use hot tubs, steam rooms, or saunas.  Do not douche. Do not use tampons or scented sanitary pads.  Do not use herbal remedies, alcohol, illegal drugs, or medicines that are not approved by your health care provider. Chemicals in these products can harm your baby.  Do not use any products that contain nicotine or tobacco, such as cigarettes, e-cigarettes, and chewing tobacco. If you need help quitting, ask your health care provider.  Avoid cat litter boxes and soil used by cats. These carry germs that can cause birth defects in the baby and possibly loss of the unborn baby (fetus) by miscarriage or stillbirth. General instructions  During routine prenatal visits in the first trimester, your health care provider will do a physical exam, perform necessary tests, and ask you how things are going. Keep all follow-up visits. This is important.  Ask for help if you have counseling or nutritional needs during pregnancy. Your health care provider can offer advice or refer you to specialists for help with various needs.  Schedule a dentist appointment. At home, brush your teeth with a soft toothbrush. Floss gently.  Write down your questions. Take them to your prenatal visits. Where to find more information  American Pregnancy Association: americanpregnancy.org  American College of Obstetricians and Gynecologists: acog.org/en/Womens%20Health/Pregnancy  Office on Women's Health: womenshealth.gov/pregnancy Contact a health care provider if you have:  Dizziness.  A fever.  Mild pelvic cramps, pelvic pressure, or nagging pain in the abdominal area.  Nausea, vomiting, or diarrhea that lasts for 24 hours or longer.  A bad-smelling vaginal discharge.  Pain when you urinate.  Known exposure to a contagious illness, such as chickenpox, measles, Zika virus, HIV, or hepatitis. Get help right away if you have:  Spotting or bleeding from your  vagina.  Severe abdominal cramping or pain.  Shortness of breath or chest pain.  Any kind of trauma, such as from a fall or a car crash.  New or increased pain, swelling, or redness in an arm or leg. Summary  The first trimester of pregnancy starts on the first day of your last menstrual period until the end of week 12 (months 1 through 3).  Eating 4 or 5 small meals a day rather than 3 large meals may help to relieve nausea and vomiting.  Do not use any products that contain nicotine or tobacco, such as cigarettes, e-cigarettes, and chewing tobacco. If you need help quitting, ask your health care provider.  Keep all follow-up visits. This is important. This information is not intended to replace advice given to you by your health care provider. Make sure you discuss any questions you have with your health care provider. Document Revised: 04/24/2020 Document Reviewed: 02/29/2020 Elsevier Patient Education  2021 Elsevier Inc.  

## 2021-05-05 ENCOUNTER — Encounter: Payer: Self-pay | Admitting: Obstetrics and Gynecology

## 2021-05-05 ENCOUNTER — Ambulatory Visit (INDEPENDENT_AMBULATORY_CARE_PROVIDER_SITE_OTHER): Payer: Medicaid Other | Admitting: Obstetrics and Gynecology

## 2021-05-05 ENCOUNTER — Other Ambulatory Visit: Payer: Self-pay

## 2021-05-05 ENCOUNTER — Other Ambulatory Visit: Payer: Medicaid Other

## 2021-05-05 VITALS — BP 120/80 | Wt 216.0 lb

## 2021-05-05 DIAGNOSIS — Z8632 Personal history of gestational diabetes: Secondary | ICD-10-CM

## 2021-05-05 DIAGNOSIS — Z3482 Encounter for supervision of other normal pregnancy, second trimester: Secondary | ICD-10-CM

## 2021-05-05 DIAGNOSIS — Z3481 Encounter for supervision of other normal pregnancy, first trimester: Secondary | ICD-10-CM

## 2021-05-05 DIAGNOSIS — Z1379 Encounter for other screening for genetic and chromosomal anomalies: Secondary | ICD-10-CM

## 2021-05-05 DIAGNOSIS — Z3A15 15 weeks gestation of pregnancy: Secondary | ICD-10-CM

## 2021-05-05 DIAGNOSIS — O99212 Obesity complicating pregnancy, second trimester: Secondary | ICD-10-CM

## 2021-05-05 DIAGNOSIS — Z8759 Personal history of other complications of pregnancy, childbirth and the puerperium: Secondary | ICD-10-CM

## 2021-05-05 LAB — POCT URINALYSIS DIPSTICK OB: Glucose, UA: NEGATIVE

## 2021-05-05 LAB — OB RESULTS CONSOLE VARICELLA ZOSTER ANTIBODY, IGG: Varicella: NON-IMMUNE/NOT IMMUNE

## 2021-05-05 NOTE — Addendum Note (Signed)
Addended by: Donnetta Hail on: 05/05/2021 10:07 AM   Modules accepted: Orders

## 2021-05-05 NOTE — Progress Notes (Signed)
Routine Prenatal Care Visit  Subjective  Gina Gomez is a 25 y.o. G2P1001 at [redacted]w[redacted]d being seen today for ongoing prenatal care.  She is currently monitored for the following issues for this low-risk pregnancy and has History of gestational diabetes; Obesity BMI=39.7; Supervision of other normal pregnancy, antepartum; Obesity affecting pregnancy; and History of gestational hypertension on their problem list.  ----------------------------------------------------------------------------------- Patient reports no complaints.  She does still have some nausea, vomits only occasionally and after eating meat.  . Vag. Bleeding: None.  Movement: Absent. Leaking Fluid denies.  ----------------------------------------------------------------------------------- The following portions of the patient's history were reviewed and updated as appropriate: allergies, current medications, past family history, past medical history, past social history, past surgical history and problem list. Problem list updated.  Objective  Blood pressure 120/80, weight 216 lb (98 kg), last menstrual period 01/15/2021. Pregravid weight 219 lb (99.3 kg) Total Weight Gain -3 lb (-1.361 kg) Urinalysis: Urine Protein    Urine Glucose    Fetal Status: Fetal Heart Rate (bpm): 155   Movement: Absent     General:  Alert, oriented and cooperative. Patient is in no acute distress.  Skin: Skin is warm and dry. No rash noted.   Cardiovascular: Normal heart rate noted  Respiratory: Normal respiratory effort, no problems with respiration noted  Abdomen: Soft, gravid, appropriate for gestational age. Pain/Pressure: Absent     Pelvic:  Cervical exam deferred        Extremities: Normal range of motion.     Mental Status: Normal mood and affect. Normal behavior. Normal judgment and thought content.   Assessment   25 y.o. G2P1001 at [redacted]w[redacted]d by  10/22/2021, by Last Menstrual Period presenting for routine prenatal visit  Plan   pregnancy  Problems (from 03/31/21 to present)    Problem Noted Resolved   Obesity affecting pregnancy 03/31/2021 by Conard Novak, MD No   History of gestational hypertension 03/31/2021 by Conard Novak, MD No   Overview Signed 03/31/2021 11:28 AM by Conard Novak, MD    [x]  bASA p 12 weeks      Supervision of other normal pregnancy, antepartum 02/25/2021 by 02/27/2021, MD No   Overview Signed 03/31/2021 11:02 AM by 05/31/2021, MD    [facility] Prenatal Labs  Dating L=6 Blood type:     Genetic Screen 1 Screen:    AFP:     Quad:     NIPS: Antibody:   Anatomic Conard Novak  Rubella:   Varicella: @VZVIGG @  GTT Early:               Third trimester:  RPR:     Rhogam  HBsAg:     TDaP vaccine                       Flu Shot: HIV:     Baby Food                                GBS:   Contraception  Pap:  CBB     CS/VBAC    Support Person            History of gestational diabetes 12/20/2015 by , RN No   Overview Signed 03/31/2021 11:05 AM by William Hamburger, MD    [ ]  early 1h gtt          Preterm labor symptoms and general  obstetric precautions including but not limited to vaginal bleeding, contractions, leaking of fluid and fetal movement were reviewed in detail with the patient. Please refer to After Visit Summary for other counseling recommendations.   - NOB labs today - 1h gtt today, she did have 3 small doughnuts this morning - NIPT today - schedule anatomy u/s  Return in about 4 weeks (around 06/02/2021) for Routine Prenatal Appointment.   Thomasene Mohair, MD, [facility], [facility] 05/05/2021 9:58 AM

## 2021-05-06 ENCOUNTER — Other Ambulatory Visit: Payer: Self-pay | Admitting: Obstetrics and Gynecology

## 2021-05-06 DIAGNOSIS — O99810 Abnormal glucose complicating pregnancy: Secondary | ICD-10-CM

## 2021-05-06 LAB — RPR+RH+ABO+RUB AB+AB SCR+CB...
Antibody Screen: NEGATIVE
HIV Screen 4th Generation wRfx: NONREACTIVE
Hematocrit: 36.2 % (ref 34.0–46.6)
Hemoglobin: 11.8 g/dL (ref 11.1–15.9)
Hepatitis B Surface Ag: NEGATIVE
MCH: 28.2 pg (ref 26.6–33.0)
MCHC: 32.6 g/dL (ref 31.5–35.7)
MCV: 86 fL (ref 79–97)
Platelets: 223 10*3/uL (ref 150–450)
RBC: 4.19 x10E6/uL (ref 3.77–5.28)
RDW: 13.1 % (ref 11.7–15.4)
RPR Ser Ql: NONREACTIVE
Rh Factor: POSITIVE
Rubella Antibodies, IGG: 1 index (ref >0.99)
Varicella zoster IgG: <135 index — ABNORMAL LOW (ref >165)
WBC: 9.8 10*3/uL (ref 3.4–10.8)

## 2021-05-06 LAB — GLUCOSE, 1 HOUR GESTATIONAL: Gestational Diabetes Screen: 167 mg/dL — ABNORMAL HIGH (ref 65–139)

## 2021-05-09 ENCOUNTER — Other Ambulatory Visit: Payer: Self-pay

## 2021-05-13 ENCOUNTER — Other Ambulatory Visit: Payer: Medicaid Other

## 2021-05-13 ENCOUNTER — Other Ambulatory Visit: Payer: Self-pay

## 2021-05-13 DIAGNOSIS — O99810 Abnormal glucose complicating pregnancy: Secondary | ICD-10-CM

## 2021-05-14 LAB — GESTATIONAL GLUCOSE TOLERANCE
Glucose, Fasting: 88 mg/dL (ref 65–94)
Glucose, GTT - 1 Hour: 162 mg/dL (ref 65–179)
Glucose, GTT - 2 Hour: 123 mg/dL (ref 65–154)
Glucose, GTT - 3 Hour: 102 mg/dL (ref 65–139)

## 2021-05-15 ENCOUNTER — Telehealth: Payer: Self-pay

## 2021-05-15 NOTE — Telephone Encounter (Signed)
Pt calling; wants new order for genetic testing; it has been ordered before but not done; per [facility] it wasn't submitted.  [phone]  Called [place] to see if a new ordered is needed; she adv the existing one could just be updated.  Pt aware and wants to go ahead and get this done before her next appt 06/05/21; tx'd to [facility] for scheduling with the lab.

## 2021-05-16 ENCOUNTER — Other Ambulatory Visit: Payer: Self-pay | Admitting: Obstetrics and Gynecology

## 2021-05-16 ENCOUNTER — Other Ambulatory Visit: Payer: Self-pay

## 2021-05-16 ENCOUNTER — Other Ambulatory Visit: Payer: Medicaid Other

## 2021-05-16 DIAGNOSIS — Z3482 Encounter for supervision of other normal pregnancy, second trimester: Secondary | ICD-10-CM

## 2021-05-16 DIAGNOSIS — Z1379 Encounter for other screening for genetic and chromosomal anomalies: Secondary | ICD-10-CM

## 2021-05-16 DIAGNOSIS — Z3A17 17 weeks gestation of pregnancy: Secondary | ICD-10-CM

## 2021-05-24 LAB — MATERNIT 21 PLUS CORE, BLOOD
Fetal Fraction: 7
Result (T21): NEGATIVE
Trisomy 13 (Patau syndrome): NEGATIVE
Trisomy 18 (Edwards syndrome): NEGATIVE
Trisomy 21 (Down syndrome): NEGATIVE

## 2021-06-04 ENCOUNTER — Encounter: Payer: Medicaid Other | Admitting: Obstetrics and Gynecology

## 2021-06-06 ENCOUNTER — Ambulatory Visit (INDEPENDENT_AMBULATORY_CARE_PROVIDER_SITE_OTHER): Payer: Medicaid Other | Admitting: Obstetrics and Gynecology

## 2021-06-06 ENCOUNTER — Other Ambulatory Visit: Payer: Self-pay

## 2021-06-06 ENCOUNTER — Encounter: Payer: Self-pay | Admitting: Obstetrics and Gynecology

## 2021-06-06 VITALS — BP 120/78 | Wt 222.0 lb

## 2021-06-06 DIAGNOSIS — Z3482 Encounter for supervision of other normal pregnancy, second trimester: Secondary | ICD-10-CM

## 2021-06-06 DIAGNOSIS — Z3A20 20 weeks gestation of pregnancy: Secondary | ICD-10-CM

## 2021-06-06 DIAGNOSIS — Z8759 Personal history of other complications of pregnancy, childbirth and the puerperium: Secondary | ICD-10-CM

## 2021-06-06 DIAGNOSIS — O99212 Obesity complicating pregnancy, second trimester: Secondary | ICD-10-CM

## 2021-06-06 DIAGNOSIS — Z8632 Personal history of gestational diabetes: Secondary | ICD-10-CM

## 2021-06-06 NOTE — Progress Notes (Signed)
Routine Prenatal Care Visit  Subjective  Gina Gomez is a 25 y.o. G2P1001 at [redacted]w[redacted]d being seen today for ongoing prenatal care.  She is currently monitored for the following issues for this low-risk pregnancy and has History of gestational diabetes; Obesity BMI=39.7; Supervision of other normal pregnancy, antepartum; Obesity affecting pregnancy; and History of gestational hypertension on their problem list.  ----------------------------------------------------------------------------------- Patient reports no complaints.    . Vag. Bleeding: None.  Movement: Present. Leaking Fluid denies.  ----------------------------------------------------------------------------------- The following portions of the patient's history were reviewed and updated as appropriate: allergies, current medications, past family history, past medical history, past social history, past surgical history and problem list. Problem list updated.  Objective  Blood pressure 120/78, weight 222 lb (100.7 kg), last menstrual period 01/15/2021. Pregravid weight 219 lb (99.3 kg) Total Weight Gain 3 lb (1.361 kg) Urinalysis: Urine Protein    Urine Glucose    Fetal Status: Fetal Heart Rate (bpm): 145   Movement: Present     General:  Alert, oriented and cooperative. Patient is in no acute distress.  Skin: Skin is warm and dry. No rash noted.   Cardiovascular: Normal heart rate noted  Respiratory: Normal respiratory effort, no problems with respiration noted  Abdomen: Soft, gravid, appropriate for gestational age. Pain/Pressure: Absent     Pelvic:  Cervical exam deferred        Extremities: Normal range of motion.     Mental Status: Normal mood and affect. Normal behavior. Normal judgment and thought content.   Assessment   25 y.o. G2P1001 at [redacted]w[redacted]d by  10/22/2021, by Last Menstrual Period presenting for routine prenatal visit  Plan   pregnancy Problems (from 03/31/21 to present)     Problem Noted Resolved   Obesity  affecting pregnancy 03/31/2021 by Conard Novak, MD No   History of gestational hypertension 03/31/2021 by Conard Novak, MD No   Overview Signed 03/31/2021 11:28 AM by Conard Novak, MD    [x]  bASA p 12 weeks       Supervision of other normal pregnancy, antepartum 02/25/2021 by 02/27/2021, MD No   Overview Signed 03/31/2021 11:02 AM by 05/31/2021, MD    [facility] Prenatal Labs  Dating L=6 Blood type:     Genetic Screen 1 Screen:    AFP:     Quad:     NIPS: Antibody:   Anatomic Conard Novak  Rubella:   Varicella: @VZVIGG @  GTT Early:               Third trimester:  RPR:     Rhogam  HBsAg:     TDaP vaccine                       Flu Shot: HIV:     Baby Food                                GBS:   Contraception  Pap:  CBB     CS/VBAC    Support Person             History of gestational diabetes 12/20/2015 by , RN No   Overview Signed 03/31/2021 11:05 AM by William Hamburger, MD    [ ]  early 1h gtt            Preterm labor symptoms and general obstetric precautions including but not limited to  vaginal bleeding, contractions, leaking of fluid and fetal movement were reviewed in detail with the patient. Please refer to After Visit Summary for other counseling recommendations.   Return in about 10 days (around 06/16/2021) for Routine Prenatal Appointment, anatomy u/s follow up.   Thomasene Mohair, MD, [facility], [facility] 06/06/2021 2:28 PM

## 2021-06-13 ENCOUNTER — Ambulatory Visit
Admission: RE | Admit: 2021-06-13 | Discharge: 2021-06-13 | Disposition: A | Discharge status: Home / Self Care | Payer: BLUE CROSS/BLUE SHIELD | Source: Ambulatory Visit | Attending: Obstetrics and Gynecology | Admitting: Obstetrics and Gynecology

## 2021-06-13 ENCOUNTER — Other Ambulatory Visit: Payer: Self-pay

## 2021-06-13 DIAGNOSIS — Z3482 Encounter for supervision of other normal pregnancy, second trimester: Secondary | ICD-10-CM | POA: Diagnosis present

## 2021-06-16 ENCOUNTER — Ambulatory Visit (INDEPENDENT_AMBULATORY_CARE_PROVIDER_SITE_OTHER): Payer: Medicaid Other | Admitting: Obstetrics and Gynecology

## 2021-06-16 ENCOUNTER — Encounter: Payer: Self-pay | Admitting: Obstetrics and Gynecology

## 2021-06-16 ENCOUNTER — Other Ambulatory Visit: Payer: Self-pay

## 2021-06-16 VITALS — BP 118/68 | Wt 224.0 lb

## 2021-06-16 DIAGNOSIS — Z3A21 21 weeks gestation of pregnancy: Secondary | ICD-10-CM

## 2021-06-16 DIAGNOSIS — Z3480 Encounter for supervision of other normal pregnancy, unspecified trimester: Secondary | ICD-10-CM

## 2021-06-16 DIAGNOSIS — Z8759 Personal history of other complications of pregnancy, childbirth and the puerperium: Secondary | ICD-10-CM

## 2021-06-16 DIAGNOSIS — Z3482 Encounter for supervision of other normal pregnancy, second trimester: Secondary | ICD-10-CM

## 2021-06-16 DIAGNOSIS — Z113 Encounter for screening for infections with a predominantly sexual mode of transmission: Secondary | ICD-10-CM

## 2021-06-16 DIAGNOSIS — Z8632 Personal history of gestational diabetes: Secondary | ICD-10-CM

## 2021-06-16 DIAGNOSIS — Z131 Encounter for screening for diabetes mellitus: Secondary | ICD-10-CM

## 2021-06-16 DIAGNOSIS — O99212 Obesity complicating pregnancy, second trimester: Secondary | ICD-10-CM

## 2021-06-16 LAB — POCT URINALYSIS DIPSTICK OB
Glucose, UA: NEGATIVE
POC,PROTEIN,UA: NEGATIVE

## 2021-06-16 NOTE — Progress Notes (Signed)
Routine Prenatal Care Visit  Subjective  Gina Gomez is a 25 y.o. G2P1001 at [redacted]w[redacted]d being seen today for ongoing prenatal care.  She is currently monitored for the following issues for this low-risk pregnancy and has History of gestational diabetes; Obesity BMI=39.7; Supervision of other normal pregnancy, antepartum; Obesity affecting pregnancy; and History of gestational hypertension on their problem list.  ----------------------------------------------------------------------------------- Patient reports no complaints.    . Vag. Bleeding: None.  Movement: Present. Leaking Fluid denies.  ----------------------------------------------------------------------------------- The following portions of the patient's history were reviewed and updated as appropriate: allergies, current medications, past family history, past medical history, past social history, past surgical history and problem list. Problem list updated.  Objective  Blood pressure 118/68, weight 224 lb (101.6 kg), last menstrual period 01/15/2021. Pregravid weight 219 lb (99.3 kg) Total Weight Gain 5 lb (2.268 kg) Urinalysis: Urine Protein Negative  Urine Glucose Negative  Fetal Status: Fetal Heart Rate (bpm): 150   Movement: Present     General:  Alert, oriented and cooperative. Patient is in no acute distress.  Skin: Skin is warm and dry. No rash noted.   Cardiovascular: Normal heart rate noted  Respiratory: Normal respiratory effort, no problems with respiration noted  Abdomen: Soft, gravid, appropriate for gestational age. Pain/Pressure: Absent     Pelvic:  Cervical exam deferred        Extremities: Normal range of motion.     Mental Status: Normal mood and affect. Normal behavior. Normal judgment and thought content.   Assessment   25 y.o. G2P1001 at [redacted]w[redacted]d by  10/22/2021, by Last Menstrual Period presenting for routine prenatal visit  Plan   pregnancy Problems (from 03/31/21 to present)     Problem Noted Resolved    Obesity affecting pregnancy 03/31/2021 by Conard Novak, MD No   History of gestational hypertension 03/31/2021 by Conard Novak, MD No   Overview Signed 03/31/2021 11:28 AM by Conard Novak, MD    [x]  bASA p 12 weeks       Supervision of other normal pregnancy, antepartum 02/25/2021 by 02/27/2021, MD No   Overview Addendum 06/16/2021  9:57 AM by 06/18/2021, MD    [facility] Prenatal Labs  Dating L=6 Blood type: O/Positive/-- (06/06 1004)   Genetic Screen 1 Screen:    AFP:     Quad:     NIPS: Antibody:Negative (06/06 1004)  Anatomic 09-22-1993 Complete 05/2021 Rubella: 1.00 (06/06 1004)  Varicella: Non-Immune  GTT Early: failed, passed 3h               Third trimester:  RPR: Non Reactive (06/06 1004)   Rhogam  HBsAg: Negative (06/06 1004)   TDaP vaccine                       Flu Shot: HIV: Non Reactive (06/06 1004)   Baby Food                                GBS:   Contraception  Pap:  CBB     CS/VBAC    Support Person            History of gestational diabetes 12/20/2015 by 12/22/2015, RN No   Overview Signed 03/31/2021 11:05 AM by 05/31/2021, MD    [ ]  early 1h gtt            Preterm  labor symptoms and general obstetric precautions including but not limited to vaginal bleeding, contractions, leaking of fluid and fetal movement were reviewed in detail with the patient. Please refer to After Visit Summary for other counseling recommendations.   - anatomy screen complete at hospital. - desires BTL, sign ppw at next visit.   Return in about 4 weeks (around 07/14/2021) for 28 week labs (3 hour GTT) with Routine Prenatal Appointment.   Thomasene Mohair, MD, [facility], [facility] 06/16/2021 10:11 AM

## 2021-07-15 NOTE — Telephone Encounter (Signed)
Pt calling; is 25w; having really bad back pain and a little bit of cramping; cramping is stronger today than it has been; pt has appt in [place] tomorrow; didn't know if PH would want to see her this pm instead.  Adv will get back with her.

## 2021-07-16 ENCOUNTER — Encounter: Payer: Medicaid Other | Admitting: Obstetrics and Gynecology

## 2021-07-16 ENCOUNTER — Ambulatory Visit (INDEPENDENT_AMBULATORY_CARE_PROVIDER_SITE_OTHER): Payer: Medicaid Other | Admitting: Obstetrics and Gynecology

## 2021-07-16 ENCOUNTER — Other Ambulatory Visit: Payer: Self-pay

## 2021-07-16 VITALS — BP 116/75 | Wt 229.0 lb

## 2021-07-16 DIAGNOSIS — Z3480 Encounter for supervision of other normal pregnancy, unspecified trimester: Secondary | ICD-10-CM

## 2021-07-16 DIAGNOSIS — Z3A26 26 weeks gestation of pregnancy: Secondary | ICD-10-CM

## 2021-07-16 LAB — POCT URINALYSIS DIPSTICK OB
Glucose, UA: NEGATIVE
POC,PROTEIN,UA: NEGATIVE

## 2021-07-16 NOTE — Progress Notes (Signed)
ROB - was having low back pain 07/15/21, no pain now. RM 2

## 2021-07-16 NOTE — Progress Notes (Signed)
Routine Prenatal Care Visit  Subjective  Gina Gomez is a 25 y.o. G2P1001 at [redacted]w[redacted]d being seen today for ongoing prenatal care.  She is currently monitored for the following issues for this high-risk pregnancy and has History of gestational diabetes; Obesity BMI=39.7; Supervision of other normal pregnancy, antepartum; Obesity affecting pregnancy; and History of gestational hypertension on their problem list.  ----------------------------------------------------------------------------------- Patient reports  had back pain yesterday that was self limited and since resolved .   Contractions: Not present. Vag. Bleeding: None.  Movement: Present. Denies leaking of fluid.  ----------------------------------------------------------------------------------- The following portions of the patient's history were reviewed and updated as appropriate: allergies, current medications, past family history, past medical history, past social history, past surgical history and problem list. Problem list updated.   Objective  Blood pressure 116/75, weight 229 lb (103.9 kg), last menstrual period 01/15/2021. Pregravid weight 219 lb (99.3 kg) Total Weight Gain 10 lb (4.536 kg) Body mass index is 41.88 kg/m.  Urinalysis:      Fetal Status: Fetal Heart Rate (bpm): 150 Fundal Height: 26 cm Movement: Present     General:  Alert, oriented and cooperative. Patient is in no acute distress.  Skin: Skin is warm and dry. No rash noted.   Cardiovascular: Normal heart rate noted  Respiratory: Normal respiratory effort, no problems with respiration noted  Abdomen: Soft, gravid, appropriate for gestational age. Pain/Pressure: Absent     Pelvic:  Cervical exam deferred        Extremities: Normal range of motion.     ental Status: Normal mood and affect. Normal behavior. Normal judgment and thought content.     Assessment   25 y.o. G2P1001 at [redacted]w[redacted]d by  10/22/2021, by Last Menstrual Period presenting for work-in  prenatal visit  Plan   pregnancy Problems (from 03/31/21 to present)     Problem Noted Resolved   Obesity affecting pregnancy 03/31/2021 by Conard Novak, MD No   History of gestational hypertension 03/31/2021 by Conard Novak, MD No   Overview Signed 03/31/2021 11:28 AM by Conard Novak, MD    [x]  bASA p 12 weeks      Supervision of other normal pregnancy, antepartum 02/25/2021 by 02/27/2021, MD No   Overview Addendum 06/16/2021  9:57 AM by 06/18/2021, MD    [facility] Prenatal Labs  Dating L=6 Blood type: O/Positive/-- (06/06 1004)   Genetic Screen 1 Screen:    AFP:     Quad:     NIPS: Antibody:Negative (06/06 1004)  Anatomic 09-22-1993 Complete 05/2021 Rubella: 1.00 (06/06 1004)  Varicella: Non-Immune  GTT Early: failed, passed 3h               Third trimester:  RPR: Non Reactive (06/06 1004)   Rhogam  HBsAg: Negative (06/06 1004)   TDaP vaccine                       Flu Shot: HIV: Non Reactive (06/06 1004)   Baby Food                                GBS:   Contraception  Pap:  CBB     CS/VBAC    Support Person            History of gestational diabetes 12/20/2015 by 12/22/2015, RN No   Overview Signed 03/31/2021 11:05 AM by 05/31/2021,  Mila Homer, MD    [ ]  early 1h gtt           Gestational age appropriate obstetric precautions including but not limited to vaginal bleeding, contractions, leaking of fluid and fetal movement were reviewed in detail with the patient.    - discussed supportive measures for back pain in pregnancy should symptoms return - keep ROB next week  Return if symptoms worsen or fail to improve, keep ROB visit.  , MD, [facility], [facility] 07/16/2021, 1:51 PM

## 2021-07-21 ENCOUNTER — Encounter: Payer: Self-pay | Admitting: Obstetrics and Gynecology

## 2021-07-21 ENCOUNTER — Other Ambulatory Visit: Payer: Medicaid Other

## 2021-07-21 ENCOUNTER — Other Ambulatory Visit: Payer: Self-pay

## 2021-07-21 ENCOUNTER — Ambulatory Visit (INDEPENDENT_AMBULATORY_CARE_PROVIDER_SITE_OTHER): Payer: Medicaid Other | Admitting: Obstetrics and Gynecology

## 2021-07-21 VITALS — BP 126/80 | Wt 228.0 lb

## 2021-07-21 DIAGNOSIS — Z8759 Personal history of other complications of pregnancy, childbirth and the puerperium: Secondary | ICD-10-CM

## 2021-07-21 DIAGNOSIS — O99212 Obesity complicating pregnancy, second trimester: Secondary | ICD-10-CM

## 2021-07-21 DIAGNOSIS — Z131 Encounter for screening for diabetes mellitus: Secondary | ICD-10-CM

## 2021-07-21 DIAGNOSIS — Z3482 Encounter for supervision of other normal pregnancy, second trimester: Secondary | ICD-10-CM

## 2021-07-21 DIAGNOSIS — Z113 Encounter for screening for infections with a predominantly sexual mode of transmission: Secondary | ICD-10-CM

## 2021-07-21 DIAGNOSIS — Z8632 Personal history of gestational diabetes: Secondary | ICD-10-CM

## 2021-07-21 DIAGNOSIS — Z3480 Encounter for supervision of other normal pregnancy, unspecified trimester: Secondary | ICD-10-CM

## 2021-07-21 DIAGNOSIS — Z3A26 26 weeks gestation of pregnancy: Secondary | ICD-10-CM

## 2021-07-21 NOTE — Progress Notes (Signed)
Routine Prenatal Care Visit  Subjective  Gina Gomez is a 25 y.o. G2P1001 at [redacted]w[redacted]d being seen today for ongoing prenatal care.  She is currently monitored for the following issues for this low-risk pregnancy and has History of gestational diabetes; Obesity BMI=39.7; Supervision of other normal pregnancy, antepartum; Obesity affecting pregnancy; and History of gestational hypertension on their problem list.  ----------------------------------------------------------------------------------- Patient reports no complaints.   Contractions: Not present. Vag. Bleeding: None.  Movement: Present. Leaking Fluid denies.  ----------------------------------------------------------------------------------- The following portions of the patient's history were reviewed and updated as appropriate: allergies, current medications, past family history, past medical history, past social history, past surgical history and problem list. Problem list updated.  Objective  Blood pressure 126/80, weight 228 lb (103.4 kg), last menstrual period 01/15/2021. Pregravid weight 219 lb (99.3 kg) Total Weight Gain 9 lb (4.082 kg) Urinalysis: Urine Protein    Urine Glucose    Fetal Status: Fetal Heart Rate (bpm): 150 Fundal Height: 27 cm Movement: Present     General:  Alert, oriented and cooperative. Patient is in no acute distress.  Skin: Skin is warm and dry. No rash noted.   Cardiovascular: Normal heart rate noted  Respiratory: Normal respiratory effort, no problems with respiration noted  Abdomen: Soft, gravid, appropriate for gestational age. Pain/Pressure: Absent     Pelvic:  Cervical exam deferred        Extremities: Normal range of motion.     Mental Status: Normal mood and affect. Normal behavior. Normal judgment and thought content.   Assessment   25 y.o. G2P1001 at [redacted]w[redacted]d by  10/22/2021, by Last Menstrual Period presenting for routine prenatal visit  Plan   pregnancy Problems (from 03/31/21 to present)      Problem Noted Resolved   Obesity affecting pregnancy 03/31/2021 by Gina Novak, MD No   History of gestational hypertension 03/31/2021 by Gina Novak, MD No   Overview Signed 03/31/2021 11:28 AM by Gina Novak, MD    [x]  bASA p 12 weeks      Supervision of other normal pregnancy, antepartum 02/25/2021 by 02/27/2021, MD No   Overview Addendum 07/21/2021  9:21 AM by 07/23/2021, MD    [facility] Prenatal Labs  Dating L=6 Blood type: O/Positive/-- (06/06 1004)   Genetic Screen 1 Screen:    AFP:     Quad:     NIPS: Antibody:Negative (06/06 1004)  Anatomic 09-22-1993 Complete 05/2021 Rubella: 1.00 (06/06 1004)  Varicella: Non-Immune  GTT Early: failed, passed 3h               Third trimester:  RPR: Non Reactive (06/06 1004)   Rhogam n/a HBsAg: Negative (06/06 1004)   TDaP vaccine                       Flu Shot: n/a HIV: Non Reactive (06/06 1004)   Baby Food                                GBS:   Contraception  desires BTL, MCD ppw []  Pap: NILM, [facility] at [facility]  CBB     CS/VBAC    Support Person            History of gestational diabetes 12/20/2015 by [facility], Gina Gomez No   Overview Signed 03/31/2021 11:05 AM by Gina Hamburger, MD    [ ]  early  1h gtt           Preterm labor symptoms and general obstetric precautions including but not limited to vaginal bleeding, contractions, leaking of fluid and fetal movement were reviewed in detail with the patient. Please refer to After Visit Summary for other counseling recommendations.   - 3 hour gtt today - schedule growth u/s for about 32 weeks. - still unsure about contraception  Return in about 3 weeks (around 08/11/2021) for Routine Prenatal Appointment.   Thomasene Mohair, MD, [facility], [facility] 07/21/2021 9:28 AM

## 2021-07-22 LAB — CBC WITH DIFFERENTIAL/PLATELET
Basophils Absolute: 0.1 10*3/uL (ref 0.0–0.2)
Basos: 0 %
EOS (ABSOLUTE): 0.1 10*3/uL (ref 0.0–0.4)
Eos: 1 %
Hematocrit: 34 % (ref 34.0–46.6)
Hemoglobin: 11.1 g/dL (ref 11.1–15.9)
Immature Grans (Abs): 0.3 10*3/uL — ABNORMAL HIGH (ref 0.0–0.1)
Immature Granulocytes: 3 %
Lymphocytes Absolute: 2.2 10*3/uL (ref 0.7–3.1)
Lymphs: 18 %
MCH: 28.1 pg (ref 26.6–33.0)
MCHC: 32.6 g/dL (ref 31.5–35.7)
MCV: 86 fL (ref 79–97)
Monocytes Absolute: 0.7 10*3/uL (ref 0.1–0.9)
Monocytes: 6 %
Neutrophils Absolute: 8.7 10*3/uL — ABNORMAL HIGH (ref 1.4–7.0)
Neutrophils: 72 %
Platelets: 228 10*3/uL (ref 150–450)
RBC: 3.95 x10E6/uL (ref 3.77–5.28)
RDW: 12.7 % (ref 11.7–15.4)
WBC: 12 10*3/uL — ABNORMAL HIGH (ref 3.4–10.8)

## 2021-07-22 LAB — RPR QUALITATIVE: RPR Ser Ql: NONREACTIVE

## 2021-07-22 LAB — HIV ANTIBODY (ROUTINE TESTING W REFLEX): HIV Screen 4th Generation wRfx: NONREACTIVE

## 2021-07-22 LAB — GESTATIONAL GLUCOSE TOLERANCE
Glucose, Fasting: 100 mg/dL — ABNORMAL HIGH (ref 65–94)
Glucose, GTT - 1 Hour: 194 mg/dL — ABNORMAL HIGH (ref 65–179)
Glucose, GTT - 2 Hour: 163 mg/dL — ABNORMAL HIGH (ref 65–154)
Glucose, GTT - 3 Hour: 79 mg/dL (ref 65–139)

## 2021-07-24 ENCOUNTER — Other Ambulatory Visit: Payer: Self-pay | Admitting: Obstetrics and Gynecology

## 2021-07-24 DIAGNOSIS — Z3480 Encounter for supervision of other normal pregnancy, unspecified trimester: Secondary | ICD-10-CM

## 2021-07-24 DIAGNOSIS — O24410 Gestational diabetes mellitus in pregnancy, diet controlled: Secondary | ICD-10-CM

## 2021-07-24 DIAGNOSIS — O24419 Gestational diabetes mellitus in pregnancy, unspecified control: Secondary | ICD-10-CM | POA: Insufficient documentation

## 2021-07-28 ENCOUNTER — Other Ambulatory Visit: Payer: Self-pay

## 2021-07-28 ENCOUNTER — Encounter: Payer: Self-pay | Admitting: *Deleted

## 2021-07-28 ENCOUNTER — Encounter: Payer: BLUE CROSS/BLUE SHIELD | Attending: Obstetrics and Gynecology | Admitting: *Deleted

## 2021-07-28 ENCOUNTER — Other Ambulatory Visit: Payer: Self-pay | Admitting: Advanced Practice Midwife

## 2021-07-28 VITALS — BP 100/60 | Ht 62.0 in | Wt 231.0 lb

## 2021-07-28 DIAGNOSIS — Z3480 Encounter for supervision of other normal pregnancy, unspecified trimester: Secondary | ICD-10-CM

## 2021-07-28 DIAGNOSIS — O24410 Gestational diabetes mellitus in pregnancy, diet controlled: Secondary | ICD-10-CM

## 2021-07-28 MED ORDER — ACCU-CHEK SOFTCLIX LANCETS MISC: 12 refills | Status: DC

## 2021-07-28 MED ORDER — ACCU-CHEK GUIDE VI STRP: ORAL_STRIP | 12 refills | Status: DC

## 2021-07-28 NOTE — Patient Instructions (Addendum)
Read booklet on Gestational Diabetes Follow Gestational Meal Planning Guidelines Avoid cold cereal for breakfast and fruit for breakfast if blood sugars elevated Avoid fruit juices Limit desserts/sweets Check blood sugars 4 x day - before breakfast and 2 hrs after every meal and record  Bring blood sugar log to all appointments Call MD for prescription for meter strips and lancets Strips   Accu-Chek Guide  Lancets   Accu-Chek Softclix Purchase urine ketone strips if instructed by MD and check urine ketones every am:  If + increase bedtime snack to 1 protein and 2 carbohydrate servings Walk 20-30 minutes at least 5 x week if permitted by MD Call back if you need an appointment with the dietitian

## 2021-07-28 NOTE — Progress Notes (Signed)
Diabetes Self-Management Education  Visit Type: First/Initial  Appt. Start Time: 0915 Appt. End Time: 1015  07/28/2021  Ms. USAA, identified by name and date of birth, is a 25 y.o. female with a diagnosis of Diabetes: Gestational Diabetes.   ASSESSMENT  Blood pressure 100/60, height 5\' 2"  (1.575 m), weight 231 lb (104.8 kg), last menstrual period 01/15/2021, estimated date of delivery 10/22/2021 Body mass index is 42.25 kg/m.   Diabetes Self-Management Education - 07/28/21 1026       Visit Information   Visit Type First/Initial      Initial Visit   Diabetes Type Gestational Diabetes    Are you currently following a meal plan? Yes    What type of meal plan do you follow? "more fruits and veggies"    Are you taking your medications as prescribed? No   Not taking Prenatal - needs to pick up another bottle   Date Diagnosed 1 week ago      Health Coping   How would you rate your overall health? Good      Psychosocial Assessment   Patient Belief/Attitude about Diabetes Other (comment)   "anxious for the baby"   Self-care barriers None    Self-management support Doctor's office;Family    Patient Concerns Nutrition/Meal planning;Glycemic Control    Special Needs None    Preferred Learning Style Auditory;Visual;Hands on    Learning Readiness Change in progress    How often do you need to have someone help you when you read instructions, pamphlets, or other written materials from your doctor or pharmacy? 1 - Never    What is the last grade level you completed in school? Assoc degree      Pre-Education Assessment   Patient understands the diabetes disease and treatment process. Needs Review    Patient understands incorporating nutritional management into lifestyle. Needs Review    Patient undertands incorporating physical activity into lifestyle. Needs Review    Patient understands using medications safely. Needs Instruction    Patient understands monitoring blood glucose,  interpreting and using results Needs Review    Patient understands prevention, detection, and treatment of acute complications. Needs Instruction    Patient understands prevention, detection, and treatment of chronic complications. Needs Instruction    Patient understands how to develop strategies to address psychosocial issues. Needs Instruction    Patient understands how to develop strategies to promote health/change behavior. Needs Instruction      Complications   Last HgB A1C per patient/outside source 5.3 %    How often do you check your blood sugar? 0 times/day (not testing)   Provided Accu-Chek Guide Me meter and instructed on use. BG upon return demonstration was 101 mg/dL at 07/30/21 am - 2 1/2 hrs pp.   Have you had a dilated eye exam in the past 12 months? Yes    Have you had a dental exam in the past 12 months? No    Are you checking your feet? Yes    How many days per week are you checking your feet? 7      Dietary Intake   Breakfast oatmeal, cereal and milk, fruit (strawberries, grapes, watermelon, banana)    Lunch left-overs and coffee    Snack (afternoon) wheat toast and peanut butter; fruit; popcorn; Special K bar    Dinner chicken, beef, pork; potatoes, peas, beans, corn, rice, pasta, green beans, broccoli; lettuce, tomatoes, cheese, eggs, cuccumbers    Snack (evening) same as afternoon snack    Beverage(s) water,  juice, coffee      Exercise   Exercise Type Light (walking / raking leaves)    How many days per week to you exercise? 5    How many minutes per day do you exercise? 30    Total minutes per week of exercise 150      Patient Education   Previous Diabetes Education Yes (please comment)   prior Gestational 6 years ago   Disease state  Definition of diabetes, type 1 and 2, and the diagnosis of diabetes;Factors that contribute to the development of diabetes    Nutrition management  Role of diet in the treatment of diabetes and the relationship between the three main  macronutrients and blood glucose level;Food label reading, portion sizes and measuring food.;Reviewed blood glucose goals for pre and post meals and how to evaluate the patients' food intake on their blood glucose level.    Physical activity and exercise  Role of exercise on diabetes management, blood pressure control and cardiac health.    Medications Other (comment)   Limited use of oral medications during pregnancy and possibility of insulin.   Monitoring Taught/evaluated SMBG meter.;Purpose and frequency of SMBG.;Taught/discussed recording of test results and interpretation of SMBG.;Identified appropriate SMBG and/or A1C goals.;Ketone testing, when, how.    Chronic complications Relationship between chronic complications and blood glucose control    Psychosocial adjustment Identified and addressed patients feelings and concerns about diabetes    Preconception care Pregnancy and GDM  Role of pre-pregnancy blood glucose control on the development of the fetus;Reviewed with patient blood glucose goals with pregnancy;Role of family planning for patients with diabetes      Individualized Goals (developed by patient)   Reducing Risk Other (comment)   Improve blood sugars     Outcomes   Expected Outcomes Demonstrated interest in learning. Expect positive outcomes    Program Status Not Completed             Individualized Plan for Diabetes Self-Management Training:   Learning Objective:  Patient will have a greater understanding of diabetes self-management. Patient education plan is to attend individual and/or group sessions per assessed needs and concerns.   Plan:   Patient Instructions  Read booklet on Gestational Diabetes Follow Gestational Meal Planning Guidelines Avoid cold cereal for breakfast and fruit for breakfast if blood sugars elevated Avoid fruit juices Limit desserts/sweets Check blood sugars 4 x day - before breakfast and 2 hrs after every meal and record  Bring blood  sugar log to all appointments Call MD for prescription for meter strips and lancets Strips   Accu-Chek Guide  Lancets   Accu-Chek Softclix Purchase urine ketone strips if instructed by MD and check urine ketones every am:  If + increase bedtime snack to 1 protein and 2 carbohydrate servings Walk 20-30 minutes at least 5 x week if permitted by MD Call back if you need an appointment with the dietitian  Expected Outcomes:  Demonstrated interest in learning. Expect positive outcomes  Education material provided:  Gestational Booklet Gestational Meal Planning Guidelines Simple Meal Plan Meter = Accu-Chek Guide Me Goals for a Healthy Pregnancy   If problems or questions, patient to contact team via:   Sharion Settler, RN, CCM, CDCES [phone]  Future DSME appointment: PRN The patient came to Gestational Diabetes classes 6 years ago. She has made recent diet changes and reports that she is able to recall some of the information. She didn't make a follow-up with the dietitian at this time.

## 2021-07-28 NOTE — Progress Notes (Signed)
Rx Accu-chek test strips and lancets sent for patient.

## 2021-08-13 ENCOUNTER — Encounter: Payer: Self-pay | Admitting: Obstetrics and Gynecology

## 2021-08-13 ENCOUNTER — Ambulatory Visit (INDEPENDENT_AMBULATORY_CARE_PROVIDER_SITE_OTHER): Payer: BLUE CROSS/BLUE SHIELD | Admitting: Obstetrics and Gynecology

## 2021-08-13 ENCOUNTER — Other Ambulatory Visit: Payer: Self-pay

## 2021-08-13 VITALS — BP 130/84 | Wt 234.0 lb

## 2021-08-13 DIAGNOSIS — Z3483 Encounter for supervision of other normal pregnancy, third trimester: Secondary | ICD-10-CM

## 2021-08-13 DIAGNOSIS — Z3A30 30 weeks gestation of pregnancy: Secondary | ICD-10-CM

## 2021-08-13 DIAGNOSIS — O24414 Gestational diabetes mellitus in pregnancy, insulin controlled: Secondary | ICD-10-CM

## 2021-08-13 DIAGNOSIS — Z23 Encounter for immunization: Secondary | ICD-10-CM

## 2021-08-13 DIAGNOSIS — Z8759 Personal history of other complications of pregnancy, childbirth and the puerperium: Secondary | ICD-10-CM

## 2021-08-13 DIAGNOSIS — O99213 Obesity complicating pregnancy, third trimester: Secondary | ICD-10-CM

## 2021-08-13 MED ORDER — LANTUS SOLOSTAR 100 UNIT/ML ~~LOC~~ SOPN: 5.0000 [IU] | PEN_INJECTOR | Freq: Every day | SUBCUTANEOUS | 5 refills | Status: DC

## 2021-08-13 NOTE — Progress Notes (Signed)
Routine Prenatal Care Visit  Subjective  Gina Gomez is a 25 y.o. G2P1001 at [redacted]w[redacted]d being seen today for ongoing prenatal care.  She is currently monitored for the following issues for this high-risk pregnancy and has History of gestational diabetes; Obesity BMI=39.7; Supervision of other normal pregnancy, antepartum; Obesity affecting pregnancy; History of gestational hypertension; and Gestational diabetes mellitus, currently pregnant on their problem list.  ----------------------------------------------------------------------------------- Patient reports no complaints.   Contractions: Not present. Vag. Bleeding: None.  Movement: Present. Leaking Fluid denies.  GDM: she brings a full blood glucose log today. See below:   ----------------------------------------------------------------------------------- The following portions of the patient's history were reviewed and updated as appropriate: allergies, current medications, past family history, past medical history, past social history, past surgical history and problem list. Problem list updated.  Objective  Blood pressure 130/84, weight 234 lb (106.1 kg), last menstrual period 01/15/2021. Pregravid weight 219 lb (99.3 kg) Total Weight Gain 15 lb (6.804 kg) Urinalysis: Urine Protein    Urine Glucose    Fetal Status: Fetal Heart Rate (bpm): 160 Fundal Height: 31 cm Movement: Present     General:  Alert, oriented and cooperative. Patient is in no acute distress.  Skin: Skin is warm and dry. No rash noted.   Cardiovascular: Normal heart rate noted  Respiratory: Normal respiratory effort, no problems with respiration noted  Abdomen: Soft, gravid, appropriate for gestational age. Pain/Pressure: Absent     Pelvic:  Cervical exam deferred        Extremities: Normal range of motion.     Mental Status: Normal mood and affect. Normal behavior. Normal judgment and thought content.   Assessment   25 y.o. G2P1001 at [redacted]w[redacted]d by  10/22/2021, by  Last Menstrual Period presenting for routine prenatal visit  Plan   pregnancy Problems (from 03/31/21 to present)     Problem Noted Resolved   Gestational diabetes mellitus, currently pregnant 07/24/2021 by Conard Novak, MD No   Obesity affecting pregnancy 03/31/2021 by Conard Novak, MD No   History of gestational hypertension 03/31/2021 by Conard Novak, MD No   Overview Signed 03/31/2021 11:28 AM by Conard Novak, MD    [x]  bASA p 12 weeks      Supervision of other normal pregnancy, antepartum 02/25/2021 by 02/27/2021, MD No   Overview Addendum 07/21/2021  9:21 AM by 07/23/2021, MD    [facility] Prenatal Labs  Dating L=6 Blood type: O/Positive/-- (06/06 1004)   Genetic Screen 1 Screen:    AFP:     Quad:     NIPS: Antibody:Negative (06/06 1004)  Anatomic 09-22-1993 Complete 05/2021 Rubella: 1.00 (06/06 1004)  Varicella: Non-Immune  GTT Early: failed, passed 3h               Third trimester:  RPR: Non Reactive (06/06 1004)   Rhogam n/a HBsAg: Negative (06/06 1004)   TDaP vaccine                       Flu Shot: n/a HIV: Non Reactive (06/06 1004)   Baby Food                                GBS:   Contraception  desires BTL, MCD ppw []  Pap: NILM, 2/22 at [facility]  CBB     CS/VBAC    Support Person  History of gestational diabetes 12/20/2015 by William Hamburger, RN No   Overview Signed 03/31/2021 11:05 AM by Conard Novak, MD    [ ]  early 1h gtt           Preterm labor symptoms and general obstetric precautions including but not limited to vaginal bleeding, contractions, leaking of fluid and fetal movement were reviewed in detail with the patient. Please refer to After Visit Summary for other counseling recommendations.   Start lantus 5 units QHS and work on dietary control of dinner for now.  If no improvement, will have to add a short acting insulin. TDaP today.  Growth u/s already scheduled with MFM  Return in about 4 weeks (around  09/10/2021) for ROB w MD, keep previously scheduled appointments.11/10/2021, MD, Thomasene Mohair OB/GYN, [facility] 08/13/2021 12:42 PM     No vb. No lof. TDAP today.  Call in insulin pen 5 units QHS TDaP today

## 2021-08-18 ENCOUNTER — Telehealth: Payer: Self-pay

## 2021-08-18 NOTE — Telephone Encounter (Signed)
Pt needs needles ordered for insulin pens; insulin pens were ordered Wed but not the needles.  [phone]

## 2021-08-20 ENCOUNTER — Other Ambulatory Visit: Payer: Self-pay | Admitting: Obstetrics and Gynecology

## 2021-08-20 DIAGNOSIS — O24414 Gestational diabetes mellitus in pregnancy, insulin controlled: Secondary | ICD-10-CM

## 2021-08-20 DIAGNOSIS — Z3480 Encounter for supervision of other normal pregnancy, unspecified trimester: Secondary | ICD-10-CM

## 2021-08-20 MED ORDER — INSULIN PEN NEEDLE 32G X 8 MM MISC: 3 refills | Status: DC

## 2021-08-20 NOTE — Telephone Encounter (Signed)
Pt aware.

## 2021-08-22 ENCOUNTER — Ambulatory Visit
Admission: RE | Admit: 2021-08-22 | Discharge: 2021-08-22 | Disposition: A | Discharge status: Home / Self Care | Payer: BLUE CROSS/BLUE SHIELD | Source: Ambulatory Visit | Attending: Obstetrics and Gynecology | Admitting: Obstetrics and Gynecology

## 2021-08-22 ENCOUNTER — Other Ambulatory Visit: Payer: Self-pay

## 2021-08-22 DIAGNOSIS — Z3480 Encounter for supervision of other normal pregnancy, unspecified trimester: Secondary | ICD-10-CM | POA: Insufficient documentation

## 2021-08-22 DIAGNOSIS — O99212 Obesity complicating pregnancy, second trimester: Secondary | ICD-10-CM | POA: Diagnosis present

## 2021-08-25 ENCOUNTER — Ambulatory Visit: Payer: BLUE CROSS/BLUE SHIELD

## 2021-08-26 ENCOUNTER — Ambulatory Visit (INDEPENDENT_AMBULATORY_CARE_PROVIDER_SITE_OTHER): Payer: Medicaid Other | Admitting: Obstetrics and Gynecology

## 2021-08-26 ENCOUNTER — Other Ambulatory Visit: Payer: Self-pay

## 2021-08-26 ENCOUNTER — Encounter: Payer: Self-pay | Admitting: Obstetrics and Gynecology

## 2021-08-26 VITALS — BP 132/84 | Wt 239.0 lb

## 2021-08-26 DIAGNOSIS — Z3A31 31 weeks gestation of pregnancy: Secondary | ICD-10-CM

## 2021-08-26 DIAGNOSIS — O24410 Gestational diabetes mellitus in pregnancy, diet controlled: Secondary | ICD-10-CM

## 2021-08-26 DIAGNOSIS — O24414 Gestational diabetes mellitus in pregnancy, insulin controlled: Secondary | ICD-10-CM

## 2021-08-26 DIAGNOSIS — Z3480 Encounter for supervision of other normal pregnancy, unspecified trimester: Secondary | ICD-10-CM

## 2021-08-26 DIAGNOSIS — Z8759 Personal history of other complications of pregnancy, childbirth and the puerperium: Secondary | ICD-10-CM

## 2021-08-26 MED ORDER — ACCU-CHEK GUIDE VI STRP: ORAL_STRIP | 12 refills | Status: DC

## 2021-08-26 NOTE — Progress Notes (Signed)
Routine Prenatal Care Visit  Subjective  Gina Gomez is a 25 y.o. G2P1001 at [redacted]w[redacted]d being seen today for ongoing prenatal care.  She is currently monitored for the following issues for this high-risk pregnancy and has History of gestational diabetes; Obesity BMI=39.7; Supervision of other normal pregnancy, antepartum; Obesity affecting pregnancy; History of gestational hypertension; and Gestational diabetes mellitus, currently pregnant on their problem list.  ----------------------------------------------------------------------------------- Patient reports no complaints.   Contractions: Not present. Vag. Bleeding: None.  Movement: Present. Leaking Fluid denies.  GDMA2: bring BG log. Fasting values still elevated to low 100s.  2h pps normal (except for 1) Growth u/s last week 50th%ile, afi normal ----------------------------------------------------------------------------------- The following portions of the patient's history were reviewed and updated as appropriate: allergies, current medications, past family history, past medical history, past social history, past surgical history and problem list. Problem list updated.  Objective  Blood pressure 132/84, weight 239 lb (108.4 kg), last menstrual period 01/15/2021. Pregravid weight 219 lb (99.3 kg) Total Weight Gain 20 lb (9.072 kg) Urinalysis: Urine Protein    Urine Glucose    Fetal Status: Fetal Heart Rate (bpm): 145   Movement: Present     General:  Alert, oriented and cooperative. Patient is in no acute distress.  Skin: Skin is warm and dry. No rash noted.   Cardiovascular: Normal heart rate noted  Respiratory: Normal respiratory effort, no problems with respiration noted  Abdomen: Soft, gravid, appropriate for gestational age. Pain/Pressure: Absent     Pelvic:  Cervical exam deferred        Extremities: Normal range of motion.     Mental Status: Normal mood and affect. Normal behavior. Normal judgment and thought content.    Imaging Results US OB Follow Up  Result Date: 08/24/2021 CLINICAL DATA:  Pregnancy.  Assess fetal growth.  Maternal diabetes EXAM: OBSTETRIC 14+ WK ULTRASOUND FOLLOW-UP FINDINGS: Number of Fetuses: 1 Heart Rate:  140 bpm Movement: Yes Presentation: Cephalic Previa: No Placental Location: Posterior Amniotic Fluid (Subjective): Normal Amniotic Fluid (Objective): AFI 12.4 cm (5%ile= 8.8 cm, 95%= 23.8 cm for 31 wks) FETAL BIOMETRY BPD:  7.9cm 31w 4d HC:    28.8cm 31w 4d AC:    26.9cm 31w 0d FL:    5.9cm 31w 0d Current Mean GA: 30w ford [place] EDC: 10/27/2021 Assigned GA: 31w 2d Assigned EDC: 10/22/2021 Estimated Fetal Weight:  1,693g 50%ile FETAL ANATOMY Previously completed on 06/13/2021 Technical Limitations: None Maternal Findings: Cervix:  Obscured. IMPRESSION: 1. Single live intrauterine gestation in cephalic presentation. 2. Assigned gestational age of [redacted] weeks 2 days. Adequate interval growth. 3. Estimated fetal weight is in the 50th percentile. 4. Amniotic fluid index of 12.4 cm, within normal limits. Electronically Signed   By: Duanne Guess D.O.   On: 08/24/2021 14:42     Assessment   25 y.o. G2P1001 at [redacted]w[redacted]d by  10/22/2021, by Last Menstrual Period presenting for routine prenatal visit  Plan   pregnancy Problems (from 03/31/21 to present)     Problem Noted Resolved   Gestational diabetes mellitus, currently pregnant 07/24/2021 by Conard Novak, MD No   Obesity affecting pregnancy 03/31/2021 by Conard Novak, MD No   History of gestational hypertension 03/31/2021 by Conard Novak, MD No   Overview Signed 03/31/2021 11:28 AM by Conard Novak, MD    [x]  bASA p 12 weeks      Supervision of other normal pregnancy, antepartum 02/25/2021 by 02/27/2021, MD No   Overview Addendum 07/21/2021  9:21  AM by Conard Novak, MD    [facility] Prenatal Labs  Dating L=6 Blood type: O/Positive/-- (06/06 1004)   Genetic Screen 1 Screen:    AFP:     Quad:     NIPS:  Antibody:Negative (06/06 1004)  Anatomic [place] Complete 05/2021 Rubella: 1.00 (06/06 1004)  Varicella: Non-Immune  GTT Early: failed, passed 3h               Third trimester:  RPR: Non Reactive (06/06 1004)   Rhogam n/a HBsAg: Negative (06/06 1004)   TDaP vaccine                       Flu Shot: n/a HIV: Non Reactive (06/06 1004)   Baby Food                                GBS:   Contraception  desires BTL, MCD ppw []  [facility]: [facility]ILM, [facility]  CBB     CS/VBAC    Support Person            History of gestational diabetes 12/20/2015 by 12/22/2015, RN No   Overview Signed 03/31/2021 11:05 AM by 05/31/2021, MD    [ ]  early 1h gtt           Preterm labor symptoms and general obstetric precautions including but not limited to vaginal bleeding, contractions, leaking of fluid and fetal movement were reviewed in detail with the patient. Please refer to After Visit Summary for other counseling recommendations.   - increase insulin to 13 units - refill on test strips - start APT - signed BTL ppw today  ROB/NST with MD follow up weekly starting in 1-2 weeks  Conard Novak, MD, OB/GYN, [facility] 08/26/2021 10:07 AM

## 2021-09-01 ENCOUNTER — Encounter: Payer: Medicaid Other | Admitting: Obstetrics

## 2021-09-02 ENCOUNTER — Ambulatory Visit (INDEPENDENT_AMBULATORY_CARE_PROVIDER_SITE_OTHER): Payer: BLUE CROSS/BLUE SHIELD | Admitting: Obstetrics & Gynecology

## 2021-09-02 ENCOUNTER — Other Ambulatory Visit: Payer: Self-pay

## 2021-09-02 ENCOUNTER — Encounter: Payer: Self-pay | Admitting: Obstetrics & Gynecology

## 2021-09-02 ENCOUNTER — Encounter: Payer: Medicaid Other | Admitting: Obstetrics and Gynecology

## 2021-09-02 VITALS — BP 120/80 | Wt 240.0 lb

## 2021-09-02 DIAGNOSIS — O99213 Obesity complicating pregnancy, third trimester: Secondary | ICD-10-CM | POA: Diagnosis not present

## 2021-09-02 DIAGNOSIS — O24410 Gestational diabetes mellitus in pregnancy, diet controlled: Secondary | ICD-10-CM

## 2021-09-02 DIAGNOSIS — O24414 Gestational diabetes mellitus in pregnancy, insulin controlled: Secondary | ICD-10-CM | POA: Diagnosis not present

## 2021-09-02 DIAGNOSIS — Z3480 Encounter for supervision of other normal pregnancy, unspecified trimester: Secondary | ICD-10-CM

## 2021-09-02 DIAGNOSIS — Z3A32 32 weeks gestation of pregnancy: Secondary | ICD-10-CM

## 2021-09-02 LAB — POCT URINALYSIS DIPSTICK OB
Glucose, UA: NEGATIVE
POC,PROTEIN,UA: NEGATIVE

## 2021-09-02 MED ORDER — ACCU-CHEK SOFTCLIX LANCETS MISC: 12 refills | Status: DC

## 2021-09-02 MED ORDER — ACCU-CHEK GUIDE VI STRP: ORAL_STRIP | 5 refills | Status: DC

## 2021-09-02 NOTE — Progress Notes (Signed)
Subjective  Fetal Movement? yes Contractions? no Leaking Fluid? no Vaginal Bleeding? no  Objective  BP 120/80   Wt 240 lb (108.9 kg)   LMP 01/15/2021   BMI 43.90 kg/m  General: NAD Pumonary: no increased work of breathing Abdomen: gravid, non-tender Extremities: no edema Psychiatric: mood appropriate, affect full  A NST procedure was performed with FHR monitoring and a normal baseline established, appropriate time of 20-40 minutes of evaluation, and accels >2 seen w 15x15 characteristics.  Results show a REACTIVE NST.   Assessment  25 y.o. G2P1001 at [redacted]w[redacted]d by  10/22/2021, by Last Menstrual Period presenting for routine prenatal visit  Plan   Problem List Items Addressed This Visit    Supervision of other normal pregnancy, antepartum  - PNV, FMC - PTL precautions discussed   Obesity affecting pregnancy - see below protocol   [redacted] weeks gestation of pregnancy       Insulin controlled gestational diabetes mellitus (GDM) in third trimester    - see below protocol - NST today and weekly - increase pm Insulin to 20U daily as fasting BS untouch by Insulin so far (100-110 range) The post prandial BS 90+% normal      pregnancy Problems (from 03/31/21 to present)    Problem     Gestational diabetes mellitus, currently pregnant     Overview Signed 09/02/2021  2:24 PM by Nadara Mustard, MD    Current Diabetic Medications:  Insulin  [x ] Aspirin 81 mg daily after 12 weeks; discontinue after 36 weeks (? A2/B GDM)  Required Referrals for A1GDM or A2GDM: [x ] Diabetes Education and Testing Supplies [x ] Nutrition Cousult  Baseline and surveillance labs (pulled in from [facility], refresh links as needed)  Lab Results  Component Value Date   CREATININE 0.61 02/14/2021   AST 15 02/14/2021   ALT 15 02/14/2021   PROTCRRATIO 0.27 (H) 07/12/2015   Antenatal Testing Class of DM U/S NST/AFI DELIVERY  Diabetes   A1 - good control - O24.410    A2 - good control - O24.419      A2  -  poor control or poor compliance - O24.419, E11.65   (Macrosomia or polyhydramnios) **E11.65 is extra code for poor control**    A2/B - O24.919  and B-C O24.319  Poor control B-C or D-R-F-T - O24.319  or  Type I DM - O24.019  20-38  20-38  20-24-28-32-36   20-24-28-32-35-38//fetal echo  20-24-27-30-33-36-38//fetal echo  40  32//2 x wk  32//2 x wk   32//2 x wk  28//BPP wkly then 32//2 x wk  40  39  PRN   39  PRN          Obesity affecting pregnancy     Overview Signed 09/02/2021  2:34 PM by Nadara Mustard, MD    BMI >=40 [x ] early 1h gtt - high, but normal 3 hour GTT [ ]  screen sleep apnea [ ]  anesthesia consult (early and late if BMI > 45) [x ] u/s for dating [x ]  [x ] nutritional goals [x ] folic acid 1mg  [x ] bASA (>12 weeks) [ ]  consider nutrition consult [ ]  consider maternal EKG 1st trimester [x ] Growth u/s 86 [x ], 74 [x ], 36 weeks [ ]  [x ] NST/AFI weekly 34+ weeks (34[] ,35[] ,36[] , 37[] , 38[] , 39[] , 40[] ) [x ] IOL by 41 weeks (scheduled, prn [] )      History of gestational hypertension 03/31/2021 by , MD No  Overview Signed 03/31/2021 11:28 AM by Conard Novak, MD    [x]  bASA p 12 weeks               [facility] Prenatal Labs  Dating L=6 Blood type: O/Positive/-- (06/06 1004)   Genetic Screen  NIPS:XX Antibody:Negative (06/06 1004)  Anatomic 09-22-1993 Complete 05/2021 Rubella: 1.00 (06/06 1004)  Varicella: Non-Immune  GTT Early: failed, passed 3h               Third trimester:  RPR: Non Reactive (06/06 1004)   Rhogam n/a HBsAg: Negative (06/06 1004)   TDaP vaccine                  Flu Shot: n/a HIV: Non Reactive (06/06 1004)   Baby Food                                GBS: p  Contraception  desires BTL, MCD ppw []  Pap: NILM, 2/22 at [facility]  CBB  no   CS/VBAC n/a   Support Person         3/22, MD, [facility] Ob/Gyn, [facility] 09/02/2021  2:35 PM

## 2021-09-02 NOTE — Addendum Note (Signed)
Addended by: Cornelius Moras D on: 09/02/2021 02:51 PM   Modules accepted: Orders

## 2021-09-02 NOTE — Patient Instructions (Signed)

## 2021-09-04 ENCOUNTER — Encounter: Payer: Medicaid Other | Admitting: Obstetrics and Gynecology

## 2021-09-04 ENCOUNTER — Telehealth: Payer: Self-pay

## 2021-09-04 NOTE — Telephone Encounter (Signed)
Erie Noe from [employer] calling; courtesy call to let us know accu-chek guide VIST has been approved; approval will be faxed.  If questions, [phone] opt 3.

## 2021-09-08 ENCOUNTER — Ambulatory Visit (INDEPENDENT_AMBULATORY_CARE_PROVIDER_SITE_OTHER): Payer: BLUE CROSS/BLUE SHIELD | Admitting: Obstetrics and Gynecology

## 2021-09-08 ENCOUNTER — Other Ambulatory Visit: Payer: Self-pay

## 2021-09-08 ENCOUNTER — Encounter: Payer: Self-pay | Admitting: Obstetrics and Gynecology

## 2021-09-08 VITALS — BP 126/84 | Wt 240.0 lb

## 2021-09-08 DIAGNOSIS — Z3A33 33 weeks gestation of pregnancy: Secondary | ICD-10-CM | POA: Diagnosis not present

## 2021-09-08 DIAGNOSIS — O24414 Gestational diabetes mellitus in pregnancy, insulin controlled: Secondary | ICD-10-CM

## 2021-09-08 DIAGNOSIS — Z3483 Encounter for supervision of other normal pregnancy, third trimester: Secondary | ICD-10-CM

## 2021-09-08 DIAGNOSIS — Z8759 Personal history of other complications of pregnancy, childbirth and the puerperium: Secondary | ICD-10-CM

## 2021-09-08 DIAGNOSIS — O99213 Obesity complicating pregnancy, third trimester: Secondary | ICD-10-CM

## 2021-09-08 NOTE — Patient Instructions (Signed)
Increase insulin to 25 units at night

## 2021-09-08 NOTE — Progress Notes (Signed)
Routine Prenatal Care Visit  Subjective  Gina Gomez is a 25 y.o. G2P1001 at [redacted]w[redacted]d being seen today for ongoing prenatal care.  She is currently monitored for the following issues for this low-risk pregnancy and has History of gestational diabetes; Obesity BMI=39.7; Supervision of other normal pregnancy, antepartum; Obesity affecting pregnancy; History of gestational hypertension; and Gestational diabetes mellitus, currently pregnant on their problem list.  ----------------------------------------------------------------------------------- Patient reports no complaints.   Contractions: Not present. Vag. Bleeding: None.  Movement: Present. Leaking Fluid denies.  ----------------------------------------------------------------------------------- The following portions of the patient's history were reviewed and updated as appropriate: allergies, current medications, past family history, past medical history, past social history, past surgical history and problem list. Problem list updated.  Objective  Blood pressure 126/84, weight 240 lb (108.9 kg), last menstrual period 01/15/2021. Pregravid weight 219 lb (99.3 kg) Total Weight Gain 21 lb (9.526 kg) Urinalysis: Urine Protein    Urine Glucose    Fetal Status: Fetal Heart Rate (bpm): 150   Movement: Present     General:  Alert, oriented and cooperative. Patient is in no acute distress.  Skin: Skin is warm and dry. No rash noted.   Cardiovascular: Normal heart rate noted  Respiratory: Normal respiratory effort, no problems with respiration noted  Abdomen: Soft, gravid, appropriate for gestational age. Pain/Pressure: Present     Pelvic:  Cervical exam deferred        Extremities: Normal range of motion.  Edema: None  Mental Status: Normal mood and affect. Normal behavior. Normal judgment and thought content.   NST: Baseline FHR: 150 beats/min Variability: moderate Accelerations: present Decelerations: absent Tocometry: not  done (Wandering baseline. Nearly constant fetal movement during exam. So, very reassuring)  Interpretation:  INDICATIONS: gestational diabetes mellitus RESULTS:  A NST procedure was performed with FHR monitoring and a normal baseline established, appropriate time of 20-40 minutes of evaluation, and accels >2 seen w 15x15 characteristics.  Results show a REACTIVE NST.    Assessment   25 y.o. G2P1001 at [redacted]w[redacted]d by  10/22/2021, by Last Menstrual Period presenting for routine prenatal visit  Plan   pregnancy Problems (from 03/31/21 to present)     Problem Noted Resolved   Gestational diabetes mellitus, currently pregnant 07/24/2021 by Conard Novak, MD No   Overview Addendum 09/08/2021 11:48 AM by Conard Novak, MD    Current Diabetic Medications:  Insulin 25 units as of 10/10 [x ] Aspirin 81 mg daily after 12 weeks; discontinue after 36 weeks (? A2/B GDM)  Required Referrals for A1GDM or A2GDM: [x ] Diabetes Education and Testing Supplies [x ] Nutrition Cousult  Baseline and surveillance labs (pulled in from [facility], refresh links as needed)  Lab Results  Component Value Date   CREATININE 0.61 02/14/2021   AST 15 02/14/2021   ALT 15 02/14/2021   PROTCRRATIO 0.27 (H) 07/12/2015  Antenatal Testing Class of DM U/S NST/AFI DELIVERY  Diabetes   A1 - good control - O24.410    A2 - good control - O24.419      A2  - poor control or poor compliance - O24.419, E11.65   (Macrosomia or polyhydramnios) **E11.65 is extra code for poor control**    A2/B - O24.919  and B-C O24.319  Poor control B-C or D-R-F-T - O24.319  or  Type I DM - O24.019  20-38  20-38  20-24-28-32-36   20-24-28-32-35-38//fetal echo  20-24-27-30-33-36-38//fetal echo  40  32//2 x wk  32//2 x wk   32//2 x wk  28//BPP wkly then 32//2 x wk  40  39  PRN   39  PRN          Obesity affecting pregnancy 03/31/2021 by Conard Novak, MD No   Overview Signed 09/02/2021  2:34 PM by Nadara Mustard, MD    BMI >=40 [x ] early 1h gtt - high, but normal 3 hour GTT [ ]  screen sleep apnea [ ]  anesthesia consult (early and late if BMI > 45) [x ] u/s for dating [x ]  [x ] nutritional goals [x ] folic acid 1mg  [x ] bASA (>12 weeks) [ ]  consider nutrition consult [ ]  consider maternal EKG 1st trimester [x ] Growth u/s 37 [x ], 34 [x ], 36 weeks [ ]  [x ] NST/AFI weekly 34+ weeks (34[] ,35[] ,36[] , 37[] , 38[] , 39[] , 40[] ) [x ] IOL by 41 weeks (scheduled, prn [] )      History of gestational hypertension 03/31/2021 by , MD No   Overview Signed 03/31/2021 11:28 AM by 25, MD    [x]  bASA p 12 weeks      Supervision of other normal pregnancy, antepartum 02/25/2021 by , MD No   Overview Addendum 09/02/2021  2:41 PM by 05/31/2021, MD    [facility] Prenatal Labs  Dating L=6 Blood type: O/Positive/-- (06/06 1004)   Genetic Screen NIPS:XX Antibody:Negative (06/06 1004)  Anatomic Conard Novak Complete 05/2021 Rubella: 1.00 (06/06 1004)  Varicella: Non-Immune  GTT Early: failed, passed 3h               Third trimester:  RPR: Non Reactive (06/06 1004)   Rhogam n/a HBsAg: Negative (06/06 1004)   TDaP vaccine 9/27      Flu Shot: n/a HIV: Non Reactive (06/06 1004)   Baby Food                                GBS:   Contraception  desires BTL, MCD ppw [x]  Pap: NILM, [facility]  CBB  no   CS/VBAC n/a   Support Person Husband 06/2021 Obesity        History of gestational diabetes 12/20/2015 by 09-22-1993, RN No   Overview Signed 03/31/2021 11:05 AM by 10/27, MD    [ ]  early 1h gtt           Preterm labor symptoms and general obstetric precautions including but not limited to vaginal bleeding, contractions, leaking of fluid and fetal movement were reviewed in detail with the patient. Please refer to After Visit Summary for other counseling recommendations.   Return for Keep previously scheduled  appointments.  09-22[birth year]1993, MD, OB/GYN, [facility] 09/08/2021 12:04 PM

## 2021-09-15 ENCOUNTER — Encounter: Payer: Self-pay | Admitting: Obstetrics and Gynecology

## 2021-09-15 ENCOUNTER — Ambulatory Visit (INDEPENDENT_AMBULATORY_CARE_PROVIDER_SITE_OTHER): Payer: BLUE CROSS/BLUE SHIELD | Admitting: Obstetrics and Gynecology

## 2021-09-15 ENCOUNTER — Other Ambulatory Visit: Payer: Self-pay

## 2021-09-15 VITALS — BP 126/88 | Wt 245.0 lb

## 2021-09-15 DIAGNOSIS — O24414 Gestational diabetes mellitus in pregnancy, insulin controlled: Secondary | ICD-10-CM | POA: Diagnosis not present

## 2021-09-15 DIAGNOSIS — Z3A30 30 weeks gestation of pregnancy: Secondary | ICD-10-CM

## 2021-09-15 DIAGNOSIS — Z8759 Personal history of other complications of pregnancy, childbirth and the puerperium: Secondary | ICD-10-CM

## 2021-09-15 DIAGNOSIS — O99213 Obesity complicating pregnancy, third trimester: Secondary | ICD-10-CM

## 2021-09-15 DIAGNOSIS — Z3480 Encounter for supervision of other normal pregnancy, unspecified trimester: Secondary | ICD-10-CM

## 2021-09-15 DIAGNOSIS — Z3A34 34 weeks gestation of pregnancy: Secondary | ICD-10-CM | POA: Diagnosis not present

## 2021-09-15 DIAGNOSIS — Z3483 Encounter for supervision of other normal pregnancy, third trimester: Secondary | ICD-10-CM

## 2021-09-15 MED ORDER — LANTUS SOLOSTAR 100 UNIT/ML ~~LOC~~ SOPN: 25.0000 [IU] | PEN_INJECTOR | Freq: Every day | SUBCUTANEOUS | 5 refills | Status: DC

## 2021-09-15 MED ORDER — GLYBURIDE 2.5 MG PO TABS
2.5000 mg | ORAL_TABLET | Freq: Every day | ORAL | 3 refills | Status: DC
Start: 2021-09-15 — End: 2021-10-16

## 2021-09-15 NOTE — Progress Notes (Signed)
Routine Prenatal Care Visit  Subjective  Gina Gomez is a 25 y.o. G2P1001 at [redacted]w[redacted]d being seen today for ongoing prenatal care.  She is currently monitored for the following issues for this low-risk pregnancy and has History of gestational diabetes; Obesity BMI=39.7; Supervision of other normal pregnancy, antepartum; Obesity affecting pregnancy; History of gestational hypertension; and Gestational diabetes mellitus, currently pregnant on their problem list.  ----------------------------------------------------------------------------------- Patient reports no complaints.   Contractions: Not present. Vag. Bleeding: None.  Movement: Present. Leaking Fluid denies.  GDMA2: brings full BG log.  All fasting elevated (upper 90s to lower 100s), occasional 2h pp elevated. Patient states that she is faithfully taking her insulin. ----------------------------------------------------------------------------------- The following portions of the patient's history were reviewed and updated as appropriate: allergies, current medications, past family history, past medical history, past social history, past surgical history and problem list. Problem list updated.  Objective  Blood pressure 126/88, weight 245 lb (111.1 kg), last menstrual period 01/15/2021. Pregravid weight 219 lb (99.3 kg) Total Weight Gain 26 lb (11.8 kg) Urinalysis: Urine Protein    Urine Glucose    Fetal Status: Fetal Heart Rate (bpm): 145   Movement: Present     General:  Alert, oriented and cooperative. Patient is in no acute distress.  Skin: Skin is warm and dry. No rash noted.   Cardiovascular: Normal heart rate noted  Respiratory: Normal respiratory effort, no problems with respiration noted  Abdomen: Soft, gravid, appropriate for gestational age. Pain/Pressure: Absent     Pelvic:  Cervical exam deferred        Extremities: Normal range of motion.  Edema: None  Mental Status: Normal mood and affect. Normal behavior. Normal  judgment and thought content.   NST: Baseline FHR: 145 beats/min Variability: moderate Accelerations: present Decelerations: absent Tocometry: not done  Interpretation:  INDICATIONS: gestational diabetes mellitus RESULTS:  A NST procedure was performed with FHR monitoring and a normal baseline established, appropriate time of 20-40 minutes of evaluation, and accels >2 seen w 15x15 characteristics.  Results show a REACTIVE NST.    Assessment   25 y.o. G2P1001 at [redacted]w[redacted]d by  10/22/2021, by Last Menstrual Period presenting for routine prenatal visit  Plan   pregnancy Problems (from 03/31/21 to present)     Problem Noted Resolved   Gestational diabetes mellitus, currently pregnant 07/24/2021 by Conard Novak, MD No   Overview Addendum 09/08/2021 11:48 AM by Conard Novak, MD    Current Diabetic Medications:  Insulin 25 units as of 10/10 [x ] Aspirin 81 mg daily after 12 weeks; discontinue after 36 weeks (? A2/B GDM)  Required Referrals for A1GDM or A2GDM: [x ] Diabetes Education and Testing Supplies [x ] Nutrition Cousult  Baseline and surveillance labs (pulled in from [facility], refresh links as needed)  Lab Results  Component Value Date   CREATININE 0.61 02/14/2021   AST 15 02/14/2021   ALT 15 02/14/2021   PROTCRRATIO 0.27 (H) 07/12/2015  Antenatal Testing Class of DM U/S NST/AFI DELIVERY  Diabetes   A1 - good control - O24.410    A2 - good control - O24.419      A2  - poor control or poor compliance - O24.419, E11.65   (Macrosomia or polyhydramnios) **E11.65 is extra code for poor control**    A2/B - O24.919  and B-C O24.319  Poor control B-C or D-R-F-T - O24.319  or  Type I DM - O24.019  20-38  20-38  20-24-28-32-36   20-24-28-32-35-38//fetal echo  20-24-27-30-33-36-38//fetal echo  40  32//2 x wk  32//2 x wk   32//2 x wk  28//BPP wkly then 32//2 x wk  40  39  PRN   39  PRN          Obesity affecting pregnancy 03/31/2021 by Conard Novak, MD No   Overview Signed 09/02/2021  2:34 PM by Nadara Mustard, MD    BMI >=40 [x ] early 1h gtt - high, but normal 3 hour GTT [ ]  screen sleep apnea [ ]  anesthesia consult (early and late if BMI > 45) [x ] u/s for dating [x ]  [x ] nutritional goals [x ] folic acid 1mg  [x ] bASA (>12 weeks) [ ]  consider nutrition consult [ ]  consider maternal EKG 1st trimester [x ] Growth u/s 9 [x ], 46 [x ], 36 weeks [ ]  [x ] NST/AFI weekly 34+ weeks (34[] ,35[] ,36[] , 37[] , 38[] , 39[] , 40[] ) [x ] IOL by 41 weeks (scheduled, prn [] )      History of gestational hypertension 03/31/2021 by , MD No   Overview Signed 03/31/2021 11:28 AM by 26, MD    [x]  bASA p 12 weeks      Supervision of other normal pregnancy, antepartum 02/25/2021 by , MD No   Overview Addendum 09/02/2021  2:41 PM by 05/31/2021, MD    [facility] Prenatal Labs  Dating L=6 Blood type: O/Positive/-- (06/06 1004)   Genetic Screen NIPS:XX Antibody:Negative (06/06 1004)  Anatomic Conard Novak Complete 05/2021 Rubella: 1.00 (06/06 1004)  Varicella: Non-Immune  GTT Early: failed, passed 3h               Third trimester:  RPR: Non Reactive (06/06 1004)   Rhogam n/a HBsAg: Negative (06/06 1004)   TDaP vaccine 9/27      Flu Shot: n/a HIV: Non Reactive (06/06 1004)   Baby Food                                GBS:   Contraception  desires BTL, MCD ppw [x]  Pap: NILM, 2/22 at [facility]  CBB  no   CS/VBAC n/a   Support Person Husband 06/2021 Obesity        History of gestational diabetes 12/20/2015 by 09-22-1993, RN No   Overview Signed 03/31/2021 11:05 AM by 10/27, MD    [ ]  early 1h gtt           Preterm labor symptoms and general obstetric precautions including but not limited to vaginal bleeding, contractions, leaking of fluid and fetal movement were reviewed in detail with the patient. Please refer to After Visit Summary for other counseling  recommendations.   - add glyburide to bedtime regimen since she is not getting any benefit from increases in insulin.  I lowered insulin to 20 units from 25 units.    Return in about 1 week (around 09/22/2021) for ROB, NST, w MD (may add weekly appts).   , MD, 3/22 OB/GYN, [facility] 09/15/2021 10:56 AM

## 2021-09-22 ENCOUNTER — Ambulatory Visit (INDEPENDENT_AMBULATORY_CARE_PROVIDER_SITE_OTHER): Payer: BLUE CROSS/BLUE SHIELD | Admitting: Obstetrics and Gynecology

## 2021-09-22 ENCOUNTER — Other Ambulatory Visit: Payer: Self-pay

## 2021-09-22 ENCOUNTER — Telehealth: Payer: BLUE CROSS/BLUE SHIELD | Admitting: Physician Assistant

## 2021-09-22 VITALS — BP 124/80 | Wt 244.0 lb

## 2021-09-22 DIAGNOSIS — Z3480 Encounter for supervision of other normal pregnancy, unspecified trimester: Secondary | ICD-10-CM | POA: Diagnosis not present

## 2021-09-22 DIAGNOSIS — O24414 Gestational diabetes mellitus in pregnancy, insulin controlled: Secondary | ICD-10-CM

## 2021-09-22 DIAGNOSIS — B9689 Other specified bacterial agents as the cause of diseases classified elsewhere: Secondary | ICD-10-CM | POA: Diagnosis not present

## 2021-09-22 DIAGNOSIS — O99213 Obesity complicating pregnancy, third trimester: Secondary | ICD-10-CM

## 2021-09-22 DIAGNOSIS — J019 Acute sinusitis, unspecified: Secondary | ICD-10-CM | POA: Diagnosis not present

## 2021-09-22 DIAGNOSIS — Z8759 Personal history of other complications of pregnancy, childbirth and the puerperium: Secondary | ICD-10-CM

## 2021-09-22 DIAGNOSIS — Z3A35 35 weeks gestation of pregnancy: Secondary | ICD-10-CM

## 2021-09-22 LAB — POCT URINALYSIS DIPSTICK OB

## 2021-09-22 MED ORDER — AMOXICILLIN 500 MG PO CAPS: 500.0000 mg | ORAL_CAPSULE | Freq: Two times a day (BID) | ORAL | 0 refills | Status: AC

## 2021-09-22 NOTE — Progress Notes (Signed)
Routine Prenatal Care Visit  Subjective  Gina Gomez is a 25 y.o. G2P1001 at [redacted]w[redacted]d being seen today for ongoing prenatal care.  She is currently monitored for the following issues for this high-risk pregnancy and has History of gestational diabetes; Obesity BMI=39.7; Supervision of other normal pregnancy, antepartum; Obesity affecting pregnancy; History of gestational hypertension; and Gestational diabetes mellitus, currently pregnant on their problem list.  ----------------------------------------------------------------------------------- Patient reports no complaints.   Contractions: Not present. Vag. Bleeding: None.  Movement: Present. Denies leaking of fluid.  ----------------------------------------------------------------------------------- The following portions of the patient's history were reviewed and updated as appropriate: allergies, current medications, past family history, past medical history, past social history, past surgical history and problem list. Problem list updated.   Objective  Blood pressure 124/80, weight 244 lb (110.7 kg), last menstrual period 01/15/2021. Pregravid weight 219 lb (99.3 kg) Total Weight Gain 25 lb (11.3 kg) Urinalysis:      Fetal Status: Fetal Heart Rate (bpm): 150   Movement: Present  Presentation: Vertex  General:  Alert, oriented and cooperative. Patient is in no acute distress.  Skin: Skin is warm and dry. No rash noted.   Cardiovascular: Normal heart rate noted  Respiratory: Normal respiratory effort, no problems with respiration noted  Abdomen: Soft, gravid, appropriate for gestational age. Pain/Pressure: Absent     Pelvic:  Cervical exam deferred        Extremities: Normal range of motion.     ental Status: Normal mood and affect. Normal behavior. Normal judgment and thought content.   Baseline: 150 Variability: moderate Accelerations: present Decelerations: absent Tocometry: N/A The patient was monitored for 30 minutes,  fetal heart rate tracing was deemed reactive, category I tracing,  Date Fasting Breakfast Lunch Dinner  10/18 90 110 74 112  10/19 92 72 79 114  10/20 87 79 81 92  10/21 86 89 116 101  10/22 86 110 108 112  10/23 93 99 78 110  10/24 87      Fingerstick in office today 141 given glucosuria   Assessment   25 y.o. G2P1001 at [redacted]w[redacted]d by  10/22/2021, by Last Menstrual Period presenting for routine prenatal visit  Plan   pregnancy Problems (from 03/31/21 to present)     Problem Noted Resolved   Gestational diabetes mellitus, currently pregnant 07/24/2021 by Conard Novak, MD No   Overview Addendum 09/08/2021 11:48 AM by Conard Novak, MD    Current Diabetic Medications:  Insulin 25 units as of 10/10 [x ] Aspirin 81 mg daily after 12 weeks; discontinue after 36 weeks (? A2/B GDM)  Required Referrals for A1GDM or A2GDM: [x ] Diabetes Education and Testing Supplies [x ] Nutrition [facility]  Baseline and surveillance labs (pulled in from [facility], refresh links as needed)  Lab Results  Component Value Date   CREATININE 0.61 02/14/2021   AST 15 02/14/2021   ALT 15 02/14/2021   PROTCRRATIO 0.27 (H) 07/12/2015  Antenatal Testing Class of DM U/S NST/AFI DELIVERY  Diabetes   A1 - good control - O24.410    A2 - good control - O24.419      A2  - poor control or poor compliance - O24.419, E11.65   (Macrosomia or polyhydramnios) **E11.65 is extra code for poor control**    A2/B - O24.919  and B-C O24.319  Poor control B-C or D-R-F-T - O24.319  or  Type I DM - O24.019  20-38  20-38  20-24-28-32-36   20-24-28-32-35-38//fetal echo  20-24-27-30-33-36-38//fetal echo  40  32//2 x wk  32//2 x wk   32//2 x wk  28//BPP wkly then 32//2 x wk  40  39  PRN   39  PRN          Obesity affecting pregnancy 03/31/2021 by Conard Novak, MD No   Overview Signed 09/02/2021  2:34 PM by Nadara Mustard, MD    BMI >=40 [x ] early 1h gtt - high, but normal 3 hour  GTT [ ]  screen sleep apnea [ ]  anesthesia consult (early and late if BMI > 45) [x ] u/s for dating [x ]  [x ] nutritional goals [x ] folic acid 1mg  [x ] bASA (>12 weeks) [ ]  consider nutrition consult [ ]  consider maternal EKG 1st trimester [x ] Growth u/s 45 [x ], 64 [x ], 36 weeks [ ]  [x ] NST/AFI weekly 34+ weeks (34[] ,35[] ,36[] , 37[] , 38[] , 39[] , 40[] ) [x ] IOL by 41 weeks (scheduled, prn [] )      History of gestational hypertension 03/31/2021 by , MD No   Overview Signed 03/31/2021 11:28 AM by 25, MD    [x]  bASA p 12 weeks      Supervision of other normal pregnancy, antepartum 02/25/2021 by , MD No   Overview Addendum 09/02/2021  2:41 PM by 05/31/2021, MD    [facility] Prenatal Labs  Dating L=6 Blood type: O/Positive/-- (06/06 1004)   Genetic Screen NIPS:XX Antibody:Negative (06/06 1004)  Anatomic Conard Novak Complete 05/2021 Rubella: 1.00 (06/06 1004)  Varicella: Non-Immune  GTT Early: failed, passed 3h               Third trimester:  RPR: Non Reactive (06/06 1004)   Rhogam n/a HBsAg: Negative (06/06 1004)   TDaP vaccine 9/27      Flu Shot: n/a HIV: Non Reactive (06/06 1004)   Baby Food                                GBS:   Contraception  desires BTL, MCD ppw [x]  Pap: NILM, 2/22 at [facility]  CBB  no   CS/VBAC n/a   Support Person Husband 06/2021 Obesity        History of gestational diabetes 12/20/2015 by 09-22-1993, RN No   Overview Signed 03/31/2021 11:05 AM by 10/27, MD    [ ]  early 1h gtt           Gestational age appropriate obstetric precautions including but not limited to vaginal bleeding, contractions, leaking of fluid and fetal movement were reviewed in detail with the patient.    - no change in insulin regimen, goo control on current regimen of Insulin and glybruide  since 10/17 - growth scan next week  Return in about 1 week (around 09/29/2021) for ROB NST with MD.  3/22, MD, [facility], [facility] 09/22/2021, 10:23 AM

## 2021-09-22 NOTE — Progress Notes (Signed)
Virtual Visit Consent   Gina Gomez, you are scheduled for a virtual visit with a [facility] provider today.     Just as with appointments in the office, your consent must be obtained to participate.  Your consent will be active for this visit and any virtual visit you may have with one of our providers in the next 365 days.     If you have a MyChart account, a copy of this consent can be sent to you electronically.  All virtual visits are billed to your insurance company just like a traditional visit in the office.    As this is a virtual visit, video technology does not allow for your provider to perform a traditional examination.  This may limit your provider's ability to fully assess your condition.  If your provider identifies any concerns that need to be evaluated in person or the need to arrange testing (such as labs, EKG, etc.), we will make arrangements to do so.     Although advances in technology are sophisticated, we cannot ensure that it will always work on either your end or our end.  If the connection with a video visit is poor, the visit may have to be switched to a telephone visit.  With either a video or telephone visit, we are not always able to ensure that we have a secure connection.     I need to obtain your verbal consent now.   Are you willing to proceed with your visit today?    Gina Gomez has provided verbal consent on 09/22/2021 for a virtual visit (video or telephone).   Margaretann Loveless, PA-C   Date: 09/22/2021 4:22 PM   Virtual Visit via Video Note   I, Margaretann Loveless, connected with  Gina Gomez  ([id number], July 07, 1996) on 09/22/21 at  4:15 PM EDT by a video-enabled telemedicine application and verified that I am speaking with the correct person using two identifiers.  Location: Patient: Virtual Visit Location Patient: Home Provider: Virtual Visit Location Provider: Home Office   I discussed the limitations of evaluation and management  by telemedicine and the availability of in person appointments. The patient expressed understanding and agreed to proceed.    History of Present Illness: Gina Gomez is a 25 y.o. who identifies as a female who was assigned female at birth, and is being seen today for possible sinus infection.  HPI: Sinusitis This is a new problem. The current episode started 1 to 4 weeks ago. The problem has been gradually worsening since onset. There has been no fever. Associated symptoms include congestion, coughing (mild), ear pain (bilateral), headaches, a hoarse voice, sinus pressure and a sore throat (improved). Pertinent negatives include no chills. Treatments tried: tylenol. The treatment provided no relief.   Covid testing is negative Patient is [redacted] weeks pregnant  Problems:  Patient Active Problem List   Diagnosis Date Noted   Gestational diabetes mellitus, currently pregnant 07/24/2021   Obesity affecting pregnancy 03/31/2021   History of gestational hypertension 03/31/2021   Supervision of other normal pregnancy, antepartum 02/25/2021   Obesity BMI=39.7 01/02/2020   History of gestational diabetes 12/20/2015    Allergies: No Known Allergies Medications:  Current Outpatient Medications:    amoxicillin (AMOXIL) 500 MG capsule, Take 1 capsule (500 mg total) by mouth 2 (two) times daily for 7 days., Disp: 14 capsule, Rfl: 0   Accu-Chek Softclix Lancets lancets, Use as instructed FOUR TIMES DAILY, Disp: 100 each, Rfl: 12  glucose blood (ACCU-CHEK GUIDE) test strip, Use as instructed FOUR TIMES daily, Disp: 200 each, Rfl: 5   glyBURIDE (DIABETA) 2.5 MG tablet, Take 1 tablet (2.5 mg total) by mouth at bedtime., Disp: 30 tablet, Rfl: 3   insulin glargine (LANTUS SOLOSTAR) 100 UNIT/ML Solostar Pen, Inject 25 Units into the skin at bedtime., Disp: 15 mL, Rfl: 5   Insulin Pen Needle 32G X 8 MM MISC, Use as directed with insulin, Disp: 100 each, Rfl: 3  Observations/Objective: Patient is  well-developed, well-nourished in no acute distress.  Resting comfortably at home.  Head is normocephalic, atraumatic.  No labored breathing.  Speech is clear and coherent with logical content.  Patient is alert and oriented at baseline.    Assessment and Plan: 1. Acute bacterial sinusitis - amoxicillin (AMOXIL) 500 MG capsule; Take 1 capsule (500 mg total) by mouth 2 (two) times daily for 7 days.  Dispense: 14 capsule; Refill: 0  - Worsening symptoms that have not responded to OTC medications.  - Will give amoxicillin  - Continue allergy medications.  - Stay well hydrated and get plenty of rest.  - Call or seek in person evaluation if no symptom improvement or if symptoms worsen.   Follow Up Instructions: I discussed the assessment and treatment plan with the patient. The patient was provided an opportunity to ask questions and all were answered. The patient agreed with the plan and demonstrated an understanding of the instructions.  A copy of instructions were sent to the patient via MyChart unless otherwise noted below.    The patient was advised to call back or seek an in-person evaluation if the symptoms worsen or if the condition fails to improve as anticipated.  Time:  I spent 9 minutes with the patient via telehealth technology discussing the above problems/concerns.    Margaretann Loveless, PA-C

## 2021-09-22 NOTE — Patient Instructions (Signed)
Meghen L Haman, thank you for joining Margaretann Loveless, PA-C for today's virtual visit.  While this provider is not your primary care provider (PCP), if your PCP is located in our provider database this encounter information will be shared with them immediately following your visit.  Consent: (Patient) Gina Gomez provided verbal consent for this virtual visit at the beginning of the encounter.  Current Medications:  Current Outpatient Medications:    amoxicillin (AMOXIL) 500 MG capsule, Take 1 capsule (500 mg total) by mouth 2 (two) times daily for 7 days., Disp: 14 capsule, Rfl: 0   Accu-Chek Softclix Lancets lancets, Use as instructed FOUR TIMES DAILY, Disp: 100 each, Rfl: 12   glucose blood (ACCU-CHEK GUIDE) test strip, Use as instructed FOUR TIMES daily, Disp: 200 each, Rfl: 5   glyBURIDE (DIABETA) 2.5 MG tablet, Take 1 tablet (2.5 mg total) by mouth at bedtime., Disp: 30 tablet, Rfl: 3   insulin glargine (LANTUS SOLOSTAR) 100 UNIT/ML Solostar Pen, Inject 25 Units into the skin at bedtime., Disp: 15 mL, Rfl: 5   Insulin Pen Needle 32G X 8 MM MISC, Use as directed with insulin, Disp: 100 each, Rfl: 3   Medications ordered in this encounter:  Meds ordered this encounter  Medications   amoxicillin (AMOXIL) 500 MG capsule    Sig: Take 1 capsule (500 mg total) by mouth 2 (two) times daily for 7 days.    Dispense:  14 capsule    Refill:  0    Order Specific Question:   Supervising Provider    Answer:   Hyacinth Meeker, BRIAN [[id number]]     *If you need refills on other medications prior to your next appointment, please contact your pharmacy*  Follow-Up: Call back or seek an in-person evaluation if the symptoms worsen or if the condition fails to improve as anticipated.  Other Instructions Sinusitis, Adult Sinusitis is inflammation of your sinuses. Sinuses are hollow spaces in the bones around your face. Your sinuses are located: Around your eyes. In the middle of your forehead. Behind  your nose. In your cheekbones. Mucus normally drains out of your sinuses. When your nasal tissues become inflamed or swollen, mucus can become trapped or blocked. This allows bacteria, viruses, and fungi to grow, which leads to infection. Most infections of the sinuses are caused by a virus. Sinusitis can develop quickly. It can last for up to 4 weeks (acute) or for more than 12 weeks (chronic). Sinusitis often develops after a cold. What are the causes? This condition is caused by anything that creates swelling in the sinuses or stops mucus from draining. This includes: Allergies. Asthma. Infection from bacteria or viruses. Deformities or blockages in your nose or sinuses. Abnormal growths in the nose (nasal polyps). Pollutants, such as chemicals or irritants in the air. Infection from fungi (rare). What increases the risk? You are more likely to develop this condition if you: Have a weak body defense system (immune system). Do a lot of swimming or diving. Overuse nasal sprays. Smoke. What are the signs or symptoms? The main symptoms of this condition are pain and a feeling of pressure around the affected sinuses. Other symptoms include: Stuffy nose or congestion. Thick drainage from your nose. Swelling and warmth over the affected sinuses. Headache. Upper toothache. A cough that may get worse at night. Extra mucus that collects in the throat or the back of the nose (postnasal drip). Decreased sense of smell and taste. Fatigue. A fever. Sore throat. Bad breath. How is  this diagnosed? This condition is diagnosed based on: Your symptoms. Your medical history. A physical exam. Tests to find out if your condition is acute or chronic. This may include: Checking your nose for nasal polyps. Viewing your sinuses using a device that has a light (endoscope). Testing for allergies or bacteria. Imaging tests, such as an MRI or CT scan. In rare cases, a bone biopsy may be done to rule  out more serious types of fungal sinus disease. How is this treated? Treatment for sinusitis depends on the cause and whether your condition is chronic or acute. If caused by a virus, your symptoms should go away on their own within 10 days. You may be given medicines to relieve symptoms. They include: Medicines that shrink swollen nasal passages (topical intranasal decongestants). Medicines that treat allergies (antihistamines). A spray that eases inflammation of the nostrils (topical intranasal corticosteroids). Rinses that help get rid of thick mucus in your nose (nasal saline washes). If caused by bacteria, your health care provider may recommend waiting to see if your symptoms improve. Most bacterial infections will get better without antibiotic medicine. You may be given antibiotics if you have: A severe infection. A weak immune system. If caused by narrow nasal passages or nasal polyps, you may need to have surgery. Follow these instructions at home: Medicines Take, use, or apply over-the-counter and prescription medicines only as told by your health care provider. These may include nasal sprays. If you were prescribed an antibiotic medicine, take it as told by your health care provider. Do not stop taking the antibiotic even if you start to feel better. Hydrate and humidify  Drink enough fluid to keep your urine pale yellow. Staying hydrated will help to thin your mucus. Use a cool mist humidifier to keep the humidity level in your home above 50%. Inhale steam for 10-15 minutes, 3-4 times a day, or as told by your health care provider. You can do this in the bathroom while a hot shower is running. Limit your exposure to cool or dry air. Rest Rest as much as possible. Sleep with your head raised (elevated). Make sure you get enough sleep each night. General instructions  Apply a warm, moist washcloth to your face 3-4 times a day or as told by your health care provider. This will  help with discomfort. Wash your hands often with soap and water to reduce your exposure to germs. If soap and water are not available, use hand sanitizer. Do not smoke. Avoid being around people who are smoking (secondhand smoke). Keep all follow-up visits as told by your health care provider. This is important. Contact a health care provider if: You have a fever. Your symptoms get worse. Your symptoms do not improve within 10 days. Get help right away if: You have a severe headache. You have persistent vomiting. You have severe pain or swelling around your face or eyes. You have vision problems. You develop confusion. Your neck is stiff. You have trouble breathing. Summary Sinusitis is soreness and inflammation of your sinuses. Sinuses are hollow spaces in the bones around your face. This condition is caused by nasal tissues that become inflamed or swollen. The swelling traps or blocks the flow of mucus. This allows bacteria, viruses, and fungi to grow, which leads to infection. If you were prescribed an antibiotic medicine, take it as told by your health care provider. Do not stop taking the antibiotic even if you start to feel better. Keep all follow-up visits as told by  your health care provider. This is important. This information is not intended to replace advice given to you by your health care provider. Make sure you discuss any questions you have with your health care provider. Document Revised: 04/18/2018 Document Reviewed: 04/18/2018 Elsevier Patient Education  2022 [employer].    If you have been instructed to have an in-person evaluation today at a local Urgent Care facility, please use the link below. It will take you to a list of all of our available [facility], including address, phone number and hours of operation. Please do not delay care.  [facility]  If you or a family member do not have a primary care provider, use the link below to  schedule a visit and establish care. When you choose a [facility] primary care physician or advanced practice provider, you gain a long-term partner in health. Find a Primary Care Provider  Learn more about [facility]'s in-office and virtual care options: [facility] - Get Care Now

## 2021-09-22 NOTE — Progress Notes (Signed)
ROB - NST, no concerns. RM 6 Finger stick 144

## 2021-09-24 ENCOUNTER — Ambulatory Visit
Admission: RE | Admit: 2021-09-24 | Discharge: 2021-09-24 | Disposition: A | Discharge status: Home / Self Care | Payer: BLUE CROSS/BLUE SHIELD | Source: Ambulatory Visit | Attending: Obstetrics and Gynecology | Admitting: Obstetrics and Gynecology

## 2021-09-24 ENCOUNTER — Other Ambulatory Visit: Payer: Self-pay

## 2021-09-24 DIAGNOSIS — O24414 Gestational diabetes mellitus in pregnancy, insulin controlled: Secondary | ICD-10-CM | POA: Diagnosis present

## 2021-09-24 DIAGNOSIS — Z3480 Encounter for supervision of other normal pregnancy, unspecified trimester: Secondary | ICD-10-CM | POA: Insufficient documentation

## 2021-09-29 ENCOUNTER — Encounter: Payer: Self-pay | Admitting: Obstetrics and Gynecology

## 2021-09-29 ENCOUNTER — Inpatient Hospital Stay (HOSPITAL_COMMUNITY): Admit: 2021-09-29 | Payer: BLUE CROSS/BLUE SHIELD

## 2021-09-29 ENCOUNTER — Other Ambulatory Visit: Payer: Self-pay

## 2021-09-29 ENCOUNTER — Ambulatory Visit (INDEPENDENT_AMBULATORY_CARE_PROVIDER_SITE_OTHER): Payer: BLUE CROSS/BLUE SHIELD | Admitting: Obstetrics and Gynecology

## 2021-09-29 VITALS — BP 126/80 | Wt 248.0 lb

## 2021-09-29 DIAGNOSIS — Z8759 Personal history of other complications of pregnancy, childbirth and the puerperium: Secondary | ICD-10-CM

## 2021-09-29 DIAGNOSIS — Z3480 Encounter for supervision of other normal pregnancy, unspecified trimester: Secondary | ICD-10-CM

## 2021-09-29 DIAGNOSIS — Z3A36 36 weeks gestation of pregnancy: Secondary | ICD-10-CM | POA: Diagnosis not present

## 2021-09-29 DIAGNOSIS — O24419 Gestational diabetes mellitus in pregnancy, unspecified control: Secondary | ICD-10-CM

## 2021-09-29 DIAGNOSIS — O99213 Obesity complicating pregnancy, third trimester: Secondary | ICD-10-CM

## 2021-09-29 DIAGNOSIS — O24414 Gestational diabetes mellitus in pregnancy, insulin controlled: Secondary | ICD-10-CM

## 2021-09-29 LAB — OB RESULTS CONSOLE GC/CHLAMYDIA: Gonorrhea: NEGATIVE

## 2021-09-29 NOTE — Progress Notes (Signed)
Routine Prenatal Care Visit  Subjective  Gina Gomez is a 25 y.o. G2P1001 at [redacted]w[redacted]d being seen today for ongoing prenatal care.  She is currently monitored for the following issues for this high-risk pregnancy and has History of gestational diabetes; Obesity BMI=39.7; Supervision of other normal pregnancy, antepartum; Obesity affecting pregnancy; History of gestational hypertension; and Gestational diabetes mellitus, currently pregnant on their problem list.  ----------------------------------------------------------------------------------- Patient reports no complaints.   Contractions: Not present. Vag. Bleeding: None.  Movement: Present. Leaking Fluid denies.  GDMA2: most values well-controlled on current regimen of insulin 20 units HS and glyburide 2.5 mg HS.   ----------------------------------------------------------------------------------- The following portions of the patient's history were reviewed and updated as appropriate: allergies, current medications, past family history, past medical history, past social history, past surgical history and problem list. Problem list updated.  Objective  Blood pressure 126/80, weight 248 lb (112.5 kg), last menstrual period 01/15/2021. Pregravid weight 219 lb (99.3 kg) Total Weight Gain 29 lb (13.2 kg) Urinalysis: Urine Protein    Urine Glucose    Fetal Status: Fetal Heart Rate (bpm): 145   Movement: Present  Presentation: Vertex  General:  Alert, oriented and cooperative. Patient is in no acute distress.  Skin: Skin is warm and dry. No rash noted.   Cardiovascular: Normal heart rate noted  Respiratory: Normal respiratory effort, no problems with respiration noted  Abdomen: Soft, gravid, appropriate for gestational age. Pain/Pressure: Absent     Pelvic:  Cervical exam deferred        Extremities: Normal range of motion.     Mental Status: Normal mood and affect. Normal behavior. Normal judgment and thought content.   Female chaperone  present for pelvic exam:   NST: Baseline FHR: 145 beats/min Variability: moderate Accelerations: present Decelerations: absent Tocometry: not done  Interpretation:  INDICATIONS: gestational diabetes mellitus RESULTS:  A NST procedure was performed with FHR monitoring and a normal baseline established, appropriate time of 20-40 minutes of evaluation, and accels >2 seen w 15x15 characteristics.  Results show a REACTIVE NST.    Assessment   25 y.o. G2P1001 at [redacted]w[redacted]d by  10/22/2021, by Last Menstrual Period presenting for routine prenatal visit  Plan   pregnancy Problems (from 03/31/21 to present)     Problem Noted Resolved   Gestational diabetes mellitus, currently pregnant 07/24/2021 by Conard Novak, MD No   Overview Addendum 09/08/2021 11:48 AM by Conard Novak, MD    Current Diabetic Medications:  Insulin 25 units as of 10/10 [x ] Aspirin 81 mg daily after 12 weeks; discontinue after 36 weeks (? A2/B GDM)  Required Referrals for A1GDM or A2GDM: [x ] Diabetes Education and Testing Supplies [x ] Nutrition Cousult  Baseline and surveillance labs (pulled in from [facility], refresh links as needed)  Lab Results  Component Value Date   CREATININE 0.61 02/14/2021   AST 15 02/14/2021   ALT 15 02/14/2021   PROTCRRATIO 0.27 (H) 07/12/2015  Antenatal Testing Class of DM U/S NST/AFI DELIVERY  Diabetes   A1 - good control - O24.410    A2 - good control - O24.419      A2  - poor control or poor compliance - O24.419, E11.65   (Macrosomia or polyhydramnios) **E11.65 is extra code for poor control**    A2/B - O24.919  and B-C O24.319  Poor control B-C or D-R-F-T - O24.319  or  Type I DM - O24.019  20-38  20-38  20-24-28-32-36   20-24-28-32-35-38//fetal echo  20-24-27-30-33-36-38//fetal echo  40  32//2 x wk  32//2 x wk   32//2 x wk  28//BPP wkly then 32//2 x wk  40  39  PRN   39  PRN          Obesity affecting pregnancy 03/31/2021 by Conard Novak, MD No   Overview Signed 09/02/2021  2:34 PM by Nadara Mustard, MD    BMI >=40 [x ] early 1h gtt - high, but normal 3 hour GTT [ ]  screen sleep apnea [ ]  anesthesia consult (early and late if BMI > 45) [x ] u/s for dating [x ]  [x ] nutritional goals [x ] folic acid 1mg  [x ] bASA (>12 weeks) [ ]  consider nutrition consult [ ]  consider maternal EKG 1st trimester [x ] Growth u/s 63 [x ], 75 [x ], 36 weeks [ ]  [x ] NST/AFI weekly 34+ weeks (34[] ,35[] ,36[] , 37[] , 38[] , 39[] , 40[] ) [x ] IOL by 41 weeks (scheduled, prn [] )      History of gestational hypertension 03/31/2021 by , MD No   Overview Signed 03/31/2021 11:28 AM by 25, MD    [x]  bASA p 12 weeks      Supervision of other normal pregnancy, antepartum 02/25/2021 by , MD No   Overview Addendum 09/02/2021  2:41 PM by 05/31/2021, MD    Clinic [facility] Prenatal Labs  Dating L=6 Blood type: O/Positive/-- (06/06 1004)   Genetic Screen NIPS:XX Antibody:Negative (06/06 1004)  Anatomic Conard Novak Complete 05/2021 Rubella: 1.00 (06/06 1004)  Varicella: Non-Immune  GTT Early: failed, passed 3h               Third trimester:  RPR: Non Reactive (06/06 1004)   Rhogam n/a HBsAg: Negative (06/06 1004)   TDaP vaccine 9/27      Flu Shot: n/a HIV: Non Reactive (06/06 1004)   Baby Food                                GBS:   Contraception  desires BTL, MCD ppw [x]  Pap: NILM, 2/22 at [facility]  CBB  no   CS/VBAC n/a   Support Person Husband 06/2021 Obesity        History of gestational diabetes 12/20/2015 by 09-22-1993, RN No   Overview Signed 03/31/2021 11:05 AM by 10/27, MD    [ ]  early 1h gtt           Preterm labor symptoms and general obstetric precautions including but not limited to vaginal bleeding, contractions, leaking of fluid and fetal movement were reviewed in detail with the patient. Please refer to After Visit Summary for other counseling  recommendations.   - continue current diabetes regimen - consider delivery at 39 weeks, if possible. - GBS/aptima today  Return in about 2 weeks (around 10/13/2021) for ROB, NST, w MD.   , MD, 3/22 OB/GYN, [facility] 09/29/2021 9:57 AM

## 2021-09-30 LAB — CERVICOVAGINAL ANCILLARY ONLY
Chlamydia: NEGATIVE
Comment: NEGATIVE
Comment: NORMAL
Neisseria Gonorrhea: NEGATIVE

## 2021-10-01 LAB — STREP GP B NAA: Strep Gp B NAA: NEGATIVE

## 2021-10-06 ENCOUNTER — Other Ambulatory Visit: Payer: Self-pay

## 2021-10-06 ENCOUNTER — Encounter: Payer: Self-pay | Admitting: Obstetrics and Gynecology

## 2021-10-06 ENCOUNTER — Ambulatory Visit (INDEPENDENT_AMBULATORY_CARE_PROVIDER_SITE_OTHER): Payer: BLUE CROSS/BLUE SHIELD | Admitting: Obstetrics and Gynecology

## 2021-10-06 VITALS — BP 128/78 | Wt 246.0 lb

## 2021-10-06 DIAGNOSIS — Z3483 Encounter for supervision of other normal pregnancy, third trimester: Secondary | ICD-10-CM | POA: Diagnosis not present

## 2021-10-06 DIAGNOSIS — Z8759 Personal history of other complications of pregnancy, childbirth and the puerperium: Secondary | ICD-10-CM

## 2021-10-06 DIAGNOSIS — O99213 Obesity complicating pregnancy, third trimester: Secondary | ICD-10-CM

## 2021-10-06 DIAGNOSIS — Z3A37 37 weeks gestation of pregnancy: Secondary | ICD-10-CM | POA: Diagnosis not present

## 2021-10-06 DIAGNOSIS — O24414 Gestational diabetes mellitus in pregnancy, insulin controlled: Secondary | ICD-10-CM

## 2021-10-06 NOTE — Progress Notes (Signed)
Routine Prenatal Care Visit  Subjective  Gina Gomez is a 25 y.o. G2P1001 at 102w5d being seen today for ongoing prenatal care.  She is currently monitored for the following issues for this high-risk pregnancy and has History of gestational diabetes; Obesity BMI=39.7; Supervision of other normal pregnancy, antepartum; Obesity affecting pregnancy; History of gestational hypertension; and Gestational diabetes mellitus, currently pregnant on their problem list.  ----------------------------------------------------------------------------------- Patient reports no complaints.   Contractions: Irritability. Vag. Bleeding: None.  Movement: Present. Leaking Fluid denies.  GDMA2: brings complete BG log. Very few abnormal values over the past week (see below). Denies hypoglycemia episodes.   ----------------------------------------------------------------------------------- The following portions of the patient's history were reviewed and updated as appropriate: allergies, current medications, past family history, past medical history, past social history, past surgical history and problem list. Problem list updated.  Objective  Blood pressure 128/78, weight 246 lb (111.6 kg), last menstrual period 01/15/2021. Pregravid weight 219 lb (99.3 kg) Total Weight Gain 27 lb (12.2 kg) Urinalysis: Urine Protein    Urine Glucose    Fetal Status: Fetal Heart Rate (bpm): 140   Movement: Present  Presentation: Vertex  General:  Alert, oriented and cooperative. Patient is in no acute distress.  Skin: Skin is warm and dry. No rash noted.   Cardiovascular: Normal heart rate noted  Respiratory: Normal respiratory effort, no problems with respiration noted  Abdomen: Soft, gravid, appropriate for gestational age. Pain/Pressure: Present     Pelvic:  Cervical exam performed Dilation: 4 Effacement (%): 40 Station: -2  Extremities: Normal range of motion.  Edema: None  Mental Status: Normal mood and affect. Normal  behavior. Normal judgment and thought content.   Female chaperone present for pelvic exam:   NST: Baseline FHR: 140 beats/min Variability: moderate Accelerations: present Decelerations: absent Tocometry: not done  Interpretation:  INDICATIONS: gestational diabetes mellitus RESULTS:  A NST procedure was performed with FHR monitoring and a normal baseline established, appropriate time of 20-40 minutes of evaluation, and accels >2 seen w 15x15 characteristics.  Results show a REACTIVE NST.    Assessment   25 y.o. G2P1001 at [redacted]w[redacted]d by  10/22/2021, by Last Menstrual Period presenting for routine prenatal visit  Plan   pregnancy Problems (from 03/31/21 to present)     Problem Noted Resolved   Gestational diabetes mellitus, currently pregnant 07/24/2021 by Will Bonnet, MD No   Overview Addendum 09/08/2021 11:48 AM by Will Bonnet, MD    Current Diabetic Medications:  Insulin 25 units as of 10/10 [x ] Aspirin 81 mg daily after 12 weeks; discontinue after 36 weeks (? A2/B GDM)  Required Referrals for A1GDM or A2GDM: [x ] Diabetes Education and Testing Supplies [x ] Nutrition Cousult  Baseline and surveillance labs (pulled in from [facility], refresh links as needed)  Lab Results  Component Value Date   CREATININE 0.61 02/14/2021   AST 15 02/14/2021   ALT 15 02/14/2021   PROTCRRATIO 0.27 (H) 07/12/2015  Antenatal Testing Class of DM U/S NST/AFI DELIVERY  Diabetes   A1 - good control - O24.410    A2 - good control - O24.419      A2  - poor control or poor compliance - O24.419, E11.65   (Macrosomia or polyhydramnios) **E11.65 is extra code for poor control**    A2/B - O24.919  and B-C O24.319  Poor control B-C or D-R-F-T - O24.319  or  Type I DM - O24.019  20-38  20-38  20-24-28-32-36   20-24-28-32-35-38//fetal echo  20-24-27-30-33-36-38//fetal  echo  40  32//2 x wk  32//2 x wk   32//2 x wk  28//BPP wkly then 32//2 x wk  40  39  PRN   39  PRN           Obesity affecting pregnancy 03/31/2021 by Conard Novak, MD No   Overview Signed 09/02/2021  2:34 PM by Nadara Mustard, MD    BMI >=40 [x ] early 1h gtt - high, but normal 3 hour GTT [ ]  screen sleep apnea [ ]  anesthesia consult (early and late if BMI > 45) [x ] u/s for dating [x ]  [x ] nutritional goals [x ] folic acid 1mg  [x ] bASA (>12 weeks) [ ]  consider nutrition consult [ ]  consider maternal EKG 1st trimester [x ] Growth u/s 94 [x ], 74 [x ], 36 weeks [ ]  [x ] NST/AFI weekly 34+ weeks (34[] ,35[] ,36[] , 37[] , 38[] , 39[] , 40[] ) [x ] IOL by 41 weeks (scheduled, prn [] )      History of gestational hypertension 03/31/2021 by , MD No   Overview Signed 03/31/2021 11:28 AM by 26, MD    [x]  bASA p 12 weeks      Supervision of other normal pregnancy, antepartum 02/25/2021 by , MD No   Overview Addendum 09/02/2021  2:41 PM by 05/31/2021, MD    [facility]inic [facility] Prenatal Labs  Dating L=6 Blood type: O/Positive/-- (06/06 1004)   Genetic Screen NIPS:XX Antibody:Negative (06/06 1004)  Anatomic Conard Novak Complete 05/2021 Rubella: 1.00 (06/06 1004)  Varicella: Non-Immune  GTT Early: failed, passed 3h               Third trimester:  RPR: Non Reactive (06/06 1004)   Rhogam n/a HBsAg: Negative (06/06 1004)   TDaP vaccine 9/27      Flu Shot: n/a HIV: Non Reactive (06/06 1004)   Baby Food                                GBS:   Contraception  desires BTL, MCD ppw [x]  Pap: NILM, 2/22 at [facility]  CBB  no   CS/VBAC n/a   Support Person Husband 06/2021 Obesity        History of gestational diabetes 12/20/2015 by 09-22-1993, RN No   Overview Signed 03/31/2021 11:05 AM by 10/27, MD    [ ]  early 1h gtt           Term labor symptoms and general obstetric precautions including but not limited to vaginal bleeding, contractions, leaking of fluid and fetal movement were reviewed in detail with the  patient. Please refer to After Visit Summary for other counseling recommendations.   IOL scheduled for 11/16 at 5AM. Precautions given as usual.  Low threshold to go to L&D given her cervical dilation.  Return in 9 days (on 10/15/2021) for for induction of labor at 5 AM.   3/22, MD, [facility], [facility] 10/06/2021 9:44 AM

## 2021-10-06 NOTE — Patient Instructions (Signed)
Induction Information: Your induction date: 10/15/21 at 5 AM Covid test: 10/13/21 between 9-10AM. Go to [facility].  Wear a mask and follow directions once you enter the building. This should not take long.  On your induction date, go to the ER a little before your induction time and let them know you're there for your labor induction.

## 2021-10-06 NOTE — Progress Notes (Signed)
  [facility] DOHMEN Santiago 1996/04/29 Medical record #: [id number] Phone #:  Home Phone [phone]  Home Phone [phone]    Prenatal Provider:[facility] Delivering Group[facility] Proposed admission date/time:10/15/21 at 5AM Method of induction:Pitocin  Weight: Filed Weights11/07/22 0903Weight:246 lb (111.6 kg) BMI Body mass index is 44.99 kg/m. HIV Negative HSV Negative EDC Estimated Date of Delivery: 11/23/22based on: LMP consistent with 6 week ultrasound  Gestational age on admission: [redacted]w[redacted]d Gravidity/parity:G2P1001  Cervix Score   0 1 2 3   Position Posterior Midposition Anterior   Consistency Firm Medium Soft   Effacement (%) 0-30 40-50 60-70 >80  Dilation (cm) Closed 1-2 3-4 >5  Baby's station -3 -2 -1 +1, +2   Bishop Score:5   Medical induction of labor   Medical Indications Adapted from ACOG Committee Opinion #[id number], "Medically Indicated Late Preterm and Early Term Deliveries," 2013.  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 (41 weeks) Mo-Di isolated (32.0-34.6) Pregestational, vascular compromise (37.0- 39.0)  PPROM (34.0) Multiple Gestation Gestational, diet controlled (40.0)  Hx of IUFD (39.0 weeks) Di-Di (38.0-38.6) Gestational, med  controlled (39.0)  Polyhydramnios, mild/moderate; SDV 8-16 or AFI 25-35 (39.0) Mo-Di (36.0-37.6) Gestational, uncontrolled (38.0-39.0)  Oligohydramnios (36.0-37.6); MVP <2 cm  For indications not listed above, delivery recommendations from maternal-fetal medicine consultant occurred on: Date:n/a with Dr. 02-16-1987 for indication of:n/a  Provider Signature: Gretta Cool Scheduled by:L&D Date:10/06/2021 9:46 AM   Call [phone] to finalize the induction date/time  [phone] (07/17)

## 2021-10-08 ENCOUNTER — Observation Stay
Admission: EM | Admit: 2021-10-08 | Discharge: 2021-10-08 | Disposition: A | Discharge status: Home / Self Care | Payer: BLUE CROSS/BLUE SHIELD | Attending: Advanced Practice Midwife | Admitting: Advanced Practice Midwife

## 2021-10-08 ENCOUNTER — Other Ambulatory Visit: Payer: Self-pay

## 2021-10-08 ENCOUNTER — Encounter: Payer: Self-pay | Admitting: Obstetrics & Gynecology

## 2021-10-08 DIAGNOSIS — O99210 Obesity complicating pregnancy, unspecified trimester: Secondary | ICD-10-CM | POA: Diagnosis present

## 2021-10-08 DIAGNOSIS — Z8632 Personal history of gestational diabetes: Secondary | ICD-10-CM

## 2021-10-08 DIAGNOSIS — Z3480 Encounter for supervision of other normal pregnancy, unspecified trimester: Secondary | ICD-10-CM

## 2021-10-08 DIAGNOSIS — Z7984 Long term (current) use of oral hypoglycemic drugs: Secondary | ICD-10-CM | POA: Insufficient documentation

## 2021-10-08 DIAGNOSIS — Z8759 Personal history of other complications of pregnancy, childbirth and the puerperium: Secondary | ICD-10-CM

## 2021-10-08 DIAGNOSIS — E669 Obesity, unspecified: Secondary | ICD-10-CM | POA: Insufficient documentation

## 2021-10-08 DIAGNOSIS — O26893 Other specified pregnancy related conditions, third trimester: Secondary | ICD-10-CM | POA: Diagnosis not present

## 2021-10-08 DIAGNOSIS — Z794 Long term (current) use of insulin: Secondary | ICD-10-CM | POA: Insufficient documentation

## 2021-10-08 DIAGNOSIS — Z3A38 38 weeks gestation of pregnancy: Secondary | ICD-10-CM | POA: Insufficient documentation

## 2021-10-08 DIAGNOSIS — O418X30 Other specified disorders of amniotic fluid and membranes, third trimester, not applicable or unspecified: Principal | ICD-10-CM | POA: Insufficient documentation

## 2021-10-08 DIAGNOSIS — O99213 Obesity complicating pregnancy, third trimester: Secondary | ICD-10-CM | POA: Diagnosis not present

## 2021-10-08 DIAGNOSIS — O24419 Gestational diabetes mellitus in pregnancy, unspecified control: Secondary | ICD-10-CM | POA: Diagnosis not present

## 2021-10-08 DIAGNOSIS — O24414 Gestational diabetes mellitus in pregnancy, insulin controlled: Secondary | ICD-10-CM | POA: Insufficient documentation

## 2021-10-08 LAB — RUPTURE OF MEMBRANE (ROM)PLUS: Rom Plus: NEGATIVE

## 2021-10-08 NOTE — OB Triage Note (Signed)
Pt discharged home in stable condition. RN provided discharge instructions to pt, including information on when to come back/follow up care. Pt verbalized understanding and all questions answered at this time.

## 2021-10-08 NOTE — OB Triage Note (Signed)
[facility] to [facility] with complaints of LOF. Pt is 38w, G2P1 with GDM. Pt states that she has noticed the LOF since last night and still dripping after using the restroom. Pt states she has had a small of amount of pink tinge to the discharge. Pt denies ctx, but has had some mild cramping. Pt states positive FM. Monitors applied and assessing. Initial FHT 150.

## 2021-10-08 NOTE — Discharge Summary (Signed)
Physician Final Progress Note  Patient ID: Gina Gomez MRN: [id number] DOB/AGE: 1996/11/01 25 y.o.  Admit date: 10/08/2021 Admitting provider: Rod Can, CNM Discharge date: 10/08/2021   Admission Diagnoses:  1) intrauterine pregnancy at [redacted]w[redacted]d  2) Leaking of fluid  Discharge Diagnoses:  Principal Problem:   Supervision of other normal pregnancy, antepartum Active Problems:   Obesity affecting pregnancy   Gestational diabetes mellitus, currently pregnant   Labor and delivery, indication for care   [redacted] weeks gestation of pregnancy    History of Present Illness: The patient is a 25 y.o. female G2P1001 at [redacted]w[redacted]d who presents for leaking of fluid with a pink tinge since last night. She has not worn a pad. She reports good fetal movement. Denies vaginal bleeding or contractions. She was admitted for observation, placed on monitors, labs done. All monitoring and labs are normal. She is discharged to home with instructions and precautions. Her induction is next Wednesday.   Past Medical History:  Diagnosis Date   Gestational diabetes    Medical history non-contributory    Obesity (BMI 35.0-39.9 without comorbidity)     Past Surgical History:  Procedure Laterality Date   NO PAST SURGERIES      No current facility-administered medications on file prior to encounter.   Current Outpatient Medications on File Prior to Encounter  Medication Sig Dispense Refill   Accu-Chek Softclix Lancets lancets Use as instructed FOUR TIMES DAILY 100 each 12   glucose blood (ACCU-CHEK GUIDE) test strip Use as instructed FOUR TIMES daily 200 each 5   glyBURIDE (DIABETA) 2.5 MG tablet Take 1 tablet (2.5 mg total) by mouth at bedtime. 30 tablet 3   insulin glargine (LANTUS SOLOSTAR) 100 UNIT/ML Solostar Pen Inject 25 Units into the skin at bedtime. 15 mL 5   Insulin Pen Needle 32G X 8 MM MISC Use as directed with insulin 100 each 3    No Known Allergies  Social History   Socioeconomic History    Marital status: Married    Spouse name: Not on file   Number of children: Not on file   Years of education: Not on file   Highest education level: Not on file  Occupational History   Not on file  Tobacco Use   Smoking status: Never   Smokeless tobacco: Never  Vaping Use   Vaping Use: Never used  Substance and Sexual Activity   Alcohol use: No    Alcohol/week: 0.0 standard drinks   Drug use: No   Sexual activity: Yes  Other Topics Concern   Not on file  Social History Narrative   Not on file   Social Determinants of Health   Financial Resource Strain: Not on file  Food Insecurity: Not on file  Transportation Needs: Not on file  Physical Activity: Not on file  Stress: Not on file  Social Connections: Not on file  Intimate Partner Violence: Not on file    Family History  Problem Relation Age of Onset   Hypertension Maternal Grandfather    Diabetes Maternal Grandfather    Heart disease Maternal Grandfather    Cancer Maternal Grandmother    Ovarian cysts Mother    Heart disease Paternal Grandmother    High Cholesterol Paternal Grandmother      Review of Systems  Constitutional:  Negative for chills and fever.  HENT:  Negative for congestion, ear discharge, ear pain, hearing loss, sinus pain and sore throat.   Eyes:  Negative for blurred vision and double vision.  Respiratory:  Negative for cough, shortness of breath and wheezing.   Cardiovascular:  Negative for chest pain, palpitations and leg swelling.  Gastrointestinal:  Negative for abdominal pain, blood in stool, constipation, diarrhea, heartburn, melena, nausea and vomiting.  Genitourinary:  Negative for dysuria, flank pain, frequency, hematuria and urgency.       Positive for leaking fluid  Musculoskeletal:  Negative for back pain, joint pain and myalgias.  Skin:  Negative for itching and rash.  Neurological:  Negative for dizziness, tingling, tremors, sensory change, speech change, focal weakness, seizures,  loss of consciousness, weakness and headaches.  Endo/Heme/Allergies:  Negative for environmental allergies. Does not bruise/bleed easily.  Psychiatric/Behavioral:  Negative for depression, hallucinations, memory loss, substance abuse and suicidal ideas. The patient is not nervous/anxious and does not have insomnia.     Physical Exam: BP 132/83 (BP Location: Left Arm)   Pulse (!) 110   Temp 98.3 F (36.8 C) (Oral)   Resp 14   LMP 01/15/2021   Constitutional: Well nourished, well developed female in no acute distress.  HEENT: normal Skin: Warm and dry.  Cardiovascular: Regular rate and rhythm.   Extremity:  trace edema   Respiratory: Clear to auscultation bilateral. Normal respiratory effort Abdomen: FHT present Back: no CVAT Neuro: DTRs 2+, Cranial nerves grossly intact Psych: Alert and Oriented x3. No memory deficits. Normal mood and affect.   Fetal well being: 140 bpm, moderate variability, +accelerations, -decelerations Toco: negative for contractions  Pelvic exam: per RN 4/40/-2 (no change from exam in office 2 days ago   Consults: None  Significant Findings/ Diagnostic Studies: labs:  Results for NICOLEANNE, JOU (MRN [id number]) as of 10/08/2021 20:36  Ref. Range 10/08/2021 19:47  Rom Plus Unknown NEGATIVE    Procedures: NST  Hospital Course: The patient was admitted to Labor and Delivery Triage for observation.   Discharge Condition: good  Disposition: Discharge disposition: 01-Home or Self Care  Diet: Diabetic diet  Discharge Activity: Activity as tolerated  Discharge Instructions     Discharge activity:  No Restrictions   Complete by: As directed    Discharge diet:   Complete by: As directed    Carb modified   LABOR:  When conractions begin, you should start to time them from the beginning of one contraction to the beginning  of the next.  When contractions are 5 - 10 minutes apart or less and have been regular for at least an hour, you should call your  health care provider.   Complete by: As directed    Notify physician for bleeding from the vagina   Complete by: As directed    Notify physician for blurring of vision or spots before the eyes   Complete by: As directed    Notify physician for chills or fever   Complete by: As directed    Notify physician for fainting spells, "black outs" or loss of consciousness   Complete by: As directed    Notify physician for increase in vaginal discharge   Complete by: As directed    Notify physician for leaking of fluid   Complete by: As directed    Notify physician for pain or burning when urinating   Complete by: As directed    Notify physician for pelvic pressure (sudden increase)   Complete by: As directed    Notify physician for severe or continued nausea or vomiting   Complete by: As directed    Notify physician for sudden gushing of fluid from the vagina (  with or without continued leaking)   Complete by: As directed    Notify physician for sudden, constant, or occasional abdominal pain   Complete by: As directed    Notify physician if baby moving less than usual   Complete by: As directed       Allergies as of 10/08/2021   No Known Allergies      Medication List     TAKE these medications    Accu-Chek Guide test strip Generic drug: glucose blood Use as instructed FOUR TIMES daily   Accu-Chek Softclix Lancets lancets Use as instructed FOUR TIMES DAILY   glyBURIDE 2.5 MG tablet Commonly known as: DIABETA Take 1 tablet (2.5 mg total) by mouth at bedtime.   Insulin Pen Needle 32G X 8 MM Misc Use as directed with insulin   Lantus SoloStar 100 UNIT/ML Solostar Pen Generic drug: insulin glargine Inject 25 Units into the skin at bedtime.         Total time spent taking care of this patient: 18 minutes  Signed: Tresea Mall, CNM  10/08/2021, 8:29 PM

## 2021-10-08 NOTE — Discharge Instructions (Signed)

## 2021-10-09 ENCOUNTER — Telehealth: Payer: Self-pay

## 2021-10-09 NOTE — Telephone Encounter (Signed)
Pt calling; would like a call back for definition of possible leaking amniotic fluid.  [phone]  Pt was evaluated in [facility] L&D by JEG before this msg was retrieved.

## 2021-10-13 ENCOUNTER — Other Ambulatory Visit
Admission: RE | Admit: 2021-10-13 | Discharge: 2021-10-13 | Disposition: A | Discharge status: Home / Self Care | Payer: BLUE CROSS/BLUE SHIELD | Source: Ambulatory Visit | Attending: Obstetrics and Gynecology | Admitting: Obstetrics and Gynecology

## 2021-10-13 ENCOUNTER — Other Ambulatory Visit: Payer: Self-pay

## 2021-10-13 DIAGNOSIS — Z20822 Contact with and (suspected) exposure to covid-19: Secondary | ICD-10-CM | POA: Insufficient documentation

## 2021-10-13 DIAGNOSIS — Z01812 Encounter for preprocedural laboratory examination: Secondary | ICD-10-CM | POA: Insufficient documentation

## 2021-10-13 LAB — SARS CORONAVIRUS 2 (TAT 6-24 HRS): SARS Coronavirus 2: NEGATIVE

## 2021-10-15 ENCOUNTER — Inpatient Hospital Stay: Payer: BLUE CROSS/BLUE SHIELD | Admitting: Anesthesiology

## 2021-10-15 ENCOUNTER — Other Ambulatory Visit: Payer: Self-pay

## 2021-10-15 ENCOUNTER — Encounter: Payer: Self-pay | Admitting: Obstetrics & Gynecology

## 2021-10-15 ENCOUNTER — Inpatient Hospital Stay
Admission: EM | Admit: 2021-10-15 | Discharge: 2021-10-16 | DRG: 806 | Disposition: A | Discharge status: Home / Self Care | Payer: BLUE CROSS/BLUE SHIELD | Attending: Obstetrics | Admitting: Obstetrics

## 2021-10-15 ENCOUNTER — Encounter: Payer: Medicaid Other | Admitting: Obstetrics and Gynecology

## 2021-10-15 DIAGNOSIS — D62 Acute posthemorrhagic anemia: Secondary | ICD-10-CM | POA: Diagnosis not present

## 2021-10-15 DIAGNOSIS — Z20822 Contact with and (suspected) exposure to covid-19: Secondary | ICD-10-CM | POA: Diagnosis present

## 2021-10-15 DIAGNOSIS — O134 Gestational [pregnancy-induced] hypertension without significant proteinuria, complicating childbirth: Secondary | ICD-10-CM

## 2021-10-15 DIAGNOSIS — O9903 Anemia complicating the puerperium: Secondary | ICD-10-CM

## 2021-10-15 DIAGNOSIS — Z23 Encounter for immunization: Secondary | ICD-10-CM

## 2021-10-15 DIAGNOSIS — O24414 Gestational diabetes mellitus in pregnancy, insulin controlled: Secondary | ICD-10-CM | POA: Diagnosis not present

## 2021-10-15 DIAGNOSIS — O24424 Gestational diabetes mellitus in childbirth, insulin controlled: Secondary | ICD-10-CM | POA: Diagnosis present

## 2021-10-15 DIAGNOSIS — O9081 Anemia of the puerperium: Secondary | ICD-10-CM | POA: Diagnosis not present

## 2021-10-15 DIAGNOSIS — Z3A38 38 weeks gestation of pregnancy: Secondary | ICD-10-CM

## 2021-10-15 DIAGNOSIS — Z8632 Personal history of gestational diabetes: Secondary | ICD-10-CM

## 2021-10-15 DIAGNOSIS — O99214 Obesity complicating childbirth: Secondary | ICD-10-CM | POA: Diagnosis present

## 2021-10-15 DIAGNOSIS — Z3480 Encounter for supervision of other normal pregnancy, unspecified trimester: Secondary | ICD-10-CM

## 2021-10-15 DIAGNOSIS — Z3490 Encounter for supervision of normal pregnancy, unspecified, unspecified trimester: Secondary | ICD-10-CM

## 2021-10-15 DIAGNOSIS — Z8759 Personal history of other complications of pregnancy, childbirth and the puerperium: Secondary | ICD-10-CM

## 2021-10-15 DIAGNOSIS — Z3A39 39 weeks gestation of pregnancy: Secondary | ICD-10-CM | POA: Diagnosis not present

## 2021-10-15 DIAGNOSIS — O24419 Gestational diabetes mellitus in pregnancy, unspecified control: Secondary | ICD-10-CM | POA: Diagnosis present

## 2021-10-15 LAB — CBC
HCT: 32.7 % — ABNORMAL LOW (ref 36.0–46.0)
Hemoglobin: 10.2 g/dL — ABNORMAL LOW (ref 12.0–15.0)
MCH: 25.8 pg — ABNORMAL LOW (ref 26.0–34.0)
MCHC: 31.2 g/dL (ref 30.0–36.0)
MCV: 82.8 fL (ref 80.0–100.0)
Platelets: 220 10*3/uL (ref 150–400)
RBC: 3.95 MIL/uL (ref 3.87–5.11)
RDW: 14.6 % (ref 11.5–15.5)
WBC: 12.3 10*3/uL — ABNORMAL HIGH (ref 4.0–10.5)
nRBC: 0 % (ref 0.0–0.2)

## 2021-10-15 LAB — TYPE AND SCREEN
ABO/RH(D): O POS
Antibody Screen: NEGATIVE

## 2021-10-15 LAB — GLUCOSE, CAPILLARY
Glucose-Capillary: 129 mg/dL — ABNORMAL HIGH (ref 70–99)
Glucose-Capillary: 72 mg/dL (ref 70–99)
Glucose-Capillary: 101 mg/dL — ABNORMAL HIGH (ref 70–99)
Glucose-Capillary: 74 mg/dL (ref 70–99)
Glucose-Capillary: 107 mg/dL — ABNORMAL HIGH (ref 70–99)

## 2021-10-15 LAB — RPR: RPR Ser Ql: NONREACTIVE

## 2021-10-15 MED ORDER — DIBUCAINE (PERIANAL) 1 % EX OINT: 1.0000 "application " | TOPICAL_OINTMENT | CUTANEOUS | Status: DC | PRN

## 2021-10-15 MED ORDER — LACTATED RINGERS IV SOLN: 500.0000 mL | INTRAVENOUS | Status: DC | PRN

## 2021-10-15 MED ORDER — TETANUS-DIPHTH-ACELL PERTUSSIS 5-2.5-18.5 LF-MCG/0.5 IM SUSY
0.5000 mL | PREFILLED_SYRINGE | Freq: Once | INTRAMUSCULAR | Status: DC
  Filled 2021-10-15: qty 0.5

## 2021-10-15 MED ORDER — INSULIN PUMP
SUBCUTANEOUS | Status: DC
  Filled 2021-10-15: qty 1

## 2021-10-15 MED ORDER — ACETAMINOPHEN 325 MG PO TABS: 650.0000 mg | ORAL_TABLET | ORAL | Status: DC | PRN

## 2021-10-15 MED ORDER — ONDANSETRON HCL 4 MG/2ML IJ SOLN: 4.0000 mg | INTRAMUSCULAR | Status: DC | PRN

## 2021-10-15 MED ORDER — EPHEDRINE 5 MG/ML INJ
10.0000 mg | INTRAVENOUS | Status: DC | PRN
  Filled 2021-10-15: qty 2

## 2021-10-15 MED ORDER — LIDOCAINE HCL (PF) 1 % IJ SOLN
30.0000 mL | INTRAMUSCULAR | Status: DC | PRN
  Filled 2021-10-15: qty 30

## 2021-10-15 MED ORDER — OXYCODONE HCL 5 MG PO TABS: 5.0000 mg | ORAL_TABLET | ORAL | Status: DC | PRN

## 2021-10-15 MED ORDER — OXYTOCIN BOLUS FROM INFUSION
333.0000 mL | Freq: Once | INTRAVENOUS | Status: AC
  Administered 2021-10-15: 333 mL via INTRAVENOUS

## 2021-10-15 MED ORDER — LACTATED RINGERS IV SOLN: INTRAVENOUS | Status: DC

## 2021-10-15 MED ORDER — SIMETHICONE 80 MG PO CHEW: 80.0000 mg | CHEWABLE_TABLET | ORAL | Status: DC | PRN

## 2021-10-15 MED ORDER — OXYCODONE HCL 5 MG PO TABS: 10.0000 mg | ORAL_TABLET | ORAL | Status: DC | PRN

## 2021-10-15 MED ORDER — ONDANSETRON HCL 4 MG PO TABS
4.0000 mg | ORAL_TABLET | ORAL | Status: DC | PRN
  Filled 2021-10-15: qty 1

## 2021-10-15 MED ORDER — TERBUTALINE SULFATE 1 MG/ML IJ SOLN: 0.2500 mg | Freq: Once | INTRAMUSCULAR | Status: DC | PRN

## 2021-10-15 MED ORDER — FENTANYL-BUPIVACAINE-NACL 0.5-0.125-0.9 MG/250ML-% EP SOLN
EPIDURAL | Status: AC
  Filled 2021-10-15: qty 250

## 2021-10-15 MED ORDER — LIDOCAINE HCL (PF) 1 % IJ SOLN
INTRAMUSCULAR | Status: DC | PRN
  Administered 2021-10-15: 3 mL

## 2021-10-15 MED ORDER — WITCH HAZEL-GLYCERIN EX PADS: 1.0000 "application " | MEDICATED_PAD | CUTANEOUS | Status: DC | PRN

## 2021-10-15 MED ORDER — FENTANYL-BUPIVACAINE-NACL 0.5-0.125-0.9 MG/250ML-% EP SOLN
12.0000 mL/h | EPIDURAL | Status: DC | PRN
  Administered 2021-10-15: 12 mL/h via EPIDURAL

## 2021-10-15 MED ORDER — PHENYLEPHRINE 40 MCG/ML (10ML) SYRINGE FOR IV PUSH (FOR BLOOD PRESSURE SUPPORT)
80.0000 ug | PREFILLED_SYRINGE | INTRAVENOUS | Status: DC | PRN
  Filled 2021-10-15: qty 10

## 2021-10-15 MED ORDER — ZOLPIDEM TARTRATE 5 MG PO TABS: 5.0000 mg | ORAL_TABLET | Freq: Every evening | ORAL | Status: DC | PRN

## 2021-10-15 MED ORDER — ONDANSETRON HCL 4 MG/2ML IJ SOLN: 4.0000 mg | Freq: Four times a day (QID) | INTRAMUSCULAR | Status: DC | PRN

## 2021-10-15 MED ORDER — COCONUT OIL OIL
1.0000 "application " | TOPICAL_OIL | Status: DC | PRN
  Filled 2021-10-15 (×2): qty 120

## 2021-10-15 MED ORDER — MISOPROSTOL 25 MCG QUARTER TABLET
25.0000 ug | ORAL_TABLET | ORAL | Status: DC | PRN
  Filled 2021-10-15 (×2): qty 1

## 2021-10-15 MED ORDER — LIDOCAINE-EPINEPHRINE (PF) 1.5 %-1:200000 IJ SOLN
INTRAMUSCULAR | Status: DC | PRN
  Administered 2021-10-15: 3 mL via PERINEURAL

## 2021-10-15 MED ORDER — BENZOCAINE-MENTHOL 20-0.5 % EX AERO: 1.0000 "application " | INHALATION_SPRAY | CUTANEOUS | Status: DC | PRN

## 2021-10-15 MED ORDER — BUTORPHANOL TARTRATE 1 MG/ML IJ SOLN: 1.0000 mg | INTRAMUSCULAR | Status: DC | PRN

## 2021-10-15 MED ORDER — DOCUSATE SODIUM 100 MG PO CAPS
100.0000 mg | ORAL_CAPSULE | Freq: Two times a day (BID) | ORAL | Status: DC
  Filled 2021-10-15: qty 1

## 2021-10-15 MED ORDER — IBUPROFEN 600 MG PO TABS
600.0000 mg | ORAL_TABLET | Freq: Four times a day (QID) | ORAL | Status: DC
  Filled 2021-10-15: qty 1

## 2021-10-15 MED ORDER — PRENATAL MULTIVITAMIN CH
1.0000 | ORAL_TABLET | Freq: Every day | ORAL | Status: DC
  Filled 2021-10-15: qty 1

## 2021-10-15 MED ORDER — DIPHENHYDRAMINE HCL 25 MG PO CAPS: 25.0000 mg | ORAL_CAPSULE | Freq: Four times a day (QID) | ORAL | Status: DC | PRN

## 2021-10-15 MED ORDER — BUPIVACAINE HCL (PF) 0.25 % IJ SOLN
INTRAMUSCULAR | Status: DC | PRN
  Administered 2021-10-15: 2 mL via EPIDURAL
  Administered 2021-10-15: 3 mL via EPIDURAL
  Administered 2021-10-15: 5 mL via EPIDURAL

## 2021-10-15 MED ORDER — LACTATED RINGERS IV SOLN
500.0000 mL | Freq: Once | INTRAVENOUS | Status: AC
  Administered 2021-10-15: 500 mL via INTRAVENOUS

## 2021-10-15 MED ORDER — DIPHENHYDRAMINE HCL 50 MG/ML IJ SOLN: 12.5000 mg | INTRAMUSCULAR | Status: DC | PRN

## 2021-10-15 MED ORDER — OXYTOCIN-SODIUM CHLORIDE 30-0.9 UT/500ML-% IV SOLN
1.0000 m[IU]/min | INTRAVENOUS | Status: DC
  Administered 2021-10-15: 2 m[IU]/min via INTRAVENOUS

## 2021-10-15 MED ORDER — OXYTOCIN-SODIUM CHLORIDE 30-0.9 UT/500ML-% IV SOLN
2.5000 [IU]/h | INTRAVENOUS | Status: DC
  Filled 2021-10-15 (×2): qty 500

## 2021-10-15 NOTE — Progress Notes (Signed)
Gina Gomez is a 25 y.o. G2P1001 at [redacted]w[redacted]d by ultrasound admitted for induction of labor due to Gestational diabetes.She has been on pitocin for several hours, with one break in the middle because she wanted to eat.  Subjective:  She is beginning to feel uncomfortable, and has seen some bloody show.   Objective: BP 135/78 (BP Location: Left Arm)   Pulse 87   Temp 98.8 F (37.1 C) (Axillary)   Resp 16   Ht 5\' 2"  (1.575 m)   Wt 110.7 kg   LMP 01/15/2021   BMI 44.63 kg/m  No intake/output data recorded. No intake/output data recorded.  FHT:  FHR: 150 baseline bpm, variability: moderate,  accelerations:  Present,  decelerations:  Present some brief variables noted with contractions. UC:   regular, every 2-3 minutes SVE:   Dilation: 5.5 Effacement (%): 70 Station: -3 Exam by:: [email or web address], CNM  Labs: Lab Results  Component Value Date   WBC 12.3 (H) 10/15/2021   HGB 10.2 (L) 10/15/2021   HCT 32.7 (L) 10/15/2021   MCV 82.8 10/15/2021   PLT 220 10/15/2021    Assessment / Plan: Induction of labor due to gestational hypertension,  progressing well on pitocin  Labor:  AROM performed- clear fluid seen at 1307  Fetal Wellbeing:  Category II Pain Control:  Labor support without medications I/D:  n/a Anticipated MOD:  NSVD  10/17/2021 10/15/2021, 1:11 PM

## 2021-10-15 NOTE — Anesthesia Preprocedure Evaluation (Signed)
Anesthesia Evaluation  Patient identified by MRN, date of birth, ID band Patient awake    History of Anesthesia Complications Negative for: history of anesthetic complications  Airway Mallampati: II  TM Distance: >3 FB Neck ROM: Full    Dental no notable dental hx. (+) Teeth Intact   Pulmonary neg pulmonary ROS,           Cardiovascular Exercise Tolerance: Good negative cardio ROS   Rate:Normal     Neuro/Psych negative neurological ROS  negative psych ROS   GI/Hepatic GERD  ,  Endo/Other  diabetes, Gestational  Renal/GU   negative genitourinary   Musculoskeletal negative musculoskeletal ROS (+)   Abdominal   Peds negative pediatric ROS (+)  Hematology negative hematology ROS (+)   Anesthesia Other Findings   Reproductive/Obstetrics (+) Pregnancy                             Anesthesia Physical Anesthesia Plan  ASA: 2  Anesthesia Plan: Epidural   Post-op Pain Management:    Induction:   PONV Risk Score and Plan:   Airway Management Planned:   Additional Equipment:   Intra-op Plan:   Post-operative Plan:   Informed Consent: I have reviewed the patients History and Physical, chart, labs and discussed the procedure including the risks, benefits and alternatives for the proposed anesthesia with the patient or authorized representative who has indicated his/her understanding and acceptance.       Plan Discussed with: Anesthesiologist  Anesthesia Plan Comments:         Anesthesia Quick Evaluation

## 2021-10-15 NOTE — H&P (Signed)
Gina Gomez is a 25 y.o. female presenting for induction of labor at [redacted] weeks gestation due to diagnosis of gestational diabetes. Her husband is with her and supportive.  OB History     Gravida  2   Para  1   Term  1   Preterm      AB      Living  1      SAB      IAB      Ectopic      Multiple  0   Live Births             Past Medical History:  Diagnosis Date   Gestational diabetes    Medical history non-contributory    Obesity (BMI 35.0-39.9 without comorbidity)    Past Surgical History:  Procedure Laterality Date   NO PAST SURGERIES     Family History: family history includes Cancer in her maternal grandmother; Diabetes in her maternal grandfather; Heart disease in her maternal grandfather and paternal grandmother; High Cholesterol in her paternal grandmother; Hypertension in her maternal grandfather; Ovarian cysts in her mother. Social History:  reports that she has never smoked. She has never used smokeless tobacco. She reports that she does not drink alcohol and does not use drugs.     Maternal Diabetes: Yes:  Diabetes Type:  Insulin/Medication controlled Genetic Screening: Normal Maternal Ultrasounds/Referrals: Normal Fetal Ultrasounds or other Referrals:  None Maternal Substance Abuse:  No Significant Maternal Medications:  Meds include: Other: Insulin- lantus at night; glyburide Significant Maternal Lab Results:  Group B Strep negative Other Comments:  None  Review of Systems  Constitutional: Negative.   HENT: Negative.    Eyes: Negative.   Respiratory: Negative.    Cardiovascular: Negative.   Gastrointestinal:        Gravid abdomen.  Endocrine: Negative.        Gestationally diabetic  Musculoskeletal: Negative.   Allergic/Immunologic: Negative.   Neurological: Negative.   Hematological: Negative.   Psychiatric/Behavioral: Negative.    History Dilation: 4.5 Effacement (%): 50 Station: -3 Exam by:: Eunice Blase, CNM Blood pressure 133/84,  pulse 96, temperature 98.2 F (36.8 C), temperature source Oral, resp. rate 18, height 5\' 2"  (1.575 m), weight 110.7 kg, last menstrual period 01/15/2021. Bishop score -7 Maternal Exam:  Uterine Assessment: Contraction strength is mild.  Contraction frequency is regular.  Abdomen: Fetal presentation: vertex Introitus: Normal vulva. Normal vagina.  Pelvis: adequate for delivery.   Cervix: Cervix evaluated by digital exam.    Physical Exam Constitutional:      Appearance: Normal appearance. She is obese.  HENT:     Head: Normocephalic and atraumatic.     Nose: Nose normal.  Cardiovascular:     Rate and Rhythm: Normal rate and regular rhythm.     Pulses: Normal pulses.     Heart sounds: Normal heart sounds.  Pulmonary:     Effort: Pulmonary effort is normal.     Breath sounds: Normal breath sounds.  Abdominal:     Palpations: Abdomen is soft.     Comments: Gravid abdomen  Adipose   Genitourinary:    General: Normal vulva.     Rectum: Normal.  Musculoskeletal:        General: Normal range of motion.     Cervical back: Normal range of motion and neck supple.  Skin:    General: Skin is warm and dry.  Neurological:     General: No focal deficit present.     Mental  Status: She is alert and oriented to person, place, and time.  Psychiatric:        Mood and Affect: Mood normal.        Behavior: Behavior normal.    Prenatal labs: ABO, Rh: --/--/O POS (11/16 0532) Antibody: NEG (11/16 0532) Rubella: 1.00 (06/06 1004) RPR: Non Reactive (08/22 0834)  HBsAg: Negative (06/06 1004)  HIV: Non Reactive (08/22 0834)  GBS: Negative/-- (10/31 1021)   Assessment/Plan: 25 year old Gravida 2 para 1 at [redacted] weeks gestation for scheduled induction of labor Gestational diabetes, insulin controlled, with additional oral glyburide Reactive FHTS Favorable cervix- Bishop score 7  PLAN: Admitted Pitocin augmentation per protocol Glucose checks q 4 hours Will use Endo tool if glucose levels  warrant. EFM- continuous Anticipate epidural anesthesia.  Mirna Mires, CNM  10/15/2021 9:20 AM     Mirna Mires 10/15/2021, 9:01 AM

## 2021-10-15 NOTE — Progress Notes (Signed)
Inpatient Diabetes Program Recommendations  ADA Standards of Care 2022 Diabetes in Pregnancy Target Glucose Ranges:  Fasting: 60 - 90 mg/dL Preprandial: 60 - 673 mg/dL 1 hr postprandial: Less than 140mg /dL (from first bite of meal) 2 hr postprandial: Less than 120 mg/dL (from first bit of meal)    Lab Results  Component Value Date   GLUCAP 101 (H) 10/15/2021    Review of Glycemic Control  Diabetes history: Gestational Diabetes Outpatient Diabetes medications: Lantus 25 units qhs, Glyburide 2.5 Current orders for Inpatient glycemic control: CBGs q 4 hrs.  Inpatient Diabetes Program Recommendations:   Spoke with CNM 10/17/2021 regarding diabetes management. Agree with plan to start IV insulin per endotool if CBGs >120. After delivery, please consider: -Novolog correction 0-9 units q 4 hrs. X 24 hrs. Then tid + hs 0-5 units. Will follow during hospitalization.  Thank you, Paula Compton. Thania Woodlief, RN, MSN, CDE  Diabetes Coordinator Inpatient Glycemic Control Team Team Pager [phone] (8am-5pm) 10/15/2021 1:49 PM

## 2021-10-15 NOTE — Discharge Summary (Signed)
j    Postpartum Discharge Summary  Date of Service updated11/15/2022     Patient Name: Gina Gomez DOB: 05-07-1996 MRN: [id number]  Date of admission: 10/15/2021 Delivery date:10/15/2021  Delivering provider: Imagene Riches  Date of discharge: 10/16/2021  Admitting diagnosis: Gestational diabetes mellitus (GDM) affecting second pregnancy [O24.419] Intrauterine pregnancy: [redacted]w[redacted]d    Secondary diagnosis:  Active Problems:   Gestational diabetes mellitus (GDM) affecting second pregnancy   Spontaneous vaginal delivery   Encounter for induction of labor   Postpartum care following vaginal delivery  Additional problems: none    Discharge diagnosis: Term Pregnancy Delivered and GDM A2                                              Post partum procedures: plans postpartum prcedure Augmentation: AROM and Pitocin Complications: None  Hospital course: Induction of Labor With Vaginal Delivery   25y.o. yo G2P1001 at 376w0das admitted to the hospital 10/15/2021 for induction of labor.  Indication for induction: A2 DM.  Patient had an uncomplicated labor course as follows: Membrane Rupture Time/Date: 1:07 PM ,10/15/2021   Delivery Method:Vaginal, Spontaneous  Episiotomy: None  Lacerations:  1st degree  Details of delivery can be found in separate delivery note.  Patient had a routine postpartum course. Patient is discharged home 10/16/21.  Newborn Data: Birth date:10/15/2021  Birth time:3:35 PM  Gender:Female  Living status:Living  Apgars:8 ,9  Weight:3210 g   Magnesium Sulfate received: No BMZ received: No Rhophylac:No MMR:No T-DaP:Given prenatally Flu: No Transfusion:No  Physical exam  Vitals:   10/16/21 0011 10/16/21 0429 10/16/21 0740 10/16/21 1542  BP: 122/74 (!) 108/58 115/76 117/76  Pulse: (!) 105 (!) 106 95 98  Resp: _0 Temp: 98.4 F (36.9 C) 98.4 F (36.9 C) 98.2 F (36.8 C) 98.2 F (36.8 C)  TempSrc: Oral Oral Oral Oral  SpO2: 100% 100% 99%    Weight:      Height:       General: alert, cooperative, and no distress Lochia: appropriate Uterine Fundus: firm Incision: N/A DVT Evaluation: No evidence of DVT seen on physical exam. Labs: Lab Results  Component Value Date   WBC 12.5 (H) 10/16/2021   HGB 9.2 (L) 10/16/2021   HCT 29.1 (L) 10/16/2021   MCV 82.4 10/16/2021   PLT 196 10/16/2021   CMP Latest Ref Rng & Units 02/14/2021  Glucose 70 - 99 mg/dL 92  BUN 6 - 20 mg/dL 12  Creatinine 0.44 - 1.00 mg/dL 0.61  Sodium 135 - 145 mmol/L 136  Potassium 3.5 - 5.1 mmol/L 4.0  Chloride 98 - 111 mmol/L 105  CO2 22 - 32 mmol/L 23  Calcium 8.9 - 10.3 mg/dL 9.0  Total Protein 6.5 - 8.1 g/dL 7.6  Total Bilirubin 0.3 - 1.2 mg/dL 0.5  Alkaline Phos 38 - 126 U/L 79  AST 15 - 41 U/L 15  ALT 0 - 44 U/L 15   Edinburgh Score: Edinburgh Postnatal Depression Scale Screening Tool 10/16/2021  I have been able to laugh and see the funny side of things. 0  I have looked forward with enjoyment to things. 0  I have blamed myself unnecessarily when things went wrong. 0  I have been anxious or worried for no good reason. 0  I have felt scared or panicky for no good reason. 0  Things have been getting on top of me. 0  I have been so unhappy that I have had difficulty sleeping. 0  I have felt sad or miserable. 0  I have been so unhappy that I have been crying. 0  The thought of harming myself has occurred to me. 0  Edinburgh Postnatal Depression Scale Total 0      After visit meds:  Allergies as of 10/16/2021   No Known Allergies      Medication List     STOP taking these medications    Accu-Chek Guide test strip Generic drug: glucose blood   Accu-Chek Softclix Lancets lancets   glyBURIDE 2.5 MG tablet Commonly known as: DIABETA   Lantus SoloStar 100 UNIT/ML Solostar Pen Generic drug: insulin glargine       TAKE these medications    Insulin Pen Needle 32G X 8 MM Misc Use as directed with insulin                Discharge Care Instructions  (From admission, onward)           Start     Ordered   10/16/21 0000  Discharge wound care:       Comments: [place] incision gently with soap and water.  Call office with any drainage, redness, or firmness of the incision.   10/16/21 1836             Discharge home in stable condition Infant Feeding: Breast Infant Disposition:home with mother Discharge instruction: per After Visit Summary and Postpartum booklet. Activity: Advance as tolerated. Pelvic rest for 6 weeks.  Diet: carb modified diet Anticipated Birth Control: Plans Interval BTL Postpartum Appointment:4 weeks Additional Postpartum F/U:  will see an MD provider at 4 weeks PP to plan for her BTL Future Appointments:No future appointments. Follow up Visit:  [place] Follow up in 4 week(s).   Specialty: Obstetrics and Gynecology Why: Please make an appointment to see one of the physicians at [facility] at 4 weeks post delivery for an exam and to discuss your tubal ligation. Contact information: [address] [place] [address] [phone]                    10/16/2021 Homero Fellers, MD

## 2021-10-15 NOTE — Anesthesia Procedure Notes (Signed)
Epidural Patient location during procedure: OB Start time: 10/15/2021 2:12 PM  Staffing Performed: resident/CRNA   Preanesthetic Checklist Completed: patient identified, IV checked, site marked, risks and benefits discussed, surgical consent, monitors and equipment checked, pre-op evaluation and timeout performed  Epidural Patient position: sitting Prep: DuraPrep Patient monitoring: heart rate, continuous pulse ox and blood pressure Approach: midline Location: L3-L4 Injection technique: LOR saline  Needle:  Needle type: Tuohy  Needle gauge: 17 G Needle length: 9 cm Needle insertion depth: 6 cm Catheter type: closed end flexible Catheter size: 19 Gauge Catheter at skin depth: 11 cm Test dose: negative and 1.5% lidocaine with Epi 1:200 K  Assessment Events: blood not aspirated, injection not painful, no injection resistance, no paresthesia and negative IV test  Additional Notes Attemptx1, pt tolerated well.  Pain score 8 pre decreasing to 5 post.Reason for block:at surgeon's request

## 2021-10-16 LAB — CBC
HCT: 29.1 % — ABNORMAL LOW (ref 36.0–46.0)
Hemoglobin: 9.2 g/dL — ABNORMAL LOW (ref 12.0–15.0)
MCH: 26.1 pg (ref 26.0–34.0)
MCHC: 31.6 g/dL (ref 30.0–36.0)
MCV: 82.4 fL (ref 80.0–100.0)
Platelets: 196 10*3/uL (ref 150–400)
RBC: 3.53 MIL/uL — ABNORMAL LOW (ref 3.87–5.11)
RDW: 14.8 % (ref 11.5–15.5)
WBC: 12.5 10*3/uL — ABNORMAL HIGH (ref 4.0–10.5)
nRBC: 0 % (ref 0.0–0.2)

## 2021-10-16 LAB — GLUCOSE, CAPILLARY
Glucose-Capillary: 130 mg/dL — ABNORMAL HIGH (ref 70–99)
Glucose-Capillary: 95 mg/dL (ref 70–99)
Glucose-Capillary: 97 mg/dL (ref 70–99)

## 2021-10-16 MED ORDER — VARICELLA VIRUS VACCINE LIVE 1350 PFU/0.5ML IJ SUSR
0.5000 mL | Freq: Once | INTRAMUSCULAR | Status: AC
  Administered 2021-10-16: 0.5 mL via SUBCUTANEOUS
  Filled 2021-10-16: qty 0.5

## 2021-10-16 NOTE — Discharge Instructions (Signed)

## 2021-10-16 NOTE — Progress Notes (Signed)
Inpatient Diabetes Program Recommendations  AACE/ADA: New Consensus Statement on Inpatient Glycemic Control (2015)  Target Ranges:  Prepandial:   less than 140 mg/dL      Peak postprandial:   less than 180 mg/dL (1-2 hours)      Critically ill patients:  140 - 180 mg/dL    Latest Reference Range & Units 10/16/21 00:40 10/16/21 04:30  Glucose-Capillary 70 - 99 mg/dL 007 (H) 97     History: GDM  Home DM Meds: Lantus 25 units qhs     Glyburide 2.5 mg qhs  Current Orders: Insulin Pump Q4 hours     Unsure why insulin Pump orders were placed last PM??  Pt was using Lantus and Glyburide during pregnancy to control GDM  CBG was 97 at 4:30am today  MD- Please discontinue the Insulin Pump orders  Please change CBG checks to TID before meals and at bedtime    --Will follow patient during hospitalization--  Ambrose Finland RN, MSN, CDE Diabetes Coordinator Inpatient Glycemic Control Team Team Pager: [phone] (8a-5p)

## 2021-10-16 NOTE — Lactation Note (Signed)
This note was copied from a baby's chart. Lactation Consultation Note  Patient Name: Gina Gomez Date: 10/16/2021 Reason for consult: Follow-up assessment;Mother's request;Term Age:26 hours  LC present for feeding assistance. Baby just received a bath and is skin to skin with mom. LC notes bilateral flat nipples that do not evert with stimulation/compression/hand expression. Attempts made to sandwich breast tissue to achieve a latch were unsuccessful. Size 46mm NS applied with baby in cross-cradle; multiple attempts made to achieve a deep latch, baby struggles to accommodate the size nipple shield, shallow latch multiple times. LC transitioned baby into football hold, baby continues to struggle to open mouth wide enough, some improvement but still discomfort. Baby did transfer colostrum (evident by what was left in the nipple shield).  Discussed tips/strategies for helping baby improve wider mouth/deeper latch. -Continue breastfeeding efforts at the breast with nipple shield for each feeding, mindful to catch early cues -Pump post feedings to bring in and protect milk supply as baby grows and learns to accommodate shield size. -When supplement is given ensure that baby's lips are flanged and as close to the yellow on the nipple.  Feeding attempt ended after 10 minutes, baby brought back skin to skin on mom's chest with warm blankets.  Maternal Data Does the patient have breastfeeding experience prior to this delivery?: Yes How long did the patient breastfeed?: 2 months  Feeding Mother's Current Feeding Choice: Breast Milk  LATCH Score Latch: Repeated attempts needed to sustain latch, nipple held in mouth throughout feeding, stimulation needed to elicit sucking reflex.  Audible Swallowing: A few with stimulation  Type of Nipple: Flat (nipple is flat; nipple shield used)  Comfort (Breast/Nipple): Filling, red/small blisters or bruises, mild/mod discomfort (mom has complaints  of nipple pain throughout feeding)  Hold (Positioning): Assistance needed to correctly position infant at breast and maintain latch.  LATCH Score: 5   Lactation Tools Discussed/Used Tools: Nipple Shields Nipple shield size: 24  Interventions Interventions: Breast feeding basics reviewed;Assisted with latch;Adjust position;Support pillows;Position options;Education (nipple shield use)  Discharge Pump: Personal  Consult Status Consult Status: Follow-up Date: 10/16/21 Follow-up type: Call as needed    Danford Bad 10/16/2021, 10:05 AM

## 2021-10-16 NOTE — Lactation Note (Signed)
This note was copied from a baby's chart. Lactation Consultation Note  Patient Name: Gina Gomez [id number] Date: 10/16/2021 Reason for consult: Initial assessment;Term;Other (Comment) (nipple shield) Age:26 hours  Initial lactation visit. Mom is P2, SVD 17 hours ago. Mom prefers to breastfeed however reports nipple tenderness/soreness and pain with feedings and a long period between feedings. Mom has been given a nipple shield, mom reports being told with her first child that she has flat nipples. (LC did not observe her nipples at this time). LC and mom discussed newborn feeding patterns and behaviors, and offered assistance with next feeding attempt to determine cause of soreness/pain with feedings. We also discussed alternate options for providing breastmilk to her baby. Mom did express concern re: white spot on roof of baby's mouth thinking that it may be the start of thrush, LC did not see a white spot, white was evident on the tongue but this more than likely was from the attempt to feed supplement. Parents open to support and assistance with next feeding attempt. Whiteboard updated with LC name/number; encouraged to call when baby is ready.  Maternal Data Does the patient have breastfeeding experience prior to this delivery?: Yes How long did the patient breastfeed?: 2 months  Feeding Mother's Current Feeding Choice: Breast Milk  LATCH Score                    Lactation Tools Discussed/Used Tools: Bottle;Nipple Shields  Interventions Interventions: Breast feeding basics reviewed;Education  Discharge    Consult Status Consult Status: Follow-up Date: 10/16/21 Follow-up type: In-patient    Danford Bad 10/16/2021, 9:30 AM

## 2021-10-16 NOTE — Progress Notes (Signed)
Subjective:  She is doing well postpartum day 1: she is tolerating regular diet, her pain is controlled with PO medications. She is ambulating and voiding without difficulty. She reports some issues with latch and has support from [facility].   Objective:  Vital signs in last 24 hours: Temp:  [97.8 F (36.6 C)-98.8 F (37.1 C)] 98.2 F (36.8 C) (11/17 0740) Pulse Rate:  [85-114] 95 (11/17 0740) Resp:  [18-20] 18 (11/17 0740) BP: (108-140)/(58-81) 115/76 (11/17 0740) SpO2:  [99 %-100 %] 99 % (11/17 0740)    General: NAD Pulmonary: no increased work of breathing Abdomen: non-distended, non-tender, fundus firm at level of umbilicus Extremities: no edema, no erythema, no tenderness  Results for orders placed or performed during the hospital encounter of 10/15/21 (from the past 72 hour(s))  CBC     Status: Abnormal   Collection Time: 10/15/21  5:32 AM  Result Value Ref Range   WBC 12.3 (H) 4.0 - 10.5 K/uL   RBC 3.95 3.87 - 5.11 MIL/uL   Hemoglobin 10.2 (L) 12.0 - 15.0 g/dL   HCT 14.4 (L) 81.8 - 56.3 %   MCV 82.8 80.0 - 100.0 fL   MCH 25.8 (L) 26.0 - 34.0 pg   MCHC 31.2 30.0 - 36.0 g/dL   RDW 14.9 70.2 - 63.7 %   Platelets 220 150 - 400 K/uL   nRBC 0.0 0.0 - 0.2 %    Comment: Performed at [facility], [address] [place], [place] [address]  Type and screen [facility]     Status: None   Collection Time: 10/15/21  5:32 AM  Result Value Ref Range   ABO/RH(D) O POS    Antibody Screen NEG    Sample Expiration      10/18/2021,2359 Performed at [facility], [address] [place], [place] [address]   RPR     Status: None   Collection Time: 10/15/21  5:32 AM  Result Value Ref Range   RPR Ser Ql NON REACTIVE NON REACTIVE    Comment: Performed at [facility], [address] [place], [place] [address]  Glucose, capillary     Status: Abnormal   Collection Time: 10/15/21  5:42 AM  Result Value Ref Range   Glucose-Capillary 129 (H)  70 - 99 mg/dL    Comment: Glucose reference range applies only to samples taken after fasting for at least 8 hours.  Glucose, capillary     Status: None   Collection Time: 10/15/21  8:49 AM  Result Value Ref Range   Glucose-Capillary 72 70 - 99 mg/dL    Comment: Glucose reference range applies only to samples taken after fasting for at least 8 hours.  Glucose, capillary     Status: Abnormal   Collection Time: 10/15/21 11:04 AM  Result Value Ref Range   Glucose-Capillary 101 (H) 70 - 99 mg/dL    Comment: Glucose reference range applies only to samples taken after fasting for at least 8 hours.  Glucose, capillary     Status: None   Collection Time: 10/15/21  5:20 PM  Result Value Ref Range   Glucose-Capillary 74 70 - 99 mg/dL    Comment: Glucose reference range applies only to samples taken after fasting for at least 8 hours.  Glucose, capillary     Status: Abnormal   Collection Time: 10/15/21  9:19 PM  Result Value Ref Range   Glucose-Capillary 107 (H) 70 - 99 mg/dL    Comment:  Glucose reference range applies only to samples taken after fasting for at least 8 hours.  Glucose, capillary     Status: Abnormal   Collection Time: 10/16/21 12:40 AM  Result Value Ref Range   Glucose-Capillary 130 (H) 70 - 99 mg/dL    Comment: Glucose reference range applies only to samples taken after fasting for at least 8 hours.  Glucose, capillary     Status: None   Collection Time: 10/16/21  4:30 AM  Result Value Ref Range   Glucose-Capillary 97 70 - 99 mg/dL    Comment: Glucose reference range applies only to samples taken after fasting for at least 8 hours.  CBC     Status: Abnormal   Collection Time: 10/16/21  5:36 AM  Result Value Ref Range   WBC 12.5 (H) 4.0 - 10.5 K/uL   RBC 3.53 (L) 3.87 - 5.11 MIL/uL   Hemoglobin 9.2 (L) 12.0 - 15.0 g/dL   HCT 67.7 (L) 03.4 - 03.5 %   MCV 82.4 80.0 - 100.0 fL   MCH 26.1 26.0 - 34.0 pg   MCHC 31.6 30.0 - 36.0 g/dL   RDW 24.8 18.5 - 90.9 %   Platelets 196  150 - 400 K/uL   nRBC 0.0 0.0 - 0.2 %    Comment: Performed at [facility], [address] [place], [place] [address]  Glucose, capillary     Status: None   Collection Time: 10/16/21  7:44 AM  Result Value Ref Range   Glucose-Capillary 95 70 - 99 mg/dL    Comment: Glucose reference range applies only to samples taken after fasting for at least 8 hours.    Assessment:   25 y.o. G2P1001 postpartum day # 1, lactating  Plan:    1) Acute blood loss anemia - hemodynamically stable and asymptomatic - po ferrous sulfate  2) Blood Type --/--/O POS (11/16 0532) / Rubella 1.00 (06/06 1004) / Varicella Non-Immune  3) TDAP status up to date  4) Feeding plan  breast and formula  5)  Education given regarding options for contraception, as well as compatibility with breast feeding if applicable.  Patient plans on  interval tubal  for contraception.  6) Disposition: continue current care   Tresea Mall, CNM [facility] 10/16/2021, 11:56 AM

## 2021-10-16 NOTE — Discharge Summary (Signed)
Discharge and follow up reviewed with pt. No prescriptions given or called in. Pt verbalizes understanding of all dc instructions.dc'd to home via wc with infant in arms. Placed in backseat facing rear by family member.

## 2021-10-17 NOTE — Anesthesia Postprocedure Evaluation (Signed)
Anesthesia Post Note  Patient: Gina Gomez  Procedure(s) Performed: AN AD HOC LABOR EPIDURAL  Patient location during evaluation: Mother Baby Anesthesia Type: Epidural Level of consciousness: awake and alert Pain management: pain level controlled Vital Signs Assessment: post-procedure vital signs reviewed and stable Respiratory status: spontaneous breathing, nonlabored ventilation and respiratory function stable Cardiovascular status: stable Postop Assessment: no headache, no backache, patient able to bend at knees and able to ambulate Anesthetic complications: no   No notable events documented.   Last Vitals: There were no vitals filed for this visit.  Last Pain: There were no vitals filed for this visit.               Gina Gomez

## 2021-11-06 ENCOUNTER — Ambulatory Visit: Payer: Medicaid Other | Admitting: Obstetrics and Gynecology

## 2021-11-10 ENCOUNTER — Other Ambulatory Visit: Payer: Self-pay

## 2021-11-10 ENCOUNTER — Encounter: Payer: Self-pay | Admitting: Obstetrics and Gynecology

## 2021-11-10 ENCOUNTER — Ambulatory Visit (INDEPENDENT_AMBULATORY_CARE_PROVIDER_SITE_OTHER): Payer: Medicaid Other | Admitting: Obstetrics and Gynecology

## 2021-11-10 DIAGNOSIS — Z3009 Encounter for other general counseling and advice on contraception: Secondary | ICD-10-CM

## 2021-11-10 NOTE — Progress Notes (Signed)
Postpartum Visit  Chief Complaint:  Chief Complaint  Patient presents with   Postpartum Care    History of Present Illness: Patient is a 25 y.o. G2P1002 presents for postpartum visit.  Date of delivery: 10/15/2021 Type of delivery: Vaginal Laceration: 1st degree   Breast Feeding:  yes Lochia: spotting Post partum depression/anxiety noted:  no  Date of last PAP: 01/23/2021 NIL   Any problems since the delivery:  No  She reports She has been feeling well. She would like to have a salpingectomy for sterilization  Newborn Details:  SINGLETON :  1. Baby Gender:female.  Infant Status: Infant doing well at home with mother.   Review of Systems: Review of Systems  Constitutional:  Negative for chills, fever, malaise/fatigue and weight loss.  HENT:  Negative for congestion, hearing loss and sinus pain.   Eyes:  Negative for blurred vision and double vision.  Respiratory:  Negative for cough, sputum production, shortness of breath and wheezing.   Cardiovascular:  Negative for chest pain, palpitations, orthopnea and leg swelling.  Gastrointestinal:  Negative for abdominal pain, constipation, diarrhea, nausea and vomiting.  Genitourinary:  Negative for dysuria, flank pain, frequency, hematuria and urgency.  Musculoskeletal:  Negative for back pain, falls and joint pain.  Skin:  Negative for itching and rash.  Neurological:  Negative for dizziness and headaches.  Psychiatric/Behavioral:  Negative for depression, substance abuse and suicidal ideas. The patient is not nervous/anxious.    Past Medical History:  Past Medical History:  Diagnosis Date   Gestational diabetes    Medical history non-contributory    Obesity (BMI 35.0-39.9 without comorbidity)     Past Surgical History:  Past Surgical History:  Procedure Laterality Date   NO PAST SURGERIES      Family History:  Family History  Problem Relation Age of Onset   Hypertension Maternal Grandfather    Diabetes  Maternal Grandfather    Heart disease Maternal Grandfather    Cancer Maternal Grandmother    Ovarian cysts Mother    Heart disease Paternal Grandmother    High Cholesterol Paternal Grandmother     Social History:  Social History   Socioeconomic History   Marital status: Married    Spouse name: Legrand Como   Number of children: 2   Years of education: Not on file   Highest education level: Not on file  Occupational History   Not on file  Tobacco Use   Smoking status: Never   Smokeless tobacco: Never  Vaping Use   Vaping Use: Never used  Substance and Sexual Activity   Alcohol use: No    Alcohol/week: 0.0 standard drinks   Drug use: No   Sexual activity: Yes    Birth control/protection: Surgical  Other Topics Concern   Not on file  Social History Narrative   Not on file   Social Determinants of Health   Financial Resource Strain: Not on file  Food Insecurity: Not on file  Transportation Needs: Not on file  Physical Activity: Not on file  Stress: Not on file  Social Connections: Not on file  Intimate Partner Violence: Not on file    Allergies:  No Known Allergies  Medications: Prior to Admission medications   Medication Sig Start Date End Date Taking? Authorizing Provider  Insulin Pen Needle 32G X 8 MM MISC Use as directed with insulin Patient not taking: Reported on 11/10/2021 08/20/21   Will Bonnet, MD    Physical Exam Vitals:  Vitals:   11/10/21 1145  BP: 110/70    Physical Exam Constitutional:      Appearance: She is well-developed.  Genitourinary:     Genitourinary Comments: External: Normal appearing vulva. No lesions noted.  Well healed laceration.   HENT:     Head: Normocephalic and atraumatic.  Neck:     Thyroid: No thyromegaly.  Cardiovascular:     Rate and Rhythm: Normal rate and regular rhythm.     Heart sounds: Normal heart sounds.  Pulmonary:     Effort: Pulmonary effort is normal.     Breath sounds: Normal breath sounds.   Abdominal:     General: Bowel sounds are normal. There is no distension.     Palpations: Abdomen is soft. There is no mass.  Musculoskeletal:     Cervical back: Neck supple.  Neurological:     Mental Status: She is alert and oriented to person, place, and time.  Skin:    General: Skin is warm and dry.  Psychiatric:        Behavior: Behavior normal.        Thought Content: Thought content normal.        Judgment: Judgment normal.  Vitals reviewed.    Assessment: 25 y.o. G2P1002 presenting for 6 week postpartum visit  Plan: Problem List Items Addressed This Visit   None Visit Diagnoses     Postpartum care and examination    -  Primary   Sterilization consult           1) Contraception-  desires sterilization by salpingectomy  2)  Pap: normal  3) Desires sterilization- note sent to surgical scheduler.     - Follow up for sterilization   Adelene Idler MD, [facility], [facility] 11/10/2021 12:26 PM

## 2021-11-10 NOTE — Patient Instructions (Signed)
Salpingectomy Salpingectomy, also called tubectomy, is the surgical removal of one of the fallopian tubes. The fallopian tubes allow eggs to travel from the ovaries to the uterus. Removing one fallopian tube does not prevent pregnancy. It also does not cause problems with your menstrual periods. You may need this procedure if you: Have an ectopic pregnancy. This is when a fertilized egg attaches to the fallopian tube instead of the uterus. An ectopic pregnancy can cause the tube to burst or tear (rupture). Have an infected fallopian tube. Have cancer of the fallopian tube or nearby organs. Have had an ovary removed due to a cyst or tumor. Have had your uterus removed. Are at high risk for ovarian cancer. There are three different methods that can be used for a salpingectomy: An open method in which one large incision is made in your abdomen. A laparoscopic method in which a thin, lighted tube with a tiny camera (laparoscope) is used to help perform the procedure. The laparoscope allows a surgeon to make several small incisions in the abdomen instead of one large incision. A robot-assisted method in which a computer is used to control surgical instruments that are attached to robotic arms. Tell a health care provider about: Any allergies you have. All medicines you are taking, including vitamins, herbs, eye drops, creams, and over-the-counter medicines. Any problems you or family members have had with anesthetic medicines. Any blood disorders you have. Any surgeries you have had. Any medical conditions you have. Whether you are pregnant or may be pregnant. What are the risks? Generally, this is a safe procedure. However, problems may occur, including: Infection. Bleeding. Allergic reactions to medicines. Blood clots in the legs or lungs. Damage to nearby structures or organs. What happens before the procedure? Staying hydrated Follow instructions from your health care provider about  hydration, which may include: Up to 2 hours before the procedure - you may continue to drink clear liquids, such as water, clear fruit juice, black coffee, and plain tea. Eating and drinking restrictions Follow instructions from your health care provider about eating and drinking, which may include: 8 hours before the procedure - stop eating heavy meals or foods, such as meat, fried foods, or fatty foods. 6 hours before the procedure - stop eating light meals or foods, such as toast or cereal. 6 hours before the procedure - stop drinking milk or drinks that contain milk. 2 hours before the procedure - stop drinking clear liquids. Medicines Ask your health care provider about: Changing or stopping your regular medicines. This is especially important if you are taking diabetes medicines or blood thinners. Taking medicines such as aspirin and ibuprofen. These medicines can thin your blood. Do not take these medicines unless your health care provider tells you to take them. Taking over-the-counter medicines, vitamins, herbs, and supplements. General instructions Do not use any products that contain nicotine or tobacco for at least 4 weeks before the procedure. These products include cigarettes, chewing tobacco, and vaping devices, such as e-cigarettes. If you need help quitting, ask your health care provider. You may have an exam or tests, such as an electrocardiogram (ECG) or a blood or urine test. Ask your health care provider: How your surgery site will be marked. What steps will be taken to help prevent infection. These steps may include: Removing hair at the surgery site. Washing skin with a germ-killing soap. Taking antibiotic medicine. Plan to have a responsible adult take you home from the hospital or clinic. If you will be  site will be marked.  What steps will be taken to help prevent infection. These steps may include:  Removing hair at the surgery site.  Washing skin with a germ-killing soap.  Taking antibiotic medicine.  Plan to have a responsible adult take you home from the hospital or clinic.  If you will be going home right after the procedure, plan to have a responsible adult care for you for the time you are told. This is important.  What happens during the  procedure?  An IV will be inserted into one of your veins.  You will be given one or both of the following:  A medicine to help you relax (sedative).  A medicine to make you fall asleep (general anesthetic).  A small, thin tube (catheter) may be inserted through your urethra and into your bladder. This will drain urine during your procedure.  Depending on the type of procedure you are having, one incision or several small incisions will be made in your abdomen.  Your fallopian tube and ovary will be cut away from the uterus and removed.  Your blood vessels will be clamped and tied to prevent excess bleeding.  The incision or incisions in your abdomen will be closed with stitches (sutures), staples, or skin glue.  A bandage (dressing) may be placed over your incision or incisions.  The procedure may vary among health care providers and hospitals.  What happens after the procedure?    Your blood pressure, heart rate, breathing rate, and blood oxygen level will be monitored until you leave the hospital or clinic.  You may continue to receive fluids and medicines through an IV.  You may continue to have a catheter draining your urine.  You may have to wear compression stockings. These stockings help to prevent blood clots and reduce swelling in your legs.  You will be given pain medicine as needed.  If you were given a sedative during the procedure, it can affect you for several hours. Do not drive or operate machinery until your health care provider says that it is safe.  Summary  Salpingectomy is a surgical procedure to remove one of the fallopian tubes.  The procedure may be done with an open incision, a thin, lighted tube with a tiny camera (laparoscope), or computer-controlled instruments.  Depending on the type of procedure you have, one incision or several small incisions will be made in your abdomen.  Your blood pressure, heart rate, breathing rate, and blood oxygen level will be monitored until you leave the  hospital or clinic.  Plan to have a responsible adult take you home from the hospital or clinic.  This information is not intended to replace advice given to you by your health care provider. Make sure you discuss any questions you have with your health care provider.  Document Revised: 10/08/2020 Document Reviewed: 10/08/2020  Elsevier Patient Education  2022 [employer]

## 2021-11-14 ENCOUNTER — Telehealth: Payer: Self-pay | Admitting: Obstetrics and Gynecology

## 2021-11-14 NOTE — Telephone Encounter (Signed)
Spoke with the patient to schedule surgery for 12/02/21 and postoop on 1/10 @ 8:55am w/ Dr. Jerene Pitch. Patient will watch for Mychart notifications for her Pre-admit Testing phone interview. Patient confirmed  Medicaid [employer].

## 2021-11-14 NOTE — Telephone Encounter (Signed)
-----   Message from Natale Milch, MD sent at 11/10/2021 12:04 PM EST ----- Regarding: surgery request Surgery Booking Request Patient Full Name:  Gina Gomez  MRN: [id number]  DOB: 22-Jan-1996  Surgeon: Natale Milch, MD  Requested Surgery Date and Time: Patient preference, anytime after 11/22/2021 Primary Diagnosis AND Code: Desires sterilization Secondary Diagnosis and Code:  Surgical Procedure: Robot assisted bilateral salpingectomy RNFA Requested?: Yes L&D Notification: No Admission Status: same day surgery Length of Surgery: 25 min Special Case Needs: No H&P: No Phone Interview???:  Yes Interpreter: No Medical Clearance:  No Special Scheduling Instructions: No Any known health/anesthesia issues, diabetes, sleep apnea, latex allergy, defibrillator/pacemaker?: No Acuity: P3   (P1 highest, P2 delay may cause harm, P3 low, elective gyn, P4 lowest) Post op follow up visits: 1 week post op visit

## 2021-11-21 ENCOUNTER — Inpatient Hospital Stay
Admission: RE | Admit: 2021-11-21 | Discharge: 2021-11-21 | Disposition: A | Discharge status: Home / Self Care | Payer: BLUE CROSS/BLUE SHIELD | Source: Ambulatory Visit

## 2021-11-21 NOTE — Pre-Procedure Instructions (Signed)
Patient states she wishes to reschedule for later date. She was informed she would need to contact [facility] and notify their staff. Gina Gomez aware via secure chat.

## 2021-11-28 ENCOUNTER — Inpatient Hospital Stay
Admission: RE | Admit: 2021-11-28 | Discharge: 2021-11-28 | Disposition: A | Discharge status: Home / Self Care | Payer: Medicaid Other | Source: Ambulatory Visit

## 2021-11-28 NOTE — Pre-Procedure Instructions (Signed)
Called pt for pre op interview. She stated she will be rescheduling her surgery for later date. Secure chat to Marella Bile and Dr Jerene Pitch.

## 2021-11-28 NOTE — Patient Instructions (Signed)
Your procedure is scheduled on: 12/02/20 Report to DAY SURGERY DEPARTMENT LOCATED ON 2ND FLOOR MEDICAL MALL ENTRANCE. To find out your arrival time please call [phone] between 1PM - 3PM on 11/28/21  Remember: Instructions that are not followed completely may result in serious medical risk, up to and including death, or upon the discretion of your surgeon and anesthesiologist your surgery may need to be rescheduled.     _X__ 1. Do not eat food after midnight the night before your procedure.                 No gum chewing or hard candies. You may drink clear liquids up to 2 hours                 before you are scheduled to arrive for your surgery- DO not drink clear                 liquids within 2 hours of the start of your surgery.                 Clear Liquids include:  water, apple juice without pulp, clear carbohydrate                 drink such as Clearfast or Gatorade, Black Coffee or Tea (Do not add                 anything to coffee or tea). Diabetics water only  __X__2.  On the morning of surgery brush your teeth with toothpaste and water, you                 may rinse your mouth with mouthwash if you wish.  Do not swallow any              toothpaste of mouthwash.     _X__ 3.  No Alcohol for 24 hours before or after surgery.   _X__ 4.  Do Not Smoke or use e-cigarettes For 24 Hours Prior to Your Surgery.                 Do not use any chewable tobacco products for at least 6 hours prior to                 surgery.  ____  5.  Bring all medications with you on the day of surgery if instructed.   __X__  6.  Notify your doctor if there is any change in your medical condition      (cold, fever, infections).     Do not wear jewelry, make-up, hairpins, clips or nail polish. Do not wear lotions, powders, or perfumes.  Do not shave body hair 48 hours prior to surgery. Men may shave face and neck. Do not bring valuables to the hospital.    [facility] is not responsible for any  belongings or valuables.  Contacts, dentures/partials or body piercings may not be worn into surgery. Bring a case for your contacts, glasses or hearing aids, a denture cup will be supplied. Leave your suitcase in the car. After surgery it may be brought to your room. For patients admitted to the hospital, discharge time is determined by your treatment team.   Patients discharged the day of surgery will not be allowed to drive home.   Please read over the following fact sheets that you were given:     __X__ Take these medicines the morning of surgery with A SIP OF WATER:  1.   2.   3.   4.  5.  6.  ____ Fleet Enema (as directed)   __X__ Use CHG Soap/SAGE wipes as directed  ____ Use inhalers on the day of surgery  ____ Stop metformin/Janumet/Farxiga 2 days prior to surgery    ____ Take 1/2 of usual insulin dose the night before surgery. No insulin the morning          of surgery.   ____ Stop Blood Thinners Coumadin/Plavix/Xarelto/Pleta/Pradaxa/Eliquis/Effient/Aspirin  on   Or contact your Surgeon, Cardiologist or Medical Doctor regarding  ability to stop your blood thinners  __X__ Stop Anti-inflammatories 7 days before surgery such as Advil, Ibuprofen, Motrin,  BC or Goodies Powder, Naprosyn, Naproxen, Aleve, Aspirin    __X__ Stop all herbal supplements, fish oil or vitamin E until after surgery.    ____ Bring C-Pap to the hospital.      

## 2021-11-30 MED ORDER — LIDOCAINE HCL (PF) 2 % IJ SOLN
INTRAMUSCULAR | Status: AC
  Filled 2021-11-30: qty 5

## 2021-12-02 ENCOUNTER — Encounter: Admission: RE | Payer: Self-pay | Source: Home / Self Care

## 2021-12-02 ENCOUNTER — Ambulatory Visit
Admission: RE | Admit: 2021-12-02 | Payer: Medicaid Other | Source: Home / Self Care | Admitting: Obstetrics and Gynecology

## 2021-12-02 SURGERY — SALPINGECTOMY, ROBOT-ASSISTED
Anesthesia: Choice | Laterality: Bilateral

## 2021-12-02 MED ORDER — PROPOFOL 500 MG/50ML IV EMUL
INTRAVENOUS | Status: AC
  Filled 2021-12-02: qty 100

## 2021-12-09 ENCOUNTER — Ambulatory Visit: Payer: Medicaid Other | Admitting: Obstetrics and Gynecology

## 2021-12-24 ENCOUNTER — Ambulatory Visit
Admission: RE | Admit: 2021-12-24 | Discharge: 2021-12-24 | Disposition: A | Discharge status: Home / Self Care | Payer: BLUE CROSS/BLUE SHIELD | Source: Ambulatory Visit | Attending: Emergency Medicine | Admitting: Emergency Medicine

## 2021-12-24 ENCOUNTER — Emergency Department
Admission: EM | Admit: 2021-12-24 | Discharge: 2021-12-24 | Disposition: A | Discharge status: Home / Self Care | Payer: BLUE CROSS/BLUE SHIELD | Attending: Emergency Medicine | Admitting: Emergency Medicine

## 2021-12-24 ENCOUNTER — Encounter: Payer: Self-pay | Admitting: Medical Oncology

## 2021-12-24 ENCOUNTER — Other Ambulatory Visit: Payer: Self-pay

## 2021-12-24 ENCOUNTER — Telehealth: Payer: BLUE CROSS/BLUE SHIELD | Admitting: Family

## 2021-12-24 ENCOUNTER — Emergency Department: Payer: BLUE CROSS/BLUE SHIELD

## 2021-12-24 VITALS — BP 128/85 | HR 87 | Temp 97.9°F | Resp 16

## 2021-12-24 DIAGNOSIS — M549 Dorsalgia, unspecified: Secondary | ICD-10-CM | POA: Insufficient documentation

## 2021-12-24 DIAGNOSIS — R112 Nausea with vomiting, unspecified: Secondary | ICD-10-CM

## 2021-12-24 DIAGNOSIS — R42 Dizziness and giddiness: Secondary | ICD-10-CM

## 2021-12-24 DIAGNOSIS — M542 Cervicalgia: Secondary | ICD-10-CM | POA: Insufficient documentation

## 2021-12-24 DIAGNOSIS — G44209 Tension-type headache, unspecified, not intractable: Secondary | ICD-10-CM | POA: Diagnosis not present

## 2021-12-24 DIAGNOSIS — R519 Headache, unspecified: Secondary | ICD-10-CM | POA: Diagnosis not present

## 2021-12-24 LAB — CBC WITH DIFFERENTIAL/PLATELET
Abs Immature Granulocytes: 0.17 10*3/uL — ABNORMAL HIGH (ref 0.00–0.07)
Basophils Absolute: 0 10*3/uL (ref 0.0–0.1)
Basophils Relative: 0 %
Eosinophils Absolute: 0 10*3/uL (ref 0.0–0.5)
Eosinophils Relative: 0 %
HCT: 39.6 % (ref 36.0–46.0)
Hemoglobin: 12.6 g/dL (ref 12.0–15.0)
Immature Granulocytes: 2 %
Lymphocytes Relative: 11 %
Lymphs Abs: 1.1 10*3/uL (ref 0.7–4.0)
MCH: 25.7 pg — ABNORMAL LOW (ref 26.0–34.0)
MCHC: 31.8 g/dL (ref 30.0–36.0)
MCV: 80.7 fL (ref 80.0–100.0)
Monocytes Absolute: 0.1 10*3/uL (ref 0.1–1.0)
Monocytes Relative: 1 %
Neutro Abs: 9.1 10*3/uL — ABNORMAL HIGH (ref 1.7–7.7)
Neutrophils Relative %: 86 %
Platelets: 288 10*3/uL (ref 150–400)
RBC: 4.91 MIL/uL (ref 3.87–5.11)
RDW: 14.4 % (ref 11.5–15.5)
WBC: 10.6 10*3/uL — ABNORMAL HIGH (ref 4.0–10.5)
nRBC: 0 % (ref 0.0–0.2)

## 2021-12-24 LAB — BASIC METABOLIC PANEL
Anion gap: 9 (ref 5–15)
BUN: 15 mg/dL (ref 6–20)
CO2: 24 mmol/L (ref 22–32)
Calcium: 9.1 mg/dL (ref 8.9–10.3)
Chloride: 104 mmol/L (ref 98–111)
Creatinine, Ser: 0.84 mg/dL (ref 0.44–1.00)
GFR, Estimated: >60 mL/min (ref >60)
Glucose, Bld: 157 mg/dL — ABNORMAL HIGH (ref 70–99)
Potassium: 4.4 mmol/L (ref 3.5–5.1)
Sodium: 137 mmol/L (ref 135–145)

## 2021-12-24 MED ORDER — PROCHLORPERAZINE EDISYLATE 10 MG/2ML IJ SOLN
10.0000 mg | Freq: Once | INTRAMUSCULAR | Status: AC
  Administered 2021-12-24: 17:00:00 10 mg via INTRAVENOUS
  Filled 2021-12-24: qty 2

## 2021-12-24 MED ORDER — KETOROLAC TROMETHAMINE 30 MG/ML IJ SOLN
30.0000 mg | Freq: Once | INTRAMUSCULAR | Status: AC
  Administered 2021-12-24: 13:00:00 30 mg via INTRAMUSCULAR

## 2021-12-24 MED ORDER — DEXAMETHASONE SODIUM PHOSPHATE 10 MG/ML IJ SOLN
10.0000 mg | Freq: Once | INTRAMUSCULAR | Status: AC
  Administered 2021-12-24: 13:00:00 10 mg via INTRAMUSCULAR

## 2021-12-24 NOTE — ED Triage Notes (Signed)
Pt reports that she was just seen at [facility] and advised to come to ED. Pt reports headache since Saturday with nausea, pain has migrated down neck and into her back. Pt states that she was given 2 injections at [facility] that did not help. Pt is breast feeding.

## 2021-12-24 NOTE — Discharge Instructions (Signed)
-  Take Tylenol/ibuprofen as needed for pain. -Follow-up with your primary care provider if you continue to have recurrent headaches. -Return to the emergency department anytime if you begin to experience any new or worsening symptoms.

## 2021-12-24 NOTE — Progress Notes (Signed)
Virtual Visit Consent   Gina Gomez, you are scheduled for a virtual visit with a [facility] provider today.     Just as with appointments in the office, your consent must be obtained to participate.  Your consent will be active for this visit and any virtual visit you may have with one of our providers in the next 365 days.     If you have a MyChart account, a copy of this consent can be sent to you electronically.  All virtual visits are billed to your insurance company just like a traditional visit in the office.    As this is a virtual visit, video technology does not allow for your provider to perform a traditional examination.  This may limit your provider's ability to fully assess your condition.  If your provider identifies any concerns that need to be evaluated in person or the need to arrange testing (such as labs, EKG, etc.), we will make arrangements to do so.     Although advances in technology are sophisticated, we cannot ensure that it will always work on either your end or our end.  If the connection with a video visit is poor, the visit may have to be switched to a telephone visit.  With either a video or telephone visit, we are not always able to ensure that we have a secure connection.     I need to obtain your verbal consent now.   Are you willing to proceed with your visit today?    Gina Gomez has provided verbal consent on 12/24/2021 for a virtual visit (video or telephone).   Jannifer Rodney, FNP   Date: 12/24/2021 10:11 AM   Virtual Visit via Video Note   I, Jannifer Rodney, connected with  Gina Gomez  ([id number], 07/19/1996) on 12/24/21 at 10:15 AM EST by a video-enabled telemedicine application and verified that I am speaking with the correct person using two identifiers.  Location: Patient: Virtual Visit Location Patient: Home Provider: Virtual Visit Location Provider: Home Office   I discussed the limitations of evaluation and management by telemedicine  and the availability of in person appointments. The patient expressed understanding and agreed to proceed.    History of Present Illness: Gina Gomez is a 26 y.o. who identifies as a female who was assigned female at birth, and is being seen today for headache that started Sunday on and off. She reports last night she started having dizziness and nausea. She took zofran that greatly helped, but this morning her head started hurting again.   HPI: Headache  This is a new problem. The current episode started in the past 7 days. The problem occurs intermittently. The problem has been waxing and waning. Pain location: started right side,now entire head. The pain quality is not similar to prior headaches. The quality of the pain is described as sharp and dull. The pain is at a severity of 6/10. Associated symptoms include dizziness. Pertinent negatives include no blurred vision, nausea, phonophobia or photophobia. Associated symptoms comments: Neck soreness. Treatments tried: zofran. The treatment provided mild relief. There is no history of migraines in the family.   Problems:  Patient Active Problem List   Diagnosis Date Noted   Gestational diabetes mellitus (GDM) affecting second pregnancy 10/15/2021   Spontaneous vaginal delivery    Encounter for induction of labor    Labor and delivery, indication for care 10/08/2021   Obesity BMI=39.7 01/02/2020    Allergies: No Known Allergies Medications:  Current Outpatient Medications:    Insulin Pen Needle 32G X 8 MM MISC, Use as directed with insulin (Patient not taking: Reported on 11/10/2021), Disp: 100 each, Rfl: 3   LANTUS SOLOSTAR 100 UNIT/ML Solostar Pen, Inject 25 Units into the skin at bedtime., Disp: , Rfl:   Observations/Objective: Patient is well-developed, well-nourished in no acute distress.  Resting comfortably  at home.  Head is normocephalic, atraumatic.  No labored breathing.  Speech is clear and coherent with logical content.   Patient is alert and oriented at baseline.    Assessment and Plan: 1. New onset headache  2. Dizziness  3. Nausea and vomiting, unspecified vomiting type  Given new onset of headache, no history of migraines, and new onset dizziness, she needs to be seen in person for a full neuro examination and possible scan? Pt advised to be seen today.    Follow Up Instructions: I discussed the assessment and treatment plan with the patient. The patient was provided an opportunity to ask questions and all were answered. The patient agreed with the plan and demonstrated an understanding of the instructions.  A copy of instructions were sent to the patient via MyChart unless otherwise noted below.    The patient was advised to call back or seek an in-person evaluation if the symptoms worsen or if the condition fails to improve as anticipated.  Time:  I spent 19 minutes with the patient via telehealth technology discussing the above problems/concerns.    Jannifer Rodney, FNP

## 2021-12-24 NOTE — ED Triage Notes (Signed)
Patient presents to Urgent Care with complaints of dizziness, headache, and nausea since Saturday. Pt states intermittent shooting pain back of head. Pt states she treated nausea with zofran. She a video visit with provider who instructed her to come for eval and possible CT scan.

## 2021-12-24 NOTE — ED Provider Notes (Addendum)
[facility]    CSN: [id number] Arrival date & time: 12/24/21  1219      History   Chief Complaint Chief Complaint  Patient presents with   Appointment    1200   Dizziness   Nausea   Headache    HPI Gina Gomez is a 26 y.o. female.   Patient presents with intermittent headache for 4 days.  Headache located on the right posterior aspect described as sharp and stabbing.  Last night headache became constant and generalized with associated dizziness and vomiting x2.  Pain and discomfort can be felt in the posterior neck radiating down into the back.  Denies photophobia, photophobia, blurred vision, increased stress, generalized weakness, lightheadedness, changes in speech.  Recently gave birth in December 2022, currently breast-feeding, menses has not started.  Denies fever, chills, URI symptoms, changes in diet, recent travel.  Video visit with PCP this morning who recommended in person visit with possible need for CT scan.      Past Medical History:  Diagnosis Date   Gestational diabetes    Medical history non-contributory    Obesity (BMI 35.0-39.9 without comorbidity)     Patient Active Problem List   Diagnosis Date Noted   Gestational diabetes mellitus (GDM) affecting second pregnancy 10/15/2021   Spontaneous vaginal delivery    Encounter for induction of labor    Labor and delivery, indication for care 10/08/2021   Obesity BMI=39.7 01/02/2020    Past Surgical History:  Procedure Laterality Date   NO PAST SURGERIES      OB History     Gravida  2   Para  1   Term  1   Preterm      AB      Living  2      SAB      IAB      Ectopic      Multiple  0   Live Births               Home Medications    Prior to Admission medications   Medication Sig Start Date End Date Taking? Authorizing Provider  Insulin Pen Needle 32G X 8 MM MISC Use as directed with insulin Patient not taking: Reported on 11/10/2021 08/20/21   Will Bonnet, MD  LANTUS SOLOSTAR 100 UNIT/ML Solostar Pen Inject 25 Units into the skin at bedtime. 11/19/21   [provider]    Family History Family History  Problem Relation Age of Onset   Hypertension Maternal Grandfather    Diabetes Maternal Grandfather    Heart disease Maternal Grandfather    Cancer Maternal Grandmother    Ovarian cysts Mother    Heart disease Paternal Grandmother    High Cholesterol Paternal Grandmother     Social History Social History   Tobacco Use   Smoking status: Never   Smokeless tobacco: Never  Vaping Use   Vaping Use: Never used  Substance Use Topics   Alcohol use: No    Alcohol/week: 0.0 standard drinks   Drug use: No     Allergies   Patient has no known allergies.   Review of Systems Review of Systems  Constitutional: Negative.   HENT: Negative.    Respiratory: Negative.    Cardiovascular: Negative.   Skin: Negative.   Neurological:  Positive for dizziness, light-headedness and headaches. Negative for tremors, seizures, syncope, facial asymmetry, speech difficulty, weakness and numbness.    Physical Exam Triage Vital Signs ED Triage Vitals  Enc Vitals Group     BP 12/24/21 1232 128/85     Pulse Rate 12/24/21 1232 87     Resp 12/24/21 1232 16     Temp 12/24/21 1232 97.9 F (36.6 C)     Temp Source 12/24/21 1232 Oral     SpO2 12/24/21 1232 100 %     Weight --      Height --      Head Circumference --      Peak Flow --      Pain Score 12/24/21 1231 5     Pain Loc --      Pain Edu? --      Excl. in [place]? --    No data found.  Updated Vital Signs BP 128/85 (BP Location: Left Arm)    Pulse 87    Temp 97.9 F (36.6 C) (Oral)    Resp 16    SpO2 100%    Breastfeeding Yes   Visual Acuity Right Eye Distance:   Left Eye Distance:   Bilateral Distance:    Right Eye Near:   Left Eye Near:    Bilateral Near:     Physical Exam Constitutional:      Appearance: She is well-developed.  HENT:     Head:  Normocephalic.  Eyes:     Extraocular Movements: Extraocular movements intact.     Pupils: Pupils are equal, round, and reactive to light.  Cardiovascular:     Rate and Rhythm: Normal rate and regular rhythm.     Pulses: Normal pulses.     Heart sounds: Normal heart sounds.  Pulmonary:     Effort: Pulmonary effort is normal.     Breath sounds: Normal breath sounds.  Skin:    General: Skin is warm and dry.  Neurological:     General: No focal deficit present.     Mental Status: She is alert and oriented to person, place, and time. Mental status is at baseline.     Cranial Nerves: No cranial nerve deficit.     Sensory: No sensory deficit.     Motor: No weakness.     Gait: Gait normal.  Psychiatric:        Mood and Affect: Mood normal.        Behavior: Behavior normal.     UC Treatments / Results  Labs (all labs ordered are listed, but only abnormal results are displayed) Labs Reviewed - No data to display  EKG   Radiology No results found.  Procedures Procedures (including critical care time)  Medications Ordered in UC Medications - No data to display  Initial Impression / Assessment and Plan / UC Course  I have reviewed the triage vital signs and the nursing notes.  Pertinent labs & imaging results that were available during my care of the patient were reviewed by me and considered in my medical decision making (see chart for details).  Bad headache Dizziness  Vital signs are stable, patient is in no signs of distress, no abnormalities found on neurological exam, discussed findings with patient, attempted use of IM Decadron and Toradol for headache management, ineffective, will avoid use of Imitrex due to breast-feeding, declined IM Zofran as she has oral Zofran at home, unable to complete CT in office due to insurance coverage, recommended follow-up in emergency department for further evaluation or contacting PCP for possible outpatient imaging, patient is chosen to  go to [place] [facility] for further evaluation, to be escorted by significant other Final  Clinical Impressions(s) / UC Diagnoses   Final diagnoses:  None   Discharge Instructions   None    ED Prescriptions   None    PDMP not reviewed this encounter.   Hans Eden, NP 12/24/21 1706    Hans Eden, NP 12/24/21 1722

## 2021-12-24 NOTE — ED Provider Notes (Signed)
[facility] Provider Note    Event Date/Time   First MD Initiated Contact with Patient 12/24/21 1501     (approximate)   History   Chief Complaint Headache   HPI  Gina Gomez is a 26 y.o. female, history of elevated BMI, presents the emergency department for evaluation of headache.  Patient states that her headache began approximately 4 days ago.  Describes as dull sensation on the right side of her head.  She states that the pain has been intermittent over the past 4 days, however last night has become constant and associated with 2 episodes of vomiting.  She additionally endorses some midline neck pain and back pain as well.  She presented to the urgent care this morning who gave her dexamethasone and ketorolac.  Expressed concern that she may need a head CT and referred her to the emergency department.  Denies fever/chills, chest pain, shortness of breath, blurred vision, hearing changes, abdominal pain, urinary symptoms, or numbness/tingliness in her upper or lower extremities.  History Limitations: No limitations.      Physical Exam  Triage Vital Signs: ED Triage Vitals  Enc Vitals Group     BP 12/24/21 1450 (!) 145/79     Pulse Rate 12/24/21 1450 91     Resp 12/24/21 1450 16     Temp 12/24/21 1452 97.8 F (36.6 C)     Temp Source 12/24/21 1452 Oral     SpO2 12/24/21 1450 98 %     Weight 12/24/21 1451 220 lb (99.8 kg)     Height 12/24/21 1451 5\' 2"  (1.575 m)     Head Circumference --      Peak Flow --      Pain Score 12/24/21 1451 5     Pain Loc --      Pain Edu? --      Excl. in [place]? --     Most recent vital signs: Vitals:   12/24/21 1452 12/24/21 1813  BP:  (!) 142/70  Pulse:  (!) 105  Resp:  16  Temp: 97.8 F (36.6 C)   SpO2:  98%     Physical Exam Constitutional:      General: She is not in acute distress.    Appearance: Normal appearance. She is not ill-appearing.  HENT:     Head: Normocephalic and atraumatic.  Eyes:      Extraocular Movements: Extraocular movements intact.     Pupils: Pupils are equal, round, and reactive to light.  Neck:     Comments: No midline tenderness.  Normal range of motion. Pulmonary:     Effort: Pulmonary effort is normal.  Abdominal:     General: Abdomen is flat.     Palpations: Abdomen is soft.     Tenderness: There is no abdominal tenderness.  Musculoskeletal:     Cervical back: Normal range of motion and neck supple.     Comments: No gross deformities or midline spinal tenderness  Skin:    General: Skin is warm and dry.     Capillary Refill: Capillary refill takes less than 2 seconds.  Neurological:     Mental Status: She is alert. Mental status is at baseline.     Comments: Cranial nerves II through XII intact.  She is able to ambulate without difficulty.      ED Results / Procedures / Treatments  Labs (all labs ordered are listed, but only abnormal results are displayed) Labs Reviewed  CBC WITH DIFFERENTIAL/PLATELET -  Abnormal; Notable for the following components:      Result Value   WBC 10.6 (*)    MCH 25.7 (*)    Neutro Abs 9.1 (*)    Abs Immature Granulocytes 0.17 (*)    All other components within normal limits  BASIC METABOLIC PANEL - Abnormal; Notable for the following components:   Glucose, Bld 157 (*)    All other components within normal limits     EKG Not applicable.   RADIOLOGY  ED Provider Interpretation: I personally reviewed these images.  No acute findings on head/neck CT  CT Head Wo Contrast  Result Date: 12/24/2021 CLINICAL DATA:  Headache and nausea. EXAM: CT HEAD WITHOUT CONTRAST CT CERVICAL SPINE WITHOUT CONTRAST TECHNIQUE: Multidetector CT imaging of the head and cervical spine was performed following the standard protocol without intravenous contrast. Multiplanar CT image reconstructions of the cervical spine were also generated. RADIATION DOSE REDUCTION: This exam was performed according to the departmental  dose-optimization program which includes automated exposure control, adjustment of the mA and/or kV according to patient size and/or use of iterative reconstruction technique. COMPARISON:  None. FINDINGS: CT HEAD FINDINGS Brain: The ventricles are normal in size and configuration. No extra-axial fluid collections are identified. The gray-white differentiation is maintained. No CT findings for acute hemispheric infarction or intracranial hemorrhage. No mass lesions. The brainstem and cerebellum are normal. Vascular: No hyperdense vessels or obvious aneurysm. Skull: No acute skull fracture.  No bone lesion. Sinuses/Orbits: The paranasal sinuses and mastoid air cells are clear. The globes are intact. Other: No scalp lesions, laceration or hematoma. CT CERVICAL SPINE FINDINGS Alignment: Normal Skull base and vertebrae: No acute fracture. No primary bone lesion or focal pathologic process. Soft tissues and spinal canal: No prevertebral fluid or swelling. No visible canal hematoma. Disc levels: The spinal canal is generous. No large disc protrusions or canal stenosis. Upper chest: The lung apices are grossly clear. Other: No neck mass, adenopathy or hematoma. IMPRESSION: Normal CT scan of the head and cervical spine. Electronically Signed   By: Rudie Meyer M.D.   On: 12/24/2021 16:04   CT Cervical Spine Wo Contrast  Result Date: 12/24/2021 CLINICAL DATA:  Headache and nausea. EXAM: CT HEAD WITHOUT CONTRAST CT CERVICAL SPINE WITHOUT CONTRAST TECHNIQUE: Multidetector CT imaging of the head and cervical spine was performed following the standard protocol without intravenous contrast. Multiplanar CT image reconstructions of the cervical spine were also generated. RADIATION DOSE REDUCTION: This exam was performed according to the departmental dose-optimization program which includes automated exposure control, adjustment of the mA and/or kV according to patient size and/or use of iterative reconstruction technique.  COMPARISON:  None. FINDINGS: CT HEAD FINDINGS Brain: The ventricles are normal in size and configuration. No extra-axial fluid collections are identified. The gray-white differentiation is maintained. No CT findings for acute hemispheric infarction or intracranial hemorrhage. No mass lesions. The brainstem and cerebellum are normal. Vascular: No hyperdense vessels or obvious aneurysm. Skull: No acute skull fracture.  No bone lesion. Sinuses/Orbits: The paranasal sinuses and mastoid air cells are clear. The globes are intact. Other: No scalp lesions, laceration or hematoma. CT CERVICAL SPINE FINDINGS Alignment: Normal Skull base and vertebrae: No acute fracture. No primary bone lesion or focal pathologic process. Soft tissues and spinal canal: No prevertebral fluid or swelling. No visible canal hematoma. Disc levels: The spinal canal is generous. No large disc protrusions or canal stenosis. Upper chest: The lung apices are grossly clear. Other: No neck mass, adenopathy or hematoma.  IMPRESSION: Normal CT scan of the head and cervical spine. Electronically Signed   By: Rudie MeyerP.  Gallerani M.D.   On: 12/24/2021 16:04    PROCEDURES:  Critical Care performed: None.  Procedures    MEDICATIONS ORDERED IN ED: Medications  prochlorperazine (COMPAZINE) injection 10 mg (10 mg Intravenous Given 12/24/21 1712)     IMPRESSION / MDM / ASSESSMENT AND PLAN / ED COURSE  I reviewed the triage vital signs and the nursing notes.                              Gina Gomez is a 26 y.o. female, history of elevated BMI, presents the emergency department for evaluation of headache.  Patient states that her headache began approximately 4 days ago. Describes as dull sensation on the right side of her head.  She states that the pain has been intermittent over the past 4 days, however last night has become constant and associated with 2 episodes of vomiting.   Differential diagnosis includes, but is not limited to, migraine,  tension headache, pseudotumor cerebri, cavernous venous thrombosis, subarachnoid hemorrhage.  Patient appears well.  Her vital signs are within normal limits.  Physical exam is overall unremarkable.   BMP unremarkable for any electrolyte abnormalities or kidney injury.  CBC shows mild leukocytosis at 10.6, likely due to pain versus recent corticosteroids.  No anemia present.  Head CT/neck CT unremarkable for any acute pathology.   Upon reexamination, patient states that she feels well.  Patient states that her headache is 0/10 after the compazine.  History and physical exam consistent with tension type headache versus migraine headache.  Given her negative imaging and unremarkable lab work-up, I do not suspect serious or any life threatening pathology.  Low suspicion for subarachnoid hemorrhage,meningitis, or cavernous sinus thrombosis.  Advised patient to follow-up with her primary care provider if she continues to have recurrent headaches.  We will plan to discharge this patient home.   Patient was provided with anticipatory guidance, return precautions, and educational material. Encouraged the patient to return to the emergency department at any time if they begin to experience any new or worsening symptoms.       FINAL CLINICAL IMPRESSION(S) / ED DIAGNOSES   Final diagnoses:  Acute non intractable tension-type headache     Rx / DC Orders   ED Discharge Orders     None        Note:  This document was prepared using Dragon voice recognition software and may include unintentional dictation errors.   Varney DailySimpson, Dominyck Reser Lee, [place] 12/24/21 2008    Gilles ChiquitoSmith, Zachary P, MD 12/24/21 [phone]

## 2021-12-24 NOTE — ED Notes (Signed)
Patient is being discharged from the Urgent Care and sent to the Emergency Department via POV . Per Lowella Petties, NP, patient is in need of higher level of care due to Headache. Patient is aware and verbalizes understanding of plan of care.  Vitals:   12/24/21 1232  BP: 128/85  Pulse: 87  Resp: 16  Temp: 97.9 F (36.6 C)  SpO2: 100%

## 2021-12-24 NOTE — Discharge Instructions (Signed)
Unfortunately we are unable to complete a CT scanning here in the urgent care today  On your exam there were no abnormalities and your vital signs are stable however his symptoms have not improved is recommended that she go to the nearest emergency department for further evaluation, you may either go to [facility] or [facility]

## 2022-01-27 ENCOUNTER — Encounter: Payer: Self-pay | Admitting: Obstetrics and Gynecology

## 2022-01-27 NOTE — Telephone Encounter (Signed)
Called patient to sre-chedule Robot assisted bilateral salpingectomy with Dominica Severin 3/21  H&P  n/a  Pre-admit phone call appointment to be requested - All appointments will be updated on pt MyChart. Explained that this appointment has a call window. Based on the time scheduled will indicate if the call will be received within a 4 hour window before 1:00 or after.  Advised that pt may also receive calls from the hospital pharmacy and pre-service center.  Confirmed pt has [employer] as primary insurance. No secondary insurance.   Medicaid BTL consent signed 08/26/21

## 2022-02-09 ENCOUNTER — Other Ambulatory Visit: Payer: Self-pay

## 2022-02-09 ENCOUNTER — Encounter
Admission: RE | Admit: 2022-02-09 | Discharge: 2022-02-09 | Disposition: A | Discharge status: Home / Self Care | Payer: BLUE CROSS/BLUE SHIELD | Source: Ambulatory Visit | Attending: Obstetrics and Gynecology | Admitting: Obstetrics and Gynecology

## 2022-02-09 HISTORY — DX: Headache, unspecified: R51.9

## 2022-02-09 NOTE — Patient Instructions (Addendum)
Your procedure is scheduled on: Tuesday 02/17/22 ?Report to the Registration Desk on the 1st floor of the [facility]. ?To find out your arrival time, please call [phone] between 1PM - 3PM on: Monday 02/16/22 ? ?REMEMBER: ?Instructions that are not followed completely may result in serious medical risk, up to and including death; or upon the discretion of your surgeon and anesthesiologist your surgery may need to be rescheduled. ? ?Do not eat food after midnight the night before surgery.  ?No gum chewing, lozengers or hard candies. ? ?You may however, drink CLEAR liquids up to 2 hours before you are scheduled to arrive for your surgery. Do not drink anything within 2 hours of your scheduled arrival time. ? ?Clear liquids include: ?- water  ?- apple juice without pulp ?- gatorade (not RED colors) ?- black coffee or tea (Do NOT add milk or creamers to the coffee or tea) ?Do NOT drink anything that is not on this list. ? ?TAKE THESE MEDICATIONS THE MORNING OF SURGERY WITH A SIP OF WATER: None ? ?One week prior to surgery: ?Stop Anti-inflammatories (NSAIDS) such as Advil, Aleve, Ibuprofen, Motrin, Naproxen, Naprosyn and Aspirin based products such as Excedrin, Goodys Powder, BC Powder. ?Stop taking your cholecalciferol (VITAMIN D3) 25 MCG (1000 UNIT) tablet, Prenatal Vit-Fe Fumarate-FA (PRENATAL MULTIVITAMIN) TABS tablet and ANY other OVER THE COUNTER supplements until after surgery. ?You may however, continue to take Tylenol if needed for pain up until the day of surgery. ? ?No Alcohol for 24 hours before or after surgery. ? ?No Smoking including e-cigarettes for 24 hours prior to surgery.  ?No chewable tobacco products for at least 6 hours prior to surgery.  ?No nicotine patches on the day of surgery. ? ?Do not use any "recreational" drugs for at least a week prior to your surgery.  ?Please be advised that the combination of cocaine and anesthesia may have negative outcomes, up to and including death. ?If you  test positive for cocaine, your surgery will be cancelled. ? ?On the morning of surgery brush your teeth with toothpaste and water, you may rinse your mouth with mouthwash if you wish. ?Do not swallow any toothpaste or mouthwash. ? ?Do not wear jewelry, make-up, hairpins, clips or nail polish. ? ?Do not wear lotions, powders, or perfumes.  ? ?Do not shave body from the neck down 48 hours prior to surgery just in case you cut yourself which could leave a site for infection.  ?Also, freshly shaved skin may become irritated if using the CHG soap. ? ?Contact lenses, hearing aids and dentures may not be worn into surgery. ? ?Do not bring valuables to the hospital. [facility] is not responsible for any missing/lost belongings or valuables.  ? ?Notify your doctor if there is any change in your medical condition (cold, fever, infection). ? ?Wear comfortable clothing (specific to your surgery type) to the hospital. ? ?When coughing or sneezing, hold a pillow firmly against your incision with both hands. This is called ?splinting.? Doing this helps protect your incision. It also decreases belly discomfort. ? ?If you are being discharged the day of surgery, you will not be allowed to drive home. ?You will need a responsible adult (18 years or older) to drive you home and stay with you that night.  ? ?If you are taking public transportation, you will need to have a responsible adult (18 years or older) with you. ?Please confirm with your physician that it is acceptable to use public transportation.  ? ?  Please call the Pre-admissions Testing Dept. at [phone] if you have any questions about these instructions. ? ?Surgery Visitation Policy: ? ?Patients undergoing a surgery or procedure may have one family member or support person with them as long as that person is not COVID-19 positive or experiencing its symptoms.  ?That person may remain in the waiting area during the procedure and may rotate out with other  people. ? ?Inpatient Visitation:   ? ?Visiting hours are 7 a.m. to 8 p.m. ?Up to two visitors ages 16+ are allowed at one time in a patient room. The visitors may rotate out with other people during the day. Visitors must check out when they leave, or other visitors will not be allowed. One designated support person may remain overnight. ?The visitor must pass COVID-19 screenings, use hand sanitizer when entering and exiting the patient?s room and wear a mask at all times, including in the patient?s room. ?Patients must also wear a mask when staff or their visitor are in the room. ?Masking is required regardless of vaccination status.  ?

## 2022-02-11 ENCOUNTER — Encounter: Admission: RE | Admit: 2022-02-11 | Payer: BLUE CROSS/BLUE SHIELD | Source: Ambulatory Visit

## 2022-02-16 ENCOUNTER — Inpatient Hospital Stay: Admission: RE | Admit: 2022-02-16 | Payer: BLUE CROSS/BLUE SHIELD | Source: Ambulatory Visit

## 2022-02-16 ENCOUNTER — Telehealth: Payer: Self-pay | Admitting: Obstetrics and Gynecology

## 2022-02-16 NOTE — Telephone Encounter (Signed)
Patient called and requested to cancel her surgery scheduled for 02/17/22 with [facility]. She does not have childcare. ? ?The next date Gina Gomez is in the OR is 02/24/22 however the patient's Medicaid BTL consent will have expired. So she in unable to schedule at this time. ? ?Pt said that she will try to stop by the office tomorrow to sign a new BTL consent. I reminded her that she will have to wait 30 days from the day she signs before scheduling surgery. ?

## 2022-02-16 NOTE — Telephone Encounter (Signed)
Molli Knock- would have her set up with new physician then to do surgery- thank you

## 2022-02-17 ENCOUNTER — Ambulatory Visit
Admission: RE | Admit: 2022-02-17 | Payer: Medicaid Other | Source: Home / Self Care | Admitting: Obstetrics and Gynecology

## 2022-02-17 ENCOUNTER — Encounter: Admission: RE | Payer: Self-pay | Source: Home / Self Care

## 2022-02-17 DIAGNOSIS — Z3009 Encounter for other general counseling and advice on contraception: Secondary | ICD-10-CM

## 2022-02-17 SURGERY — SALPINGECTOMY, ROBOT-ASSISTED
Anesthesia: Choice | Laterality: Bilateral

## 2022-02-23 ENCOUNTER — Encounter: Payer: BLUE CROSS/BLUE SHIELD | Admitting: Obstetrics and Gynecology

## 2022-03-03 DIAGNOSIS — F419 Anxiety disorder, unspecified: Secondary | ICD-10-CM | POA: Insufficient documentation

## 2022-03-03 DIAGNOSIS — H269 Unspecified cataract: Secondary | ICD-10-CM | POA: Insufficient documentation

## 2022-03-03 DIAGNOSIS — Z8669 Personal history of other diseases of the nervous system and sense organs: Secondary | ICD-10-CM | POA: Insufficient documentation

## 2022-03-16 ENCOUNTER — Telehealth: Payer: Self-pay | Admitting: Obstetrics and Gynecology

## 2022-03-16 NOTE — Telephone Encounter (Signed)
Called pt back to advise that her [employer] is OON and that they will not cover. She advised that she will look elsewhere to have the BTL done. She was very understanding. ?

## 2022-03-16 NOTE — Telephone Encounter (Signed)
Pt called and would like to reschedule her BTL. She canceled her previously scheduled dos with [facility] 02/17/22. She was scheduled for a robot assisted bilaterial salpingectomy. ? ?I scheduled pt for BTL consult 04/15/22 @ 10:55. I also offered 04/23/22 as a potential surgery date.  ? ?Dr Jolayne Panther please confirm that you are ok with 04/23/22 as DOS. ?

## 2022-03-19 ENCOUNTER — Telehealth: Payer: BLUE CROSS/BLUE SHIELD | Admitting: Physician Assistant

## 2022-03-19 DIAGNOSIS — L729 Follicular cyst of the skin and subcutaneous tissue, unspecified: Secondary | ICD-10-CM | POA: Diagnosis not present

## 2022-03-19 DIAGNOSIS — L089 Local infection of the skin and subcutaneous tissue, unspecified: Secondary | ICD-10-CM

## 2022-03-19 MED ORDER — CEPHALEXIN 500 MG PO CAPS: 500.0000 mg | ORAL_CAPSULE | Freq: Two times a day (BID) | ORAL | 0 refills | Status: AC

## 2022-03-19 NOTE — Progress Notes (Signed)
?Virtual Visit Consent  ? ?[name], you are scheduled for a virtual visit with a [facility] provider today.   ?  ?Just as with appointments in the office, your consent must be obtained to participate.  Your consent will be active for this visit and any virtual visit you may have with one of our providers in the next 365 days.   ?  ?If you have a MyChart account, a copy of this consent can be sent to you electronically.  All virtual visits are billed to your insurance company just like a traditional visit in the office.   ? ?As this is a virtual visit, video technology does not allow for your provider to perform a traditional examination.  This may limit your provider's ability to fully assess your condition.  If your provider identifies any concerns that need to be evaluated in person or the need to arrange testing (such as labs, EKG, etc.), we will make arrangements to do so.   ?  ?Although advances in technology are sophisticated, we cannot ensure that it will always work on either your end or our end.  If the connection with a video visit is poor, the visit may have to be switched to a telephone visit.  With either a video or telephone visit, we are not always able to ensure that we have a secure connection.    ? ?Also, by engaging in this virtual visit, you consent to the provision of healthcare. Additionally, you authorize for your insurance to be billed (if applicable) for the services provided during this visit.  ? ?I need to obtain your verbal consent now.   Are you willing to proceed with your visit today?  ?  ?Gina Gomez has provided verbal consent on 03/19/2022 for a virtual visit (video or telephone). ?  ?[name], PA-C  ? ?Date: 03/19/2022 4:03 PM ? ? ?Virtual Visit via Video Note  ? ?Gina Gomez, connected with  [name]  ([id number], January 27, 1996) on 03/19/22 at  4:00 PM EDT by a video-enabled telemedicine application and verified that I am speaking with the correct  person using two identifiers. ? ?Location: ?Patient: Virtual Visit Location Patient: Home ?Provider: Virtual Visit Location Provider: Home Office ?  ?I discussed the limitations of evaluation and management by telemedicine and the availability of in person appointments. The patient expressed understanding and agreed to proceed.   ? ?History of Present Illness: ?Gina Gomez is a 26 y.o. who identifies as a female who was assigned female at birth, and is being seen today for possible infected cyst of skin behind her left ear first noted a few days ago. Notes a small squishy bump that has become irritated and painful with redness and warmth. Denies drainage form the area. Denies ear pain, neck pain. Denies fever, chills, malaise.  ? ?HPI: HPI  ?Problems:  ?Patient Active Problem List  ? Diagnosis Date Noted  ? Gestational diabetes mellitus (GDM) affecting second pregnancy 10/15/2021  ? Spontaneous vaginal delivery   ? Encounter for induction of labor   ? Labor and delivery, indication for care 10/08/2021  ? Obesity BMI=39.7 01/02/2020  ?  ?Allergies: No Known Allergies ?Medications:  ?Current Outpatient Medications:  ?  busPIRone (BUSPAR) 5 MG tablet, Take by mouth., Disp: , Rfl:  ?  cephALEXin (KEFLEX) 500 MG capsule, Take 1 capsule (500 mg total) by mouth 2 (two) times daily for 7 days., Disp: 14 capsule, Rfl: 0 ?  sertraline (ZOLOFT) 50 MG tablet, Take 1 tablet by mouth daily., Disp: , Rfl:  ?  cholecalciferol (VITAMIN D3) 25 MCG (1000 UNIT) tablet, Take 1,000 Units by mouth daily., Disp: , Rfl:  ?  Insulin Pen Needle 32G X 8 MM MISC, Use as directed with insulin (Patient not taking: Reported on 11/10/2021), Disp: 100 each, Rfl: 3 ?  Prenatal Vit-Fe Fumarate-FA (PRENATAL MULTIVITAMIN) TABS tablet, Take 2 tablets by mouth daily., Disp: , Rfl:  ? ?Observations/Objective: ?Patient is well-developed, well-nourished in no acute distress.  ?Resting comfortably at home.  ?Head is normocephalic, atraumatic.  ?No labored  breathing. ?Speech is clear and coherent with logical content.  ?Patient is alert and oriented at baseline.  ? ?Assessment and Plan: ?1. Infected cyst of skin ?- cephALEXin (KEFLEX) 500 MG capsule; Take 1 capsule (500 mg total) by mouth 2 (two) times daily for 7 days.  Dispense: 14 capsule; Refill: 0 ? ?Versus small boil. No alarm signs or symptoms present. Start Keflex 500 mg BID. Supportive measures and OTC mediations reviewed. Follow-up in person if not resolving or any new/worsening symptoms despite treatment.  ? ?Follow Up Instructions: ?I discussed the assessment and treatment plan with the patient. The patient was provided an opportunity to ask questions and all were answered. The patient agreed with the plan and demonstrated an understanding of the instructions.  A copy of instructions were sent to the patient via MyChart unless otherwise noted below.  ? ? ?The patient was advised to call back or seek an in-person evaluation if the symptoms worsen or if the condition fails to improve as anticipated. ? ?Time:  ?I spent 10 minutes with the patient via telehealth technology discussing the above problems/concerns.   ? ?[name], PA-C ?

## 2022-03-19 NOTE — Patient Instructions (Signed)
?  Nichole L Gersten, thank you for joining Piedad Climes, PA-C for today's virtual visit.  While this provider is not your primary care provider (PCP), if your PCP is located in our provider database this encounter information will be shared with them immediately following your visit. ? ?Consent: ?(Patient) Gina Gomez provided verbal consent for this virtual visit at the beginning of the encounter. ? ?Current Medications: ? ?Current Outpatient Medications:  ?  busPIRone (BUSPAR) 5 MG tablet, Take by mouth., Disp: , Rfl:  ?  cephALEXin (KEFLEX) 500 MG capsule, Take 1 capsule (500 mg total) by mouth 2 (two) times daily for 7 days., Disp: 14 capsule, Rfl: 0 ?  sertraline (ZOLOFT) 50 MG tablet, Take 1 tablet by mouth daily., Disp: , Rfl:  ?  cholecalciferol (VITAMIN D3) 25 MCG (1000 UNIT) tablet, Take 1,000 Units by mouth daily., Disp: , Rfl:  ?  Insulin Pen Needle 32G X 8 MM MISC, Use as directed with insulin (Patient not taking: Reported on 11/10/2021), Disp: 100 each, Rfl: 3 ?  Prenatal Vit-Fe Fumarate-FA (PRENATAL MULTIVITAMIN) TABS tablet, Take 2 tablets by mouth daily., Disp: , Rfl:   ? ?Medications ordered in this encounter:  ?Meds ordered this encounter  ?Medications  ? cephALEXin (KEFLEX) 500 MG capsule  ?  Sig: Take 1 capsule (500 mg total) by mouth 2 (two) times daily for 7 days.  ?  Dispense:  14 capsule  ?  Refill:  0  ?  Order Specific Question:   Supervising Provider  ?  Answer:   Eber Hong [[id number]]  ?  ? ?*If you need refills on other medications prior to your next appointment, please contact your pharmacy* ? ?Follow-Up: ?Call back or seek an in-person evaluation if the symptoms worsen or if the condition fails to improve as anticipated. ? ?Other Instructions ?Please keep skin clean and dry. ?Apply warm compresses to the area for 10-15 minutes, a few times per day. ?Take the antibiotic as directed. ? ?If not resolving or any new/worsening symptoms, please seek an in-person evaluation ASAP.   ? ? ?If you have been instructed to have an in-person evaluation today at a local Urgent Care facility, please use the link below. It will take you to a list of all of our available [facility], including address, phone number and hours of operation. Please do not delay care.  ?[facility] ? ?If you or a family member do not have a primary care provider, use the link below to schedule a visit and establish care. When you choose a [facility] primary care physician or advanced practice provider, you gain a long-term partner in health. ?Find a Primary Care Provider ? ?Learn more about [facility]'s in-office and virtual care options: ?[facility] - Get Care Now  ?

## 2022-04-06 DIAGNOSIS — E781 Pure hyperglyceridemia: Secondary | ICD-10-CM | POA: Insufficient documentation

## 2022-04-15 ENCOUNTER — Ambulatory Visit: Payer: BLUE CROSS/BLUE SHIELD | Admitting: Obstetrics and Gynecology

## 2022-04-17 ENCOUNTER — Telehealth: Payer: Medicaid Other | Admitting: Emergency Medicine

## 2022-04-17 DIAGNOSIS — J020 Streptococcal pharyngitis: Secondary | ICD-10-CM

## 2022-04-17 MED ORDER — AMOXICILLIN 500 MG PO CAPS: 500.0000 mg | ORAL_CAPSULE | Freq: Two times a day (BID) | ORAL | 0 refills | Status: AC

## 2022-04-17 NOTE — Patient Instructions (Addendum)
  Keandria L Hasz, thank you for joining Carvel Getting, NP for today's virtual visit.  While this provider is not your primary care provider (PCP), if your PCP is located in our provider database this encounter information will be shared with them immediately following your visit.  Consent: (Patient) Gina Gomez provided verbal consent for this virtual visit at the beginning of the encounter.  Current Medications:  Current Outpatient Medications:    amoxicillin (AMOXIL) 500 MG capsule, Take 1 capsule (500 mg total) by mouth 2 (two) times daily for 10 days., Disp: 20 capsule, Rfl: 0   busPIRone (BUSPAR) 5 MG tablet, Take by mouth., Disp: , Rfl:    sertraline (ZOLOFT) 50 MG tablet, Take 1 tablet by mouth daily., Disp: , Rfl:    Medications ordered in this encounter:  Meds ordered this encounter  Medications   amoxicillin (AMOXIL) 500 MG capsule    Sig: Take 1 capsule (500 mg total) by mouth 2 (two) times daily for 10 days.    Dispense:  20 capsule    Refill:  0     *If you need refills on other medications prior to your next appointment, please contact your pharmacy*  Follow-Up: Call back or seek an in-person evaluation if the symptoms worsen or if the condition fails to improve as anticipated.  Other Instructions Finish all the antibiotics, even if you are feeling better.  You can use throat lozenges or gargle with salt water to help your throat pain.  You can also use Tylenol or ibuprofen as directed on the bottles to help with pain and fever.  Change her toothbrush in a couple of days after starting the antibiotics to prevent reinfection.   If you have been instructed to have an in-person evaluation today at a local Urgent Care facility, please use the link below. It will take you to a list of all of our available [facility], including address, phone number and hours of operation. Please do not delay care.  [facility]  If you or a family member do not  have a primary care provider, use the link below to schedule a visit and establish care. When you choose a [facility] primary care physician or advanced practice provider, you gain a long-term partner in health. Find a Primary Care Provider  Learn more about [facility]'s in-office and virtual care options: [facility] Now

## 2022-04-17 NOTE — Progress Notes (Signed)
Virtual Visit Consent   Marisa Sprinkles Loss, you are scheduled for a virtual visit with a [facility] provider today. Just as with appointments in the office, your consent must be obtained to participate. Your consent will be active for this visit and any virtual visit you may have with one of our providers in the next 365 days. If you have a MyChart account, a copy of this consent can be sent to you electronically.  As this is a virtual visit, video technology does not allow for your provider to perform a traditional examination. This may limit your provider's ability to fully assess your condition. If your provider identifies any concerns that need to be evaluated in person or the need to arrange testing (such as labs, EKG, etc.), we will make arrangements to do so. Although advances in technology are sophisticated, we cannot ensure that it will always work on either your end or our end. If the connection with a video visit is poor, the visit may have to be switched to a telephone visit. With either a video or telephone visit, we are not always able to ensure that we have a secure connection.  By engaging in this virtual visit, you consent to the provision of healthcare and authorize for your insurance to be billed (if applicable) for the services provided during this visit. Depending on your insurance coverage, you may receive a charge related to this service.  I need to obtain your verbal consent now. Are you willing to proceed with your visit today? Nashly L Raysor has provided verbal consent on 04/17/2022 for a virtual visit (video or telephone). Cathlyn Parsons, NP  Date: 04/17/2022 8:12 AM  Virtual Visit via Video Note   I, Cathlyn Parsons, connected with  Iceis Knab [place]  ([id number], 1995-12-20) on 04/17/22 at  8:15 AM EDT by a video-enabled telemedicine application and verified that I am speaking with the correct person using two identifiers.  Location: Patient: Virtual Visit Location Patient:  Other: car Provider: Virtual Visit Location Provider: Home Office   I discussed the limitations of evaluation and management by telemedicine and the availability of in person appointments. The patient expressed understanding and agreed to proceed.    History of Present Illness: Shaquia Berkley Iyer is a 26 y.o. who identifies as a female who was assigned female at birth, and is being seen today for sore throat.  She developed a sore throat 2 days ago that is progressively worsened.  She reports fever and chills but did not take her temperature, she just reports being hot and sweaty alternating with having chills.  She looked in her throat this morning and her throat is red and she has pus pockets on her tonsils and throat.  She also has myalgias.  She has tenderness in her submandibular lymph node area.  She denies any other symptoms including nasal congestion or cough.  She does not know of any known strep exposure but she does believe she has strep throat.  She has had strep in the past but the last time was several years ago.  HPI: HPI  Problems:  Patient Active Problem List   Diagnosis Date Noted   Gestational diabetes mellitus (GDM) affecting second pregnancy 10/15/2021   Spontaneous vaginal delivery    Encounter for induction of labor    Labor and delivery, indication for care 10/08/2021   Obesity BMI=39.7 01/02/2020    Allergies: No Known Allergies Medications:  Current Outpatient Medications:    amoxicillin (AMOXIL)  500 MG capsule, Take 1 capsule (500 mg total) by mouth 2 (two) times daily for 10 days., Disp: 20 capsule, Rfl: 0   busPIRone (BUSPAR) 5 MG tablet, Take by mouth., Disp: , Rfl:    sertraline (ZOLOFT) 50 MG tablet, Take 1 tablet by mouth daily., Disp: , Rfl:   Observations/Objective: Patient is well-developed, well-nourished in no acute distress.  Resting comfortably in her car parked in [place].  Head is normocephalic, atraumatic.  No labored breathing.  Speech is  clear and coherent with logical content.  Patient is alert and oriented at baseline.    Assessment and Plan: 1. Strep pharyngitis  Prescribed amoxicillin.  Reviewed supportive care measures.  Follow Up Instructions: I discussed the assessment and treatment plan with the patient. The patient was provided an opportunity to ask questions and all were answered. The patient agreed with the plan and demonstrated an understanding of the instructions.  A copy of instructions were sent to the patient via MyChart unless otherwise noted below.   The patient was advised to call back or seek an in-person evaluation if the symptoms worsen or if the condition fails to improve as anticipated.  Time:  I spent 8 minutes with the patient via telehealth technology discussing the above problems/concerns.    Cathlyn Parsons, NP

## 2022-07-15 ENCOUNTER — Ambulatory Visit: Payer: Self-pay | Admitting: Dermatology

## 2022-09-05 ENCOUNTER — Telehealth: Payer: Medicaid Other | Admitting: Physician Assistant

## 2022-09-05 DIAGNOSIS — R3989 Other symptoms and signs involving the genitourinary system: Secondary | ICD-10-CM

## 2022-09-05 MED ORDER — CEPHALEXIN 500 MG PO CAPS: 500.0000 mg | ORAL_CAPSULE | Freq: Two times a day (BID) | ORAL | 0 refills | Status: AC

## 2022-09-05 NOTE — Patient Instructions (Signed)
Gina Gomez, thank you for joining [name], PA-C for today's virtual visit.  While this provider is not your primary care provider (PCP), if your PCP is located in our provider database this encounter information will be shared with them immediately following your visit.  Consent: (Patient) Gina Gomez provided verbal consent for this virtual visit at the beginning of the encounter.  Current Medications:  Current Outpatient Medications:    busPIRone (BUSPAR) 5 MG tablet, Take by mouth., Disp: , Rfl:    sertraline (ZOLOFT) 50 MG tablet, Take 1 tablet by mouth daily., Disp: , Rfl:    Medications ordered in this encounter:  No orders of the defined types were placed in this encounter.    *If you need refills on other medications prior to your next appointment, please contact your pharmacy*  Follow-Up: Call back or seek an in-person evaluation if the symptoms worsen or if the condition fails to improve as anticipated.  [facility] [phone]  Other Instructions Your symptoms are consistent with a bladder infection, also called acute cystitis. Please take your antibiotic (Keflex) as directed until all pills are gone.  Stay very well hydrated.  Consider a daily probiotic (Align, Culturelle, or Activia) to help prevent stomach upset caused by the antibiotic.  Taking a probiotic daily may also help prevent recurrent UTIs.  Also consider taking AZO (Phenazopyridine) tablets to help decrease pain with urination.     Urinary Tract Infection A urinary tract infection (UTI) can occur any place along the urinary tract. The tract includes the kidneys, ureters, bladder, and urethra. A type of germ called bacteria often causes a UTI. UTIs are often helped with antibiotic medicine.  HOME CARE  If given, take antibiotics as told by your doctor. Finish them even if you start to feel better. Drink enough fluids to keep your pee (urine) clear or pale yellow. Avoid tea,  drinks with caffeine, and bubbly (carbonated) drinks. Pee often. Avoid holding your pee in for a long time. Pee before and after having sex (intercourse). Wipe from front to back after you poop (bowel movement) if you are a woman. Use each tissue only once. GET HELP RIGHT AWAY IF:  You have back pain. You have lower belly (abdominal) pain. You have chills. You feel sick to your stomach (nauseous). You throw up (vomit). Your burning or discomfort with peeing does not go away. You have a fever. Your symptoms are not better in 3 days. MAKE SURE YOU:  Understand these instructions. Will watch your condition. Will get help right away if you are not doing well or get worse. Document Released: 05/04/2008 Document Revised: 08/10/2012 Document Reviewed: 06/16/2012 [facility] [employer] Patient Information 2015 [place], [place]. This information is not intended to replace advice given to you by your health care provider. Make sure you discuss any questions you have with your health care provider.    If you have been instructed to have an in-person evaluation today at a local Urgent Care facility, please use the link below. It will take you to a list of all of our available [facility], including address, phone number and hours of operation. Please do not delay care.  [facility]  If you or a family member do not have a primary care provider, use the link below to schedule a visit and establish care. When you choose a [facility] primary care physician or advanced practice provider, you gain a long-term partner in health. Find  a Primary Care Provider  Learn more about [facility]'s in-office and virtual care options: [facility] Now

## 2022-09-05 NOTE — Progress Notes (Signed)
Virtual Visit Consent   [place], you are scheduled for a virtual visit with a [facility] provider today. Just as with appointments in the office, your consent must be obtained to participate. Your consent will be active for this visit and any virtual visit you may have with one of our providers in the next 365 days. If you have a MyChart account, a copy of this consent can be sent to you electronically.  As this is a virtual visit, video technology does not allow for your provider to perform a traditional examination. This may limit your provider's ability to fully assess your condition. If your provider identifies any concerns that need to be evaluated in person or the need to arrange testing (such as labs, EKG, etc.), we will make arrangements to do so. Although advances in technology are sophisticated, we cannot ensure that it will always work on either your end or our end. If the connection with a video visit is poor, the visit may have to be switched to a telephone visit. With either a video or telephone visit, we are not always able to ensure that we have a secure connection.  By engaging in this virtual visit, you consent to the provision of healthcare and authorize for your insurance to be billed (if applicable) for the services provided during this visit. Depending on your insurance coverage, you may receive a charge related to this service.  I need to obtain your verbal consent now. Are you willing to proceed with your visit today? Gina Gomez has provided verbal consent on 09/05/2022 for a virtual visit (video or telephone). [name], [place]  Date: 09/05/2022 3:34 PM  Virtual Visit via Video Note   I, [name], connected with  [place]  ([id number], [birth year]) on 09/05/22 at  3:30 PM EDT by a video-enabled telemedicine application and verified that I am speaking with the correct person using two identifiers.  Location: Patient: Virtual Visit Location  Patient: Home Provider: Virtual Visit Location Provider: Home Office   I discussed the limitations of evaluation and management by telemedicine and the availability of in person appointments. The patient expressed understanding and agreed to proceed.    History of Present Illness: Gina Gomez is a 26 y.o. who identifies as a female who was assigned female at birth, and is being seen today for possible UTI. Notes urinary urgency, frequency, back pain. Denies fever, chiulls, nausea or vomiting, flank pain. Denies vaginal symptoms or concern for STI or pregnancy.    HPI: HPI  Problems:  Patient Active Problem List   Diagnosis Date Noted   Gestational diabetes mellitus (GDM) affecting second pregnancy 10/15/2021   Spontaneous vaginal delivery    Encounter for induction of labor    Labor and delivery, indication for care 10/08/2021   Obesity BMI=39.7 01/02/2020    Allergies: No Known Allergies Medications:  Current Outpatient Medications:    cephALEXin (KEFLEX) 500 MG capsule, Take 1 capsule (500 mg total) by mouth 2 (two) times daily for 7 days., Disp: 14 capsule, Rfl: 0  Observations/Objective: Patient is well-developed, well-nourished in no acute distress.  Resting comfortably at home.  Head is normocephalic, atraumatic.  No labored breathing. Speech is clear and coherent with logical content.  Patient is alert and oriented at baseline.   Assessment and Plan: 1. Suspected UTI - cephALEXin (KEFLEX) 500 MG capsule; Take 1 capsule (500 mg total) by mouth 2 (two) times daily for 7 days.  Dispense: 14  capsule; Refill: 0  Classic UTI symptoms with absence of alarm signs or symptoms. Prior history of UTI. Will treat empirically with Keflex for suspected uncomplicated cystitis. Supportive measures and OTC medications reviewed. Strict in-person evaluation precautions discussed.    Follow Up Instructions: I discussed the assessment and treatment plan with the patient. The patient was  provided an opportunity to ask questions and all were answered. The patient agreed with the plan and demonstrated an understanding of the instructions.  A copy of instructions were sent to the patient via MyChart unless otherwise noted below.   The patient was advised to call back or seek an in-person evaluation if the symptoms worsen or if the condition fails to improve as anticipated.  Time:  I spent 8 minutes with the patient via telehealth technology discussing the above problems/concerns.    Piedad Climes, PA-C

## 2022-11-15 ENCOUNTER — Telehealth: Payer: Medicaid Other | Admitting: Family Medicine

## 2022-11-15 DIAGNOSIS — J020 Streptococcal pharyngitis: Secondary | ICD-10-CM | POA: Diagnosis not present

## 2022-11-15 MED ORDER — AMOXICILLIN 875 MG PO TABS: 875.0000 mg | ORAL_TABLET | Freq: Two times a day (BID) | ORAL | 0 refills | Status: AC

## 2022-11-15 NOTE — Progress Notes (Signed)
Virtual Visit Consent   Gina Gomez, you are scheduled for a virtual visit with a [facility] provider today. Just as with appointments in the office, your consent must be obtained to participate. Your consent will be active for this visit and any virtual visit you may have with one of our providers in the next 365 days. If you have a MyChart account, a copy of this consent can be sent to you electronically.  As this is a virtual visit, video technology does not allow for your provider to perform a traditional examination. This may limit your provider's ability to fully assess your condition. If your provider identifies any concerns that need to be evaluated in person or the need to arrange testing (such as labs, EKG, etc.), we will make arrangements to do so. Although advances in technology are sophisticated, we cannot ensure that it will always work on either your end or our end. If the connection with a video visit is poor, the visit may have to be switched to a telephone visit. With either a video or telephone visit, we are not always able to ensure that we have a secure connection.  By engaging in this virtual visit, you consent to the provision of healthcare and authorize for your insurance to be billed (if applicable) for the services provided during this visit. Depending on your insurance coverage, you may receive a charge related to this service.  I need to obtain your verbal consent now. Are you willing to proceed with your visit today? Gina Gomez has provided verbal consent on 11/15/2022 for a virtual visit (video or telephone). [name], FNP  Date: 11/15/2022 10:01 AM  Virtual Visit via Video Note   I, [name], connected with  Gina Gomez  ([id number], February 15, 1996) on 11/15/22 at 10:00 AM EST by a video-enabled telemedicine application and verified that I am speaking with the correct person using two identifiers.  Location: Patient: Virtual Visit Location Patient:  Home Provider: Virtual Visit Location Provider: Home Office   I discussed the limitations of evaluation and management by telemedicine and the availability of in person appointments. The patient expressed understanding and agreed to proceed.    History of Present Illness: Gina Gomez is a 26 y.o. who identifies as a female who was assigned female at birth, and is being seen today for sore throat and hoarseness since Friday night. Her daughter has strep throat. No fever. In no distress and she is swallowing without problems. Marland Kitchen  HPI: HPI  Problems:  Patient Active Problem List   Diagnosis Date Noted   Gestational diabetes mellitus (GDM) affecting second pregnancy 10/15/2021   Spontaneous vaginal delivery    Encounter for induction of labor    Labor and delivery, indication for care 10/08/2021   Obesity BMI=39.7 01/02/2020    Allergies: No Known Allergies Medications:  Current Outpatient Medications:    amoxicillin (AMOXIL) 875 MG tablet, Take 1 tablet (875 mg total) by mouth 2 (two) times daily for 10 days., Disp: 20 tablet, Rfl: 0  Observations/Objective: Patient is well-developed, well-nourished in no acute distress.  Resting comfortably at home.  Head is normocephalic, atraumatic.  No labored breathing.  Speech is clear and coherent with logical content.  Patient is alert and oriented at baseline.    Assessment and Plan: 1. Strep pharyngitis  Increase fluids, warm salt water gargles, tylenol or ibuprofen as directed, urgent care if sx persist or worsen.   Follow Up Instructions: I discussed the assessment  and treatment plan with the patient. The patient was provided an opportunity to ask questions and all were answered. The patient agreed with the plan and demonstrated an understanding of the instructions.  A copy of instructions were sent to the patient via MyChart unless otherwise noted below.     The patient was advised to call back or seek an in-person evaluation if  the symptoms worsen or if the condition fails to improve as anticipated.  Time:  I spent 8 minutes with the patient via telehealth technology discussing the above problems/concerns.    [name], FNP

## 2022-11-15 NOTE — Patient Instructions (Signed)

## 2022-11-30 NOTE — L&D Delivery Note (Addendum)
Delivery Note  First Stage: Labor onset: 0320 Augmentation : pitocin, AROM Analgesia /Anesthesia intrapartum: attempted epidural placement, did not get any relief from it AROM at 0320  Second Stage: Complete dilation at 0559 Onset of pushing at unknown FHR second stage unknown, was taken off fetal monitoring by RN for epidural placement  Delivery of a viable female infant 10/01/2023 at 0602 by Laymond Purser. Infant delivered prior to CNM being called for delivery. Infant skin-to-skin with mother upon CNM arrival to the room approximately after delivery, being attended to by nursery. Cord double clamped after cessation of pulsation, cut by FOB Cord blood sample collected   Third Stage: Placenta delivered Schultz intact with 3 VC @ 0622, trailing membranes sheered off the placenta. Ring forceps were applied to the membranes and twisted until the membranes came out. Placenta membranes appear complete. Placenta disposition: discarded Uterine tone firm / bleeding scant  1st degree perineal laceration identified  Anesthesia for repair: none Repair not indicated, tear hemostatic and approximated Est. Blood Loss (mL):  Complications: precipitous delivery  Mom to postpartum.  Baby to Couplet care / Skin to Skin.  Newborn: Birth Weight: 5lb 9.2oz  Apgar Scores: 8, 9 Feeding planned: breastfeeding

## 2023-03-25 DIAGNOSIS — O0993 Supervision of high risk pregnancy, unspecified, third trimester: Secondary | ICD-10-CM | POA: Insufficient documentation

## 2023-04-02 LAB — OB RESULTS CONSOLE RUBELLA ANTIBODY, IGM: Rubella: NON-IMMUNE/NOT IMMUNE

## 2023-04-02 LAB — OB RESULTS CONSOLE HEPATITIS B SURFACE ANTIGEN: Hepatitis B Surface Ag: NEGATIVE

## 2023-04-02 LAB — OB RESULTS CONSOLE HIV ANTIBODY (ROUTINE TESTING): HIV: NONREACTIVE

## 2023-04-02 LAB — OB RESULTS CONSOLE VARICELLA ZOSTER ANTIBODY, IGG: Varicella: IMMUNE

## 2023-04-02 LAB — OB RESULTS CONSOLE RPR: RPR: NONREACTIVE

## 2023-04-17 IMAGING — US US OB COMP +14 WK
1 series · 13 of 28 positions shown · non-contrast
Comparison: none

CLINICAL DATA: Scan for anatomy. Patient is 21 weeks 2 days by
assigned EDC of 10/22/2021.

EXAM:
OBSTETRICAL ULTRASOUND >14 WKS

[Series 1: us ob comp + 14 wk · 13 of 75 slices shown]
[im 3/75]
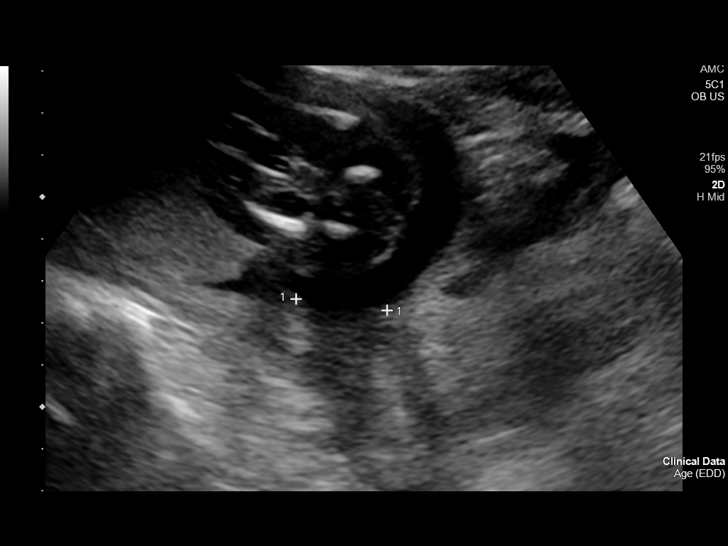
[im 9/75]
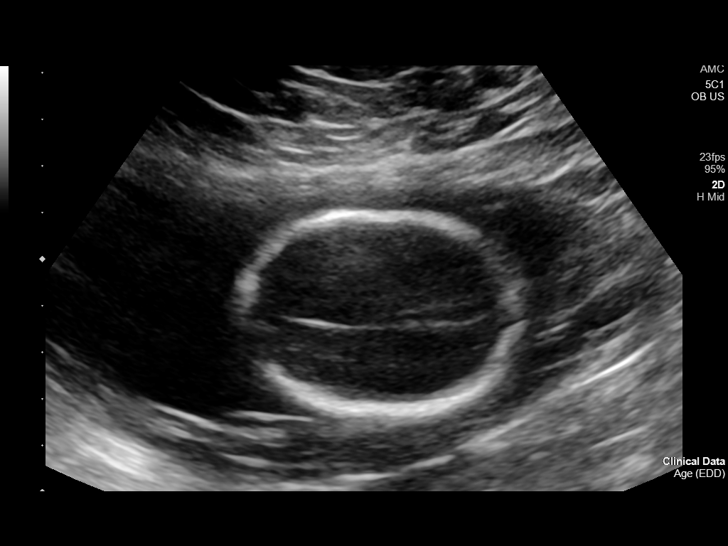
[im 14/75]
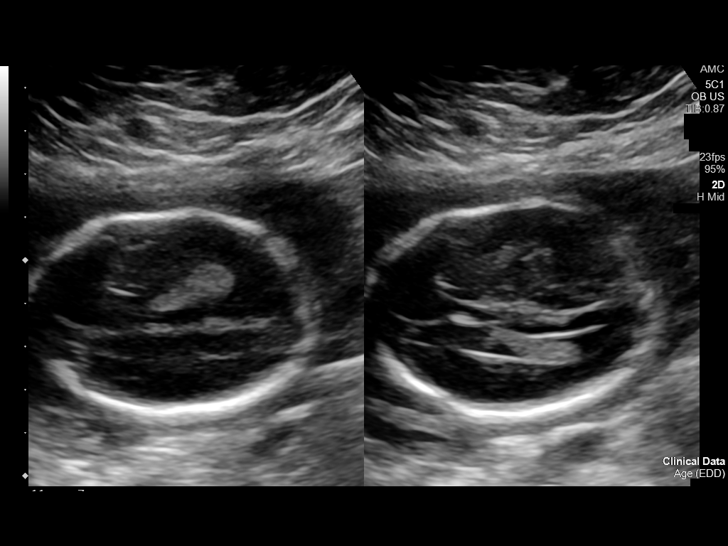
[im 20/75]
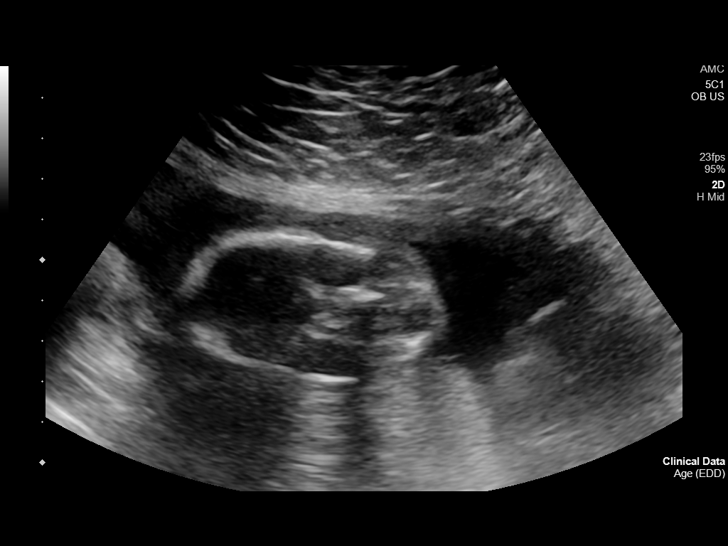
[im 25/75]
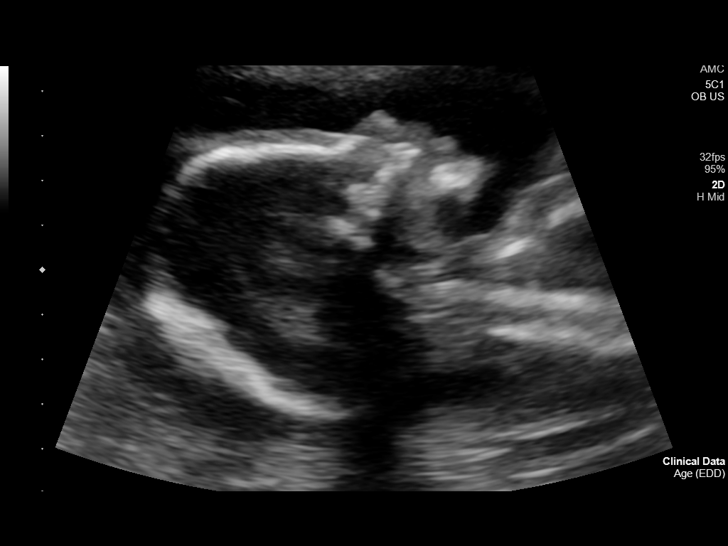
[im 31/75]
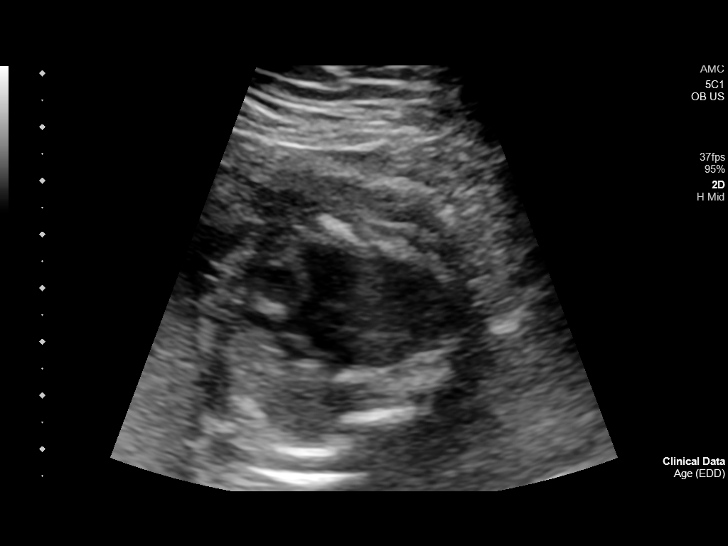
[im 39/75]
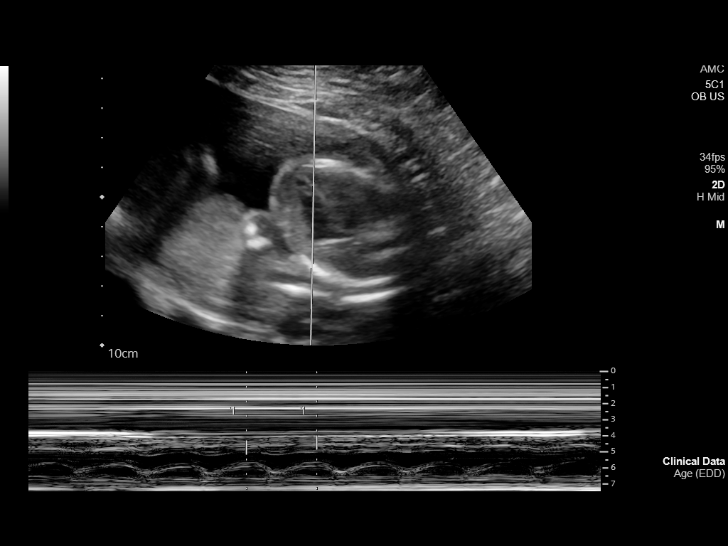
[im 44/75]
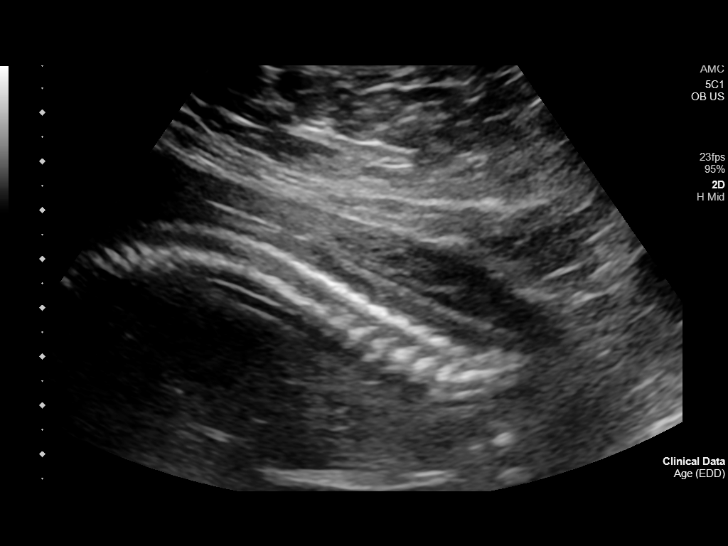
[im 50/75]
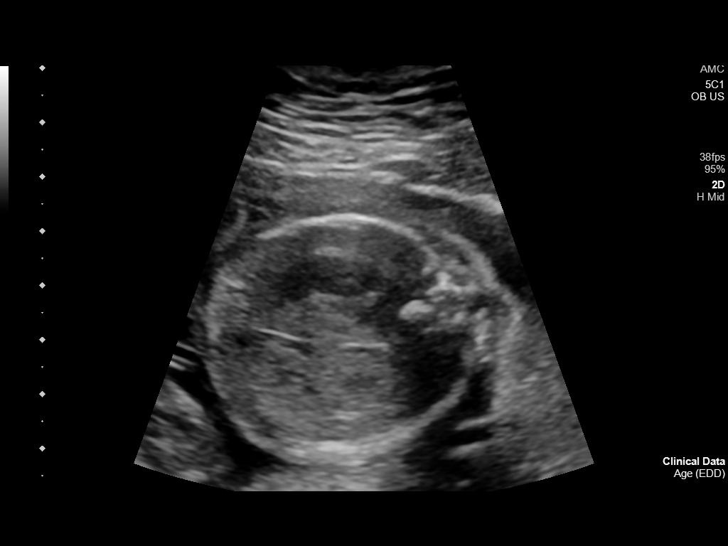
[im 55/75]
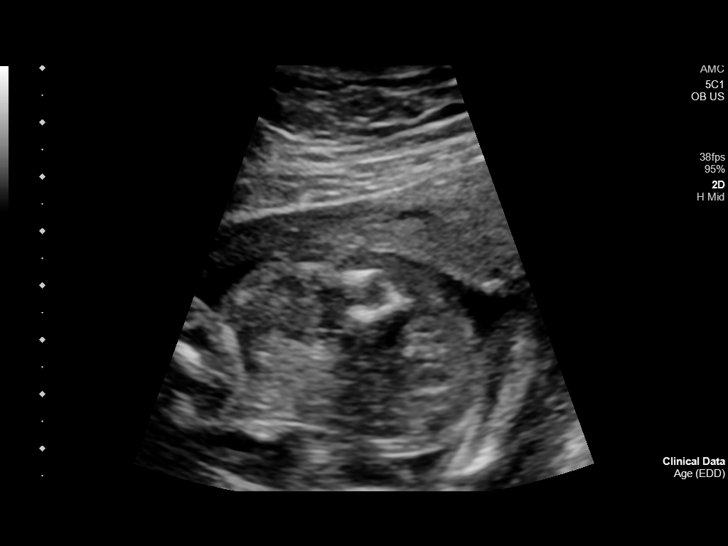
[im 61/75]
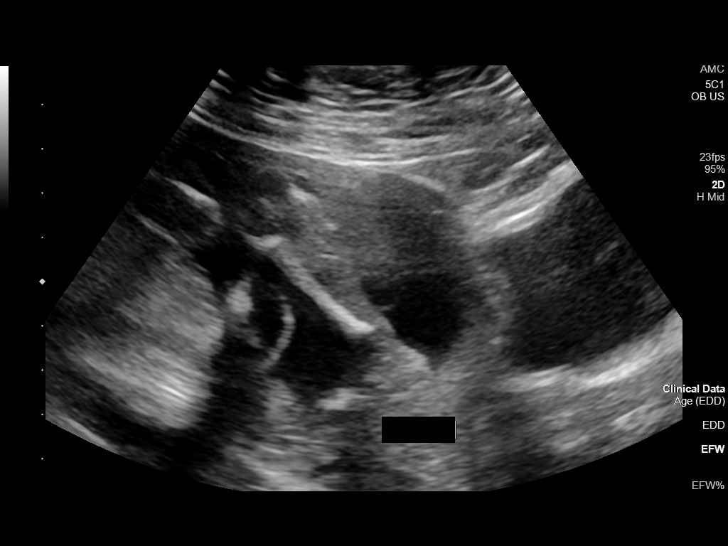
[im 66/75]
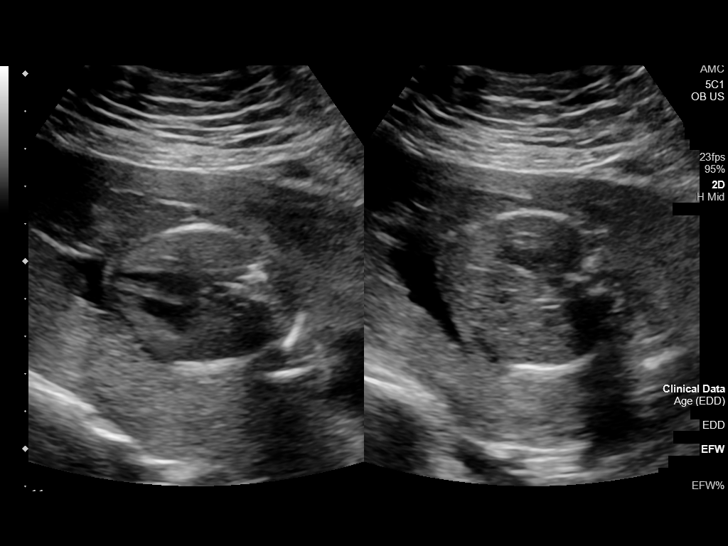
[im 72/75]
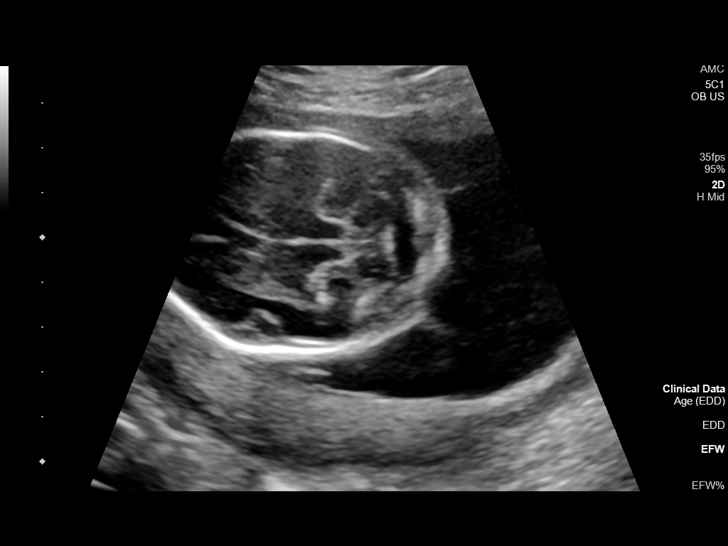

[13 of 28 positions shown; findings below may reference images not displayed]

FINDINGS: Number of Fetuses: 1

Heart Rate:  145 bpm

Movement: Present

Presentation: Breech

Previa: None

Placental Location: Posterior

Amniotic Fluid (Subjective): Normal

Amniotic Fluid (Objective):

Vertical pocket = 5.4cm

FETAL BIOMETRY

BPD: 4.7cm 20w 2d

HC:   18.6cm 21w 0d

AC:   16.0cm 21w 0d

FL:   3.6cm 21w 3d

Current Mean GA: 21w 0d US EDC: 10/24/2021

Assigned GA:  21w 2d Assigned EDC: 10/22/2021

FETAL ANATOMY

Lateral Ventricles: Appears normal

Thalami/CSP: Appears normal

Posterior Fossa:  Appears normal

Nuchal Region: Appears normal   NFT= 3.8 millimeters

Upper Lip: Appears normal

Spine: Appears normal

4 Chamber Heart on Left: Appears normal

LVOT: Appears normal

RVOT: Appears normal

Stomach on Left: Appears normal

3 Vessel Cord: Appears normal

Cord Insertion site: Appears normal

Kidneys: Appears normal

Bladder: Appears normal

Extremities: Appears normal

Technically difficult due to: Maternal body habitus

Maternal Findings:

Cervix:  Cervix is 4.0 centimeters on transabdominal evaluation.
IMPRESSION: 1. Single living intrauterine fetus in breech presentation.
2. Size and dates correlate well.
3. Normal amniotic fluid volume.
4. Normal anatomic survey.

## 2023-08-16 ENCOUNTER — Ambulatory Visit: Payer: Medicaid Other | Admitting: Dietician

## 2023-08-16 ENCOUNTER — Encounter: Payer: Self-pay | Admitting: Obstetrics and Gynecology

## 2023-08-16 ENCOUNTER — Observation Stay
Admission: EM | Admit: 2023-08-16 | Discharge: 2023-08-17 | Disposition: A | Discharge status: Home / Self Care | Payer: Medicaid Other | Attending: Obstetrics and Gynecology | Admitting: Obstetrics and Gynecology

## 2023-08-16 ENCOUNTER — Other Ambulatory Visit: Payer: Self-pay

## 2023-08-16 DIAGNOSIS — O09523 Supervision of elderly multigravida, third trimester: Secondary | ICD-10-CM | POA: Diagnosis not present

## 2023-08-16 DIAGNOSIS — Z3A30 30 weeks gestation of pregnancy: Secondary | ICD-10-CM | POA: Diagnosis not present

## 2023-08-16 DIAGNOSIS — E669 Obesity, unspecified: Secondary | ICD-10-CM | POA: Diagnosis not present

## 2023-08-16 DIAGNOSIS — O99013 Anemia complicating pregnancy, third trimester: Secondary | ICD-10-CM | POA: Diagnosis not present

## 2023-08-16 DIAGNOSIS — W19XXXA Unspecified fall, initial encounter: Secondary | ICD-10-CM | POA: Diagnosis not present

## 2023-08-16 DIAGNOSIS — M549 Dorsalgia, unspecified: Secondary | ICD-10-CM | POA: Diagnosis not present

## 2023-08-16 DIAGNOSIS — O9A213 Injury, poisoning and certain other consequences of external causes complicating pregnancy, third trimester: Secondary | ICD-10-CM | POA: Diagnosis present

## 2023-08-16 DIAGNOSIS — O99213 Obesity complicating pregnancy, third trimester: Secondary | ICD-10-CM | POA: Insufficient documentation

## 2023-08-16 DIAGNOSIS — S3991XA Unspecified injury of abdomen, initial encounter: Secondary | ICD-10-CM | POA: Diagnosis not present

## 2023-08-16 DIAGNOSIS — Y92009 Unspecified place in unspecified non-institutional (private) residence as the place of occurrence of the external cause: Principal | ICD-10-CM

## 2023-08-16 LAB — COMPREHENSIVE METABOLIC PANEL
ALT: 11 U/L (ref 0–44)
AST: 12 U/L — ABNORMAL LOW (ref 15–41)
Albumin: 3.1 g/dL — ABNORMAL LOW (ref 3.5–5.0)
Alkaline Phosphatase: 89 U/L (ref 38–126)
Anion gap: 10 (ref 5–15)
BUN: 6 mg/dL (ref 6–20)
CO2: 23 mmol/L (ref 22–32)
Calcium: 8.6 mg/dL — ABNORMAL LOW (ref 8.9–10.3)
Chloride: 104 mmol/L (ref 98–111)
Creatinine, Ser: 0.45 mg/dL (ref 0.44–1.00)
GFR, Estimated: >60 mL/min (ref >60)
Glucose, Bld: 114 mg/dL — ABNORMAL HIGH (ref 70–99)
Potassium: 4 mmol/L (ref 3.5–5.1)
Sodium: 137 mmol/L (ref 135–145)
Total Bilirubin: 0.4 mg/dL (ref 0.3–1.2)
Total Protein: 6.9 g/dL (ref 6.5–8.1)

## 2023-08-16 LAB — CBC
HCT: 34.3 % — ABNORMAL LOW (ref 36.0–46.0)
Hemoglobin: 10.9 g/dL — ABNORMAL LOW (ref 12.0–15.0)
MCH: 26.6 pg (ref 26.0–34.0)
MCHC: 31.8 g/dL (ref 30.0–36.0)
MCV: 83.7 fL (ref 80.0–100.0)
Platelets: 223 10*3/uL (ref 150–400)
RBC: 4.1 MIL/uL (ref 3.87–5.11)
RDW: 15.2 % (ref 11.5–15.5)
WBC: 12.9 10*3/uL — ABNORMAL HIGH (ref 4.0–10.5)
nRBC: 0 % (ref 0.0–0.2)

## 2023-08-16 LAB — PROTEIN / CREATININE RATIO, URINE
Creatinine, Urine: 21 mg/dL
Total Protein, Urine: <6 mg/dL

## 2023-08-16 MED ORDER — ACETAMINOPHEN 500 MG PO TABS
ORAL_TABLET | ORAL | Status: AC
  Administered 2023-08-16: 1000 mg via ORAL
  Filled 2023-08-16: qty 2

## 2023-08-16 MED ORDER — ACETAMINOPHEN 500 MG PO TABS: 1000.0000 mg | ORAL_TABLET | Freq: Once | ORAL | Status: AC

## 2023-08-16 NOTE — Progress Notes (Signed)
Gina Gomez 27 y.o. is a G3P1002 at 30wk3d (per patient report) presents to Labor & Delivery triage via wheelchair steered by ED staff reporting back pain radiating to her abdomen since 5pm today. She denies leaking of fluid or vaginal bleeding. Patient reports +FM and is unsure about feeling ctx. External FM and TOCO applied to abdomen. Initial FHR 140 bpm. Vital signs obtained and within normal limits. Patient oriented to care environment including call bell and bed control use. [place], CNM notified of patient's arrival.

## 2023-08-16 NOTE — Discharge Summary (Signed)
Patient ID: Gina Gomez MRN: [id number] DOB/AGE: 1996-09-09 27 y.o.  Admit date: 08/16/2023 Discharge date: 08/17/2023  Admission Diagnoses: 27yo G3P2 at [redacted]w[redacted]d presents after a fall at home around 1700.  She was able to catch herself so she did not hit the ground, however since her fall she reports back and abdominal pain. She took a bath with no relief.  Pt denies contractions, VB, or LOF.  Discharge Diagnoses: Fall at home  Factors complicating pregnancy: GDM Anemia in pregnancy  Obesity BMI: 43  Prenatal Procedures: NST  Consults: None  Significant Diagnostic Studies:  Results for orders placed or performed during the hospital encounter of 08/16/23 (from the past 168 hour(s))  Protein / creatinine ratio, urine   Collection Time: 08/16/23 10:35 PM  Result Value Ref Range   Creatinine, Urine 21 mg/dL   Total Protein, Urine <6 mg/dL   Protein Creatinine Ratio        0.00 - 0.15 mg/mg[Cre]  CBC   Collection Time: 08/16/23 11:04 PM  Result Value Ref Range   WBC 12.9 (H) 4.0 - 10.5 K/uL   RBC 4.10 3.87 - 5.11 MIL/uL   Hemoglobin 10.9 (L) 12.0 - 15.0 g/dL   HCT 40.9 (L) 81.1 - 91.4 %   MCV 83.7 80.0 - 100.0 fL   MCH 26.6 26.0 - 34.0 pg   MCHC 31.8 30.0 - 36.0 g/dL   RDW 78.2 95.6 - 21.3 %   Platelets 223 150 - 400 K/uL   nRBC 0.0 0.0 - 0.2 %  Comprehensive metabolic panel   Collection Time: 08/16/23 11:04 PM  Result Value Ref Range   Sodium 137 135 - 145 mmol/L   Potassium 4.0 3.5 - 5.1 mmol/L   Chloride 104 98 - 111 mmol/L   CO2 23 22 - 32 mmol/L   Glucose, Bld 114 (H) 70 - 99 mg/dL   BUN 6 6 - 20 mg/dL   Creatinine, Ser 0.86 0.44 - 1.00 mg/dL   Calcium 8.6 (L) 8.9 - 10.3 mg/dL   Total Protein 6.9 6.5 - 8.1 g/dL   Albumin 3.1 (L) 3.5 - 5.0 g/dL   AST 12 (L) 15 - 41 U/L   ALT 11 0 - 44 U/L   Alkaline Phosphatase 89 38 - 126 U/L   Total Bilirubin 0.4 0.3 - 1.2 mg/dL   GFR, Estimated >57 >84 mL/min   Anion gap 10 5 - 15    Treatments: none  Hospital Course:   This is a 27 y.o. G3P1002 with IUP at [redacted]w[redacted]d seen after a fall at home, resulting in abdominal and back pain.  No leaking of fluid and no bleeding and no contractions.  NST was reactive, toco quiet.  Upon arrival Pt's BPs were elevated, pre-e labs collected (results noted above).     She was observed, fetal heart rate monitoring remained reassuring, and she had no signs/symptoms of progressing preterm labor or other maternal-fetal concerns.  Her cervical exam was unchanged from admission.  She was deemed stable for discharge to home with outpatient follow up.  Discharge Physical Exam:  BP 122/66   Pulse (!) 101   Temp 97.6 F (36.4 C) (Oral)   Vitals:   08/16/23 2202 08/16/23 2228 08/16/23 2301 08/16/23 2316  BP: (!) 142/85 125/69 (!) 145/73 113/64   08/16/23 2330  BP: 122/66     NST: FHR baseline: 140 bpm Variability: moderate Accelerations: yes Decelerations: none Category/reactivity: reactive   TOCO: quiet SVE: deferred      Discharge  Condition: Stable  Disposition: Discharge disposition: 01-Home or Self Care        Allergies as of 08/17/2023   No Known Allergies      Medication List     TAKE these medications    ferrous sulfate 324 MG Tbec Take 324 mg by mouth daily with breakfast.        Follow-up Information     [facility] Follow up.   Why: Keep all scheduled appointments Contact information: [address] [place] [address] [phone]                Signed:  Quillian Quince 08/17/2023 7:32 AM

## 2023-08-17 NOTE — Progress Notes (Signed)
Patient discharged home per provider order. After Visit Summary was printed and given to the patient. Discharge education completed with patient and support person including follow up instructions, appointments, and medication list. She received labor and bleeding precautions. Patient verbalized understanding and voiced no questions at this time. Patient instructed to return to ED, call 911, or call provider for any changes in condition. Patient discharged home via personal vehicle with support person.

## 2023-09-16 ENCOUNTER — Observation Stay: Payer: Medicaid Other

## 2023-09-16 ENCOUNTER — Other Ambulatory Visit: Payer: Self-pay

## 2023-09-16 ENCOUNTER — Observation Stay
Admission: EM | Admit: 2023-09-16 | Discharge: 2023-09-16 | Disposition: A | Discharge status: Home / Self Care | Payer: Medicaid Other | Attending: Obstetrics | Admitting: Obstetrics and Gynecology

## 2023-09-16 DIAGNOSIS — O24419 Gestational diabetes mellitus in pregnancy, unspecified control: Secondary | ICD-10-CM | POA: Insufficient documentation

## 2023-09-16 DIAGNOSIS — O368130 Decreased fetal movements, third trimester, not applicable or unspecified: Principal | ICD-10-CM | POA: Insufficient documentation

## 2023-09-16 DIAGNOSIS — Z3A34 34 weeks gestation of pregnancy: Secondary | ICD-10-CM | POA: Diagnosis not present

## 2023-09-16 DIAGNOSIS — O288 Other abnormal findings on antenatal screening of mother: Secondary | ICD-10-CM | POA: Diagnosis present

## 2023-09-16 NOTE — Discharge Summary (Signed)
Progress Note Decreased Fetal Movement.  Subjective:   Gina Gomez is a 27 y.o. female. She is at [redacted]w[redacted]d gestation. She was seen today for a routine prenatal care visit and had a non reactive NST. She was sent over to labor and delivery for further evaluation and prolonged monitoring. . Her pregnancy has been complicated by:  Patient Active Problem List   Diagnosis Date Noted   Non-reactive NST (non-stress test) 09/16/2023   Fall at home 08/16/2023   Gestational diabetes mellitus (GDM) affecting second pregnancy 10/15/2021   Spontaneous vaginal delivery    Encounter for induction of labor    Labor and delivery, indication for care 10/08/2021   Obesity BMI=39.7 01/02/2020    PMH, PSH, POBH and problem list reviewed Medications and allergies reviewed.    Objective:   Today's Vitals   09/16/23 1310 09/16/23 1311  BP: 137/68   Pulse: (!) 128   Resp: 16   Temp: 98.1 F (36.7 C)   TempSrc: Oral   Weight: 117.9 kg   Height: 5\' 2"  (1.575 m)   PainSc:  0-No pain   Body mass index is 47.55 kg/m.  General appearance: alert, well appearing, and in no distress. External fetal monitoring:  Ultrasound  No results found for this or any previous visit (from the past 48 hour(s)).   Assessment:   Pregnancy at [redacted]w[redacted]d with concerns for decreased fetal movement. NST: Baseline FHR: 145 beats/min Variability: moderate Accelerations: present 10x10 Decelerations: absent Tocometry: quiet  Interpretation:  INDICATIONS: gestational diabetes mellitus RESULTS:  A NST procedure was performed with FHR monitoring and a normal baseline established, appropriate time of 20-40 minutes of evaluation, and accels >2 seen w 10x10 characteristics.  Results show a NON REACTIVE NST.    Chari Manning, CNM  Evaluation reveals:   nonreactive  Plan:    -Limited OB ultrasound  CLINICAL DATA:  Nonreactive stress test   EXAM: BIOPHYSICAL PROFILE   FINDINGS: Number of Fetuses: 1   Heart Rate:  141 bpm   Presentation: Cephalic   Movement: 2 time: 30 minutes   Breathing: 2   Tone: 2   Amniotic Fluid: 2   Total Score: 8/8   IMPRESSION: Normal biophysical profile, 8/8.     Electronically Signed   By: Layla Maw M.D.   On: 09/16/2023 17:52  - Patient to f/u with routine PNC and NST's as scheduled Orders placed: Orders Placed This Encounter  Procedures   US FETAL BPP WO NON STRESS    Standing Status:   Standing    Number of Occurrences:   1    Order Specific Question:   Symptom/Reason for Exam    Answer:   Non-reactive NST (non-stress test) [295621]   Fetal non-stress test    Standing Status:   Standing    Number of Occurrences:   1   Place in observation (patient's expected length of stay will be less than 2 midnights)    Standing Status:   Standing    Number of Occurrences:   1    Order Specific Question:   Hospital Area    Answer:   [facility] [[id number]]    Order Specific Question:   Level of Care    Answer:   Antepartum [20]    Order Specific Question:   Covid Evaluation    Answer:   Asymptomatic - no recent exposure (last 10 days) testing not required    Order Specific Question:   Diagnosis    Answer:  Non-reactive NST (non-stress test) [829562]    Order Specific Question:   Admitting Physician    Answer:   Christeen Douglas [[id number]]    Order Specific Question:   Attending Physician    Answer:   Francesca Oman    Patient expresses understanding of information provided and plan of care.    Chari Manning, CNM 09/16/23 5:58 PM

## 2023-09-16 NOTE — OB Triage Note (Signed)
Patient is a 27 yo, G3P2, at 34 weeks 6 days. Patient presents to L&D after being sent over from the office for a non-reactive NST.  Patient denies any vaginal bleeding or LOF. Patient denies any regular or consistent contractions. Patient reports +FM. Monitors applied and assessing. VSS. Initial fetal heart tone 155. Gina Gomez CNM aware of patients arrival to unit. Plan to admit to observation for fetal monitoring.

## 2023-09-16 NOTE — Progress Notes (Signed)
Patient transported to [place] via transporter.

## 2023-09-16 NOTE — Discharge Summary (Signed)
Patient discharged home, discharge instructions given, patient states understanding. Patient left floor in stable condition, denies any other needs at this time. Patient to call and scheduled OB appointment for 4 days.

## 2023-09-16 NOTE — Discharge Instructions (Addendum)
Call tomorrow to schedule an appointment for 4 days from now. Call your provider or return to the ER if you have any concerns.

## 2023-09-16 NOTE — Progress Notes (Signed)
Patient returned from [place].

## 2023-09-19 NOTE — Plan of Care (Signed)
CH[facility] Tonsillectomy/Adenoidectomy, Postoperative PEDS care plan entered in error.

## 2023-09-25 ENCOUNTER — Observation Stay
Admission: EM | Admit: 2023-09-25 | Discharge: 2023-09-25 | Disposition: A | Discharge status: Home / Self Care | Payer: Medicaid Other | Attending: Obstetrics and Gynecology | Admitting: Obstetrics and Gynecology

## 2023-09-25 ENCOUNTER — Other Ambulatory Visit: Payer: Self-pay

## 2023-09-25 ENCOUNTER — Encounter: Payer: Self-pay | Admitting: Obstetrics & Gynecology

## 2023-09-25 DIAGNOSIS — Z794 Long term (current) use of insulin: Secondary | ICD-10-CM | POA: Diagnosis not present

## 2023-09-25 DIAGNOSIS — O133 Gestational [pregnancy-induced] hypertension without significant proteinuria, third trimester: Principal | ICD-10-CM | POA: Insufficient documentation

## 2023-09-25 DIAGNOSIS — Z3A36 36 weeks gestation of pregnancy: Secondary | ICD-10-CM | POA: Insufficient documentation

## 2023-09-25 DIAGNOSIS — O24419 Gestational diabetes mellitus in pregnancy, unspecified control: Secondary | ICD-10-CM | POA: Diagnosis not present

## 2023-09-25 DIAGNOSIS — O26899 Other specified pregnancy related conditions, unspecified trimester: Principal | ICD-10-CM | POA: Diagnosis present

## 2023-09-25 DIAGNOSIS — O26893 Other specified pregnancy related conditions, third trimester: Secondary | ICD-10-CM | POA: Insufficient documentation

## 2023-09-25 DIAGNOSIS — R519 Headache, unspecified: Secondary | ICD-10-CM | POA: Insufficient documentation

## 2023-09-25 LAB — COMPREHENSIVE METABOLIC PANEL
ALT: 10 U/L (ref 0–44)
AST: 12 U/L — ABNORMAL LOW (ref 15–41)
Albumin: 2.9 g/dL — ABNORMAL LOW (ref 3.5–5.0)
Alkaline Phosphatase: 108 U/L (ref 38–126)
Anion gap: 5 (ref 5–15)
BUN: 8 mg/dL (ref 6–20)
CO2: 24 mmol/L (ref 22–32)
Calcium: 8.3 mg/dL — ABNORMAL LOW (ref 8.9–10.3)
Chloride: 107 mmol/L (ref 98–111)
Creatinine, Ser: 0.49 mg/dL (ref 0.44–1.00)
GFR, Estimated: >60 mL/min (ref >60)
Glucose, Bld: 85 mg/dL (ref 70–99)
Potassium: 4.1 mmol/L (ref 3.5–5.1)
Sodium: 136 mmol/L (ref 135–145)
Total Bilirubin: 0.4 mg/dL (ref 0.3–1.2)
Total Protein: 6.7 g/dL (ref 6.5–8.1)

## 2023-09-25 LAB — CBC WITH DIFFERENTIAL/PLATELET
Abs Immature Granulocytes: 0.23 10*3/uL — ABNORMAL HIGH (ref 0.00–0.07)
Basophils Absolute: 0 10*3/uL (ref 0.0–0.1)
Basophils Relative: 0 %
Eosinophils Absolute: 0.1 10*3/uL (ref 0.0–0.5)
Eosinophils Relative: 1 %
HCT: 33.9 % — ABNORMAL LOW (ref 36.0–46.0)
Hemoglobin: 10.8 g/dL — ABNORMAL LOW (ref 12.0–15.0)
Immature Granulocytes: 2 %
Lymphocytes Relative: 23 %
Lymphs Abs: 2.5 10*3/uL (ref 0.7–4.0)
MCH: 26.7 pg (ref 26.0–34.0)
MCHC: 31.9 g/dL (ref 30.0–36.0)
MCV: 83.9 fL (ref 80.0–100.0)
Monocytes Absolute: 0.8 10*3/uL (ref 0.1–1.0)
Monocytes Relative: 7 %
Neutro Abs: 7.2 10*3/uL (ref 1.7–7.7)
Neutrophils Relative %: 67 %
Platelets: 228 10*3/uL (ref 150–400)
RBC: 4.04 MIL/uL (ref 3.87–5.11)
RDW: 15.4 % (ref 11.5–15.5)
WBC: 10.9 10*3/uL — ABNORMAL HIGH (ref 4.0–10.5)
nRBC: 0 % (ref 0.0–0.2)

## 2023-09-25 LAB — PROTEIN / CREATININE RATIO, URINE
Creatinine, Urine: 256 mg/dL
Protein Creatinine Ratio: 0.26 mg/mg{creat} — ABNORMAL HIGH (ref 0.00–0.15)
Total Protein, Urine: 66 mg/dL

## 2023-09-25 MED ORDER — ACETAMINOPHEN 500 MG PO TABS
1000.0000 mg | ORAL_TABLET | Freq: Once | ORAL | Status: AC
  Administered 2023-09-25: 1000 mg via ORAL

## 2023-09-25 MED ORDER — ACETAMINOPHEN 500 MG PO TABS
ORAL_TABLET | ORAL | Status: AC
  Filled 2023-09-25: qty 2

## 2023-09-25 MED ORDER — DIPHENHYDRAMINE HCL 25 MG PO CAPS
25.0000 mg | ORAL_CAPSULE | Freq: Once | ORAL | Status: AC
  Administered 2023-09-25: 25 mg via ORAL

## 2023-09-25 MED ORDER — DIPHENHYDRAMINE HCL 25 MG PO CAPS
ORAL_CAPSULE | ORAL | Status: AC
  Filled 2023-09-25: qty 1

## 2023-09-25 NOTE — OB Triage Note (Signed)
Patient discharged home per order.  She is stable and ambulatory. An After Visit Summary was printed and given to the patient. Discharge education completed with patient and support person including follow up instructions, appointments, and medication list. She received labor and bleeding precautions. Patient able to verbalize understanding. All questions fully answered upon discharge. Patient instructed to return to ED, call 911, or call provider for any changes in condition. Patient discharged home via personal vehicle with support person and all belongings.    

## 2023-09-25 NOTE — Discharge Summary (Signed)
Gina Gomez is a 27 y.o. female. She is at [redacted]w[redacted]d gestation. No LMP recorded. Patient is pregnant. Estimated Date of Delivery: 10/22/23  Prenatal care site: [facility]    Current pregnancy complicated by:  - GDM A2 - Anemia - Obesity  Chief complaint: elevated BP at [facility], headache since noon  She reports a headache that started around noon. She went to [facility] and checked her BP and it was elevated, initially 150s/80s and on re-check was 140s/80s. She took Tylenol around 2pm and the headache improved but did not completely resolve.  S: Resting comfortably. no CTX, no VB.no LOF,  Active fetal movement.  Denies: visual changes, SOB, or RUQ/epigastric pain  Maternal Medical History:   Past Medical History:  Diagnosis Date   Gestational diabetes    Headache    migraine   Medical history non-contributory    Obesity (BMI 35.0-39.9 without comorbidity)     Past Surgical History:  Procedure Laterality Date   NO PAST SURGERIES      No Known Allergies  Prior to Admission medications   Medication Sig Start Date End Date Taking? Authorizing Provider  ferrous sulfate 324 MG TBEC Take 324 mg by mouth daily with breakfast.   Yes [provider]  insulin glargine (LANTUS) 100 UNIT/ML injection Inject 27 Units into the skin daily.   Yes [provider]    Social History: She  reports that she has never smoked. She has never used smokeless tobacco. She reports that she does not drink alcohol and does not use drugs.  Family History: family history includes Cancer in her maternal grandmother; Diabetes in her maternal grandfather; Heart disease in her maternal grandfather and paternal grandmother; High Cholesterol in her paternal grandmother; Hypertension in her maternal grandfather; Ovarian cysts in her mother.  no history of gyn cancers  Review of Systems: A full review of systems was performed and negative except as noted in the HPI.     O:  BP (!)  120/59   Pulse 100   Temp 98.3 F (36.8 C) (Oral)   Resp 18  Vitals:   09/25/23 1924 09/25/23 1939 09/25/23 1954 09/25/23 2009  BP: (!) 143/83 131/75 134/73 133/69   09/25/23 2024 09/25/23 2039 09/25/23 2054 09/25/23 2106  BP: 131/71 119/60 112/60 (!) 120/59    Results for orders placed or performed during the hospital encounter of 09/25/23 (from the past 48 hour(s))  Protein / creatinine ratio, urine   Collection Time: 09/25/23  7:56 PM  Result Value Ref Range   Creatinine, Urine 256 mg/dL   Total Protein, Urine 66 mg/dL   Protein Creatinine Ratio 0.26 (H) 0.00 - 0.15 mg/mg[Cre]  CBC with Differential/Platelet   Collection Time: 09/25/23  8:20 PM  Result Value Ref Range   WBC 10.9 (H) 4.0 - 10.5 K/uL   RBC 4.04 3.87 - 5.11 MIL/uL   Hemoglobin 10.8 (L) 12.0 - 15.0 g/dL   HCT 65.7 (L) 84.6 - 96.2 %   MCV 83.9 80.0 - 100.0 fL   MCH 26.7 26.0 - 34.0 pg   MCHC 31.9 30.0 - 36.0 g/dL   RDW 95.2 84.1 - 32.4 %   Platelets 228 150 - 400 K/uL   nRBC 0.0 0.0 - 0.2 %   Neutrophils Relative % 67 %   Neutro Abs 7.2 1.7 - 7.7 K/uL   Lymphocytes Relative 23 %   Lymphs Abs 2.5 0.7 - 4.0 K/uL   Monocytes Relative 7 %   Monocytes Absolute  0.8 0.1 - 1.0 K/uL   Eosinophils Relative 1 %   Eosinophils Absolute 0.1 0.0 - 0.5 K/uL   Basophils Relative 0 %   Basophils Absolute 0.0 0.0 - 0.1 K/uL   Immature Granulocytes 2 %   Abs Immature Granulocytes 0.23 (H) 0.00 - 0.07 K/uL  Comprehensive metabolic panel   Collection Time: 09/25/23  8:20 PM  Result Value Ref Range   Sodium 136 135 - 145 mmol/L   Potassium 4.1 3.5 - 5.1 mmol/L   Chloride 107 98 - 111 mmol/L   CO2 24 22 - 32 mmol/L   Glucose, Bld 85 70 - 99 mg/dL   BUN 8 6 - 20 mg/dL   Creatinine, Ser 4.09 0.44 - 1.00 mg/dL   Calcium 8.3 (L) 8.9 - 10.3 mg/dL   Total Protein 6.7 6.5 - 8.1 g/dL   Albumin 2.9 (L) 3.5 - 5.0 g/dL   AST 12 (L) 15 - 41 U/L   ALT 10 0 - 44 U/L   Alkaline Phosphatase 108 38 - 126 U/L   Total Bilirubin 0.4  0.3 - 1.2 mg/dL   GFR, Estimated >81 >19 mL/min   Anion gap 5 5 - 15     Constitutional: NAD, AAOx3  HE/ENT: extraocular movements grossly intact, moist mucous membranes CV: RRR PULM: nl respiratory effort, CTABL     Abd: gravid, non-tender, non-distended, soft      Ext: Non-tender, Nonedematous   Psych: mood appropriate, speech normal Pelvic: deferred   Fetal  monitoring: Cat 1 Appropriate for GA Baseline: 145bpm Variability: moderate Accelerations: present x >2 Decelerations absent  A/P: 27 y.o. 105w1d here for antenatal surveillance for PIH evaluation  Principle Diagnosis:  Elevated BP without the diagnosis of HTN, headache  Pre-eclampsia: not present, labs reassuring, one mild-range blood pressure but all the others are normal.  Headache: resolved with 1000mg  Tylenol and 25mg  Benadryl PO Labor: not present.  Fetal Wellbeing: Reassuring Cat 1 tracing. Reactive NST  D/c home stable, precautions reviewed, follow-up as scheduled.    Janyce Llanos, CNM 09/25/2023 10:44 PM

## 2023-09-25 NOTE — OB Triage Note (Signed)
Gina Gomez 27 y.o. [redacted]w[redacted]d G3P2 presents to Labor & Delivery triage via wheelchair steered by ED staff reporting 2 elevated bps checked at [facility] at 1840 of 152/93 and a repeat at 5 minutes of 148/94. She has felt "off" since around 1245. She reports a headache unresolved by Tylenol taken at 1400. She feels flushed. Denies vision changes or epigastric pain but states she has left shoulder blade pain. No clonus, +2 reflexes. She denies signs and symptoms consistent with rupture of membranes or active vaginal bleeding. She denies contractions and states positive fetal movement. External FM and TOCO applied to non-tender abdomen. Initial FHR 160. Blood pressures cycling q 15. Patient oriented to care environment including call bell and bed control use. Donato Schultz, CNM notified of patient's arrival.

## 2023-09-26 ENCOUNTER — Observation Stay
Admission: EM | Admit: 2023-09-26 | Discharge: 2023-09-27 | Disposition: A | Discharge status: Home / Self Care | Payer: Medicaid Other | Attending: Certified Nurse Midwife | Admitting: Certified Nurse Midwife

## 2023-09-26 DIAGNOSIS — O24419 Gestational diabetes mellitus in pregnancy, unspecified control: Secondary | ICD-10-CM | POA: Insufficient documentation

## 2023-09-26 DIAGNOSIS — O133 Gestational [pregnancy-induced] hypertension without significant proteinuria, third trimester: Principal | ICD-10-CM | POA: Insufficient documentation

## 2023-09-26 DIAGNOSIS — O163 Unspecified maternal hypertension, third trimester: Principal | ICD-10-CM | POA: Diagnosis present

## 2023-09-26 DIAGNOSIS — Z794 Long term (current) use of insulin: Secondary | ICD-10-CM | POA: Insufficient documentation

## 2023-09-26 DIAGNOSIS — Z3A36 36 weeks gestation of pregnancy: Secondary | ICD-10-CM | POA: Insufficient documentation

## 2023-09-26 DIAGNOSIS — O26893 Other specified pregnancy related conditions, third trimester: Secondary | ICD-10-CM | POA: Insufficient documentation

## 2023-09-26 DIAGNOSIS — R519 Headache, unspecified: Secondary | ICD-10-CM | POA: Insufficient documentation

## 2023-09-27 ENCOUNTER — Encounter: Payer: Self-pay | Admitting: Obstetrics and Gynecology

## 2023-09-27 ENCOUNTER — Other Ambulatory Visit: Payer: Self-pay

## 2023-09-27 DIAGNOSIS — Z3A36 36 weeks gestation of pregnancy: Secondary | ICD-10-CM | POA: Diagnosis not present

## 2023-09-27 DIAGNOSIS — O26893 Other specified pregnancy related conditions, third trimester: Secondary | ICD-10-CM | POA: Diagnosis not present

## 2023-09-27 DIAGNOSIS — O163 Unspecified maternal hypertension, third trimester: Principal | ICD-10-CM | POA: Diagnosis present

## 2023-09-27 DIAGNOSIS — O24419 Gestational diabetes mellitus in pregnancy, unspecified control: Secondary | ICD-10-CM | POA: Diagnosis not present

## 2023-09-27 DIAGNOSIS — Z794 Long term (current) use of insulin: Secondary | ICD-10-CM | POA: Diagnosis not present

## 2023-09-27 DIAGNOSIS — O133 Gestational [pregnancy-induced] hypertension without significant proteinuria, third trimester: Secondary | ICD-10-CM | POA: Diagnosis present

## 2023-09-27 DIAGNOSIS — R519 Headache, unspecified: Secondary | ICD-10-CM | POA: Diagnosis not present

## 2023-09-27 LAB — COMPREHENSIVE METABOLIC PANEL
ALT: 9 U/L (ref 0–44)
AST: 12 U/L — ABNORMAL LOW (ref 15–41)
Albumin: 2.8 g/dL — ABNORMAL LOW (ref 3.5–5.0)
Alkaline Phosphatase: 102 U/L (ref 38–126)
Anion gap: 7 (ref 5–15)
BUN: 9 mg/dL (ref 6–20)
CO2: 23 mmol/L (ref 22–32)
Calcium: 8.2 mg/dL — ABNORMAL LOW (ref 8.9–10.3)
Chloride: 107 mmol/L (ref 98–111)
Creatinine, Ser: 0.47 mg/dL (ref 0.44–1.00)
GFR, Estimated: >60 mL/min (ref >60)
Glucose, Bld: 106 mg/dL — ABNORMAL HIGH (ref 70–99)
Potassium: 3.9 mmol/L (ref 3.5–5.1)
Sodium: 137 mmol/L (ref 135–145)
Total Bilirubin: 0.4 mg/dL (ref 0.3–1.2)
Total Protein: 6.5 g/dL (ref 6.5–8.1)

## 2023-09-27 LAB — PROTEIN / CREATININE RATIO, URINE
Creatinine, Urine: 188 mg/dL
Protein Creatinine Ratio: 0.24 mg/mg{creat} — ABNORMAL HIGH (ref 0.00–0.15)
Total Protein, Urine: 45 mg/dL

## 2023-09-27 LAB — CBC
HCT: 33.3 % — ABNORMAL LOW (ref 36.0–46.0)
Hemoglobin: 10.5 g/dL — ABNORMAL LOW (ref 12.0–15.0)
MCH: 26.4 pg (ref 26.0–34.0)
MCHC: 31.5 g/dL (ref 30.0–36.0)
MCV: 83.9 fL (ref 80.0–100.0)
Platelets: 221 10*3/uL (ref 150–400)
RBC: 3.97 MIL/uL (ref 3.87–5.11)
RDW: 15.4 % (ref 11.5–15.5)
WBC: 12.4 10*3/uL — ABNORMAL HIGH (ref 4.0–10.5)
nRBC: 0 % (ref 0.0–0.2)

## 2023-09-27 MED ORDER — DOCUSATE SODIUM 100 MG PO CAPS: 100.0000 mg | ORAL_CAPSULE | Freq: Every day | ORAL | Status: DC

## 2023-09-27 MED ORDER — DIPHENHYDRAMINE HCL 25 MG PO CAPS
25.0000 mg | ORAL_CAPSULE | Freq: Four times a day (QID) | ORAL | Status: AC | PRN
  Administered 2023-09-27: 25 mg via ORAL
  Filled 2023-09-27: qty 1

## 2023-09-27 MED ORDER — ACETAMINOPHEN 500 MG PO TABS
1000.0000 mg | ORAL_TABLET | Freq: Four times a day (QID) | ORAL | Status: AC | PRN
  Administered 2023-09-27: 1000 mg via ORAL
  Filled 2023-09-27: qty 2

## 2023-09-27 MED ORDER — ZOLPIDEM TARTRATE 5 MG PO TABS: 5.0000 mg | ORAL_TABLET | Freq: Every evening | ORAL | Status: DC | PRN

## 2023-09-27 MED ORDER — ACETAMINOPHEN 325 MG PO TABS: 650.0000 mg | ORAL_TABLET | ORAL | Status: DC | PRN

## 2023-09-27 MED ORDER — PRENATAL MULTIVITAMIN CH: 1.0000 | ORAL_TABLET | Freq: Every day | ORAL | Status: DC

## 2023-09-27 MED ORDER — CALCIUM CARBONATE ANTACID 500 MG PO CHEW: 2.0000 | CHEWABLE_TABLET | ORAL | Status: DC | PRN

## 2023-09-27 MED ORDER — BUTALBITAL-APAP-CAFFEINE 50-325-40 MG PO TABS: 1.0000 | ORAL_TABLET | Freq: Four times a day (QID) | ORAL | 0 refills | Status: DC | PRN

## 2023-09-27 NOTE — Discharge Summary (Signed)
Gina Gomez is a 27 y.o. female. She is at [redacted]w[redacted]d gestation. No LMP recorded. Patient is pregnant. Estimated Date of Delivery: 10/22/23  Prenatal care site: [facility]   Current pregnancy complicated by:  - GDM A2 - Anemia - Obesity  Chief complaint: elevated BP at home, headache  She reports tonight she developed a headache and when she checked her BP, it was 140s/70s. She rechecked it 15-21min later and it was 120s/70s. She rechecked it gain about after that and it was 140s/70s again. She took Tylenol this afternoon with no relief of the headache. The headache is in the front of her head. She denies floaters in her vision or RUQ pain.  S: Resting comfortably. no CTX, no VB.no LOF,  Active fetal movement.  Denies: visual changes, SOB, or RUQ/epigastric pain  Maternal Medical History:   Past Medical History:  Diagnosis Date   Gestational diabetes    Headache    migraine   Medical history non-contributory    Obesity (BMI 35.0-39.9 without comorbidity)     Past Surgical History:  Procedure Laterality Date   NO PAST SURGERIES      No Known Allergies  Prior to Admission medications   Medication Sig Start Date End Date Taking? Authorizing Provider  butalbital-acetaminophen-caffeine (FIORICET) 50-325-40 MG tablet Take 1-2 tablets by mouth every 6 (six) hours as needed for headache. 09/27/23 09/26/24 Yes Natalyn Szymanowski, Marny Lowenstein, CNM  ferrous sulfate 324 MG TBEC Take 324 mg by mouth daily with breakfast.   Yes [provider]  insulin glargine (LANTUS) 100 UNIT/ML injection Inject 27 Units into the skin daily.   Yes [provider]      Social History: She  reports that she has never smoked. She has never used smokeless tobacco. She reports that she does not drink alcohol and does not use drugs.  Family History: family history includes Cancer in her maternal grandmother; Diabetes in her maternal grandfather; Heart disease in her maternal  grandfather and paternal grandmother; High Cholesterol in her paternal grandmother; Hypertension in her maternal grandfather; Ovarian cysts in her mother.  no history of gyn cancers  Review of Systems: A full review of systems was performed and negative except as noted in the HPI.     O:  BP 132/78   Pulse (!) 108   Temp 98.3 F (36.8 C) (Oral)   Resp 16   Ht 5\' 2"  (1.575 m)   Wt 117.9 kg   BMI 47.55 kg/m  Results for orders placed or performed during the hospital encounter of 09/26/23 (from the past 48 hour(s))  Protein / creatinine ratio, urine   Collection Time: 09/27/23 12:00 AM  Result Value Ref Range   Creatinine, Urine 188 mg/dL   Total Protein, Urine 45 mg/dL   Protein Creatinine Ratio 0.24 (H) 0.00 - 0.15 mg/mg[Cre]  CBC   Collection Time: 09/27/23 12:38 AM  Result Value Ref Range   WBC 12.4 (H) 4.0 - 10.5 K/uL   RBC 3.97 3.87 - 5.11 MIL/uL   Hemoglobin 10.5 (L) 12.0 - 15.0 g/dL   HCT 16.1 (L) 09.6 - 04.5 %   MCV 83.9 80.0 - 100.0 fL   MCH 26.4 26.0 - 34.0 pg   MCHC 31.5 30.0 - 36.0 g/dL   RDW 40.9 81.1 - 91.4 %   Platelets 221 150 - 400 K/uL   nRBC 0.0 0.0 - 0.2 %  Comprehensive metabolic panel   Collection Time: 09/27/23 12:38 AM  Result Value Ref  Range   Sodium 137 135 - 145 mmol/L   Potassium 3.9 3.5 - 5.1 mmol/L   Chloride 107 98 - 111 mmol/L   CO2 23 22 - 32 mmol/L   Glucose, Bld 106 (H) 70 - 99 mg/dL   BUN 9 6 - 20 mg/dL   Creatinine, Ser 8.41 0.44 - 1.00 mg/dL   Calcium 8.2 (L) 8.9 - 10.3 mg/dL   Total Protein 6.5 6.5 - 8.1 g/dL   Albumin 2.8 (L) 3.5 - 5.0 g/dL   AST 12 (L) 15 - 41 U/L   ALT 9 0 - 44 U/L   Alkaline Phosphatase 102 38 - 126 U/L   Total Bilirubin 0.4 0.3 - 1.2 mg/dL   GFR, Estimated >32 >44 mL/min   Anion gap 7 5 - 15  Results for orders placed or performed during the hospital encounter of 09/25/23 (from the past 48 hour(s))  Protein / creatinine ratio, urine   Collection Time: 09/25/23  7:56 PM  Result Value Ref Range    Creatinine, Urine 256 mg/dL   Total Protein, Urine 66 mg/dL   Protein Creatinine Ratio 0.26 (H) 0.00 - 0.15 mg/mg[Cre]  CBC with Differential/Platelet   Collection Time: 09/25/23  8:20 PM  Result Value Ref Range   WBC 10.9 (H) 4.0 - 10.5 K/uL   RBC 4.04 3.87 - 5.11 MIL/uL   Hemoglobin 10.8 (L) 12.0 - 15.0 g/dL   HCT 01.0 (L) 27.2 - 53.6 %   MCV 83.9 80.0 - 100.0 fL   MCH 26.7 26.0 - 34.0 pg   MCHC 31.9 30.0 - 36.0 g/dL   RDW 64.4 03.4 - 74.2 %   Platelets 228 150 - 400 K/uL   nRBC 0.0 0.0 - 0.2 %   Neutrophils Relative % 67 %   Neutro Abs 7.2 1.7 - 7.7 K/uL   Lymphocytes Relative 23 %   Lymphs Abs 2.5 0.7 - 4.0 K/uL   Monocytes Relative 7 %   Monocytes Absolute 0.8 0.1 - 1.0 K/uL   Eosinophils Relative 1 %   Eosinophils Absolute 0.1 0.0 - 0.5 K/uL   Basophils Relative 0 %   Basophils Absolute 0.0 0.0 - 0.1 K/uL   Immature Granulocytes 2 %   Abs Immature Granulocytes 0.23 (H) 0.00 - 0.07 K/uL  Comprehensive metabolic panel   Collection Time: 09/25/23  8:20 PM  Result Value Ref Range   Sodium 136 135 - 145 mmol/L   Potassium 4.1 3.5 - 5.1 mmol/L   Chloride 107 98 - 111 mmol/L   CO2 24 22 - 32 mmol/L   Glucose, Bld 85 70 - 99 mg/dL   BUN 8 6 - 20 mg/dL   Creatinine, Ser 5.95 0.44 - 1.00 mg/dL   Calcium 8.3 (L) 8.9 - 10.3 mg/dL   Total Protein 6.7 6.5 - 8.1 g/dL   Albumin 2.9 (L) 3.5 - 5.0 g/dL   AST 12 (L) 15 - 41 U/L   ALT 10 0 - 44 U/L   Alkaline Phosphatase 108 38 - 126 U/L   Total Bilirubin 0.4 0.3 - 1.2 mg/dL   GFR, Estimated >63 >87 mL/min   Anion gap 5 5 - 15     Constitutional: NAD, AAOx3  HE/ENT: extraocular movements grossly intact, moist mucous membranes CV: RRR PULM: nl respiratory effort, CTABL     Abd: gravid, non-tender, non-distended, soft      Ext: Non-tender, Nonedematous   Psych: mood appropriate, speech normal Pelvic: deferred  Fetal  monitoring: Cat 1 Appropriate  for GA Baseline: 135bpm Variability: moderate Accelerations: present x  >2 Decelerations absent Time 2 hours  A/P: 27 y.o. 105w3d here for antenatal surveillance for elevated BP and headache  Principle Diagnosis:  Elevated BP, headache  Pre-eclampsia: not present, labs reassuring, BP tonight is normal. She has an appointment in the office today so advised to keep her appointment as scheduled. Headache: not resolved with 1000mg  Tylenol and 25mg  Benadryl PO. Offered Compazine, Fioricet, Reglan, and more headache treatment medications and she declines, states she would like to go home and go to bed. She feels reassured about her headache as long as her labs and BP are reassuring. Rx for Fioricet sent to her pharmacy for migraines. Labor: not present.  Fetal Wellbeing: Reassuring Cat 1 tracing. Reactive NST  D/c home stable, precautions reviewed, follow-up as scheduled.    Janyce Llanos, CNM 09/27/2023 2:21 AM

## 2023-09-27 NOTE — OB Triage Note (Signed)
Pt presents to L&D with 6 out of 10 frontal headache and blurry vision starting today around noon. Pt has taken medication at home and HA is unrelieved. Took BP at home and was elevated. Endorses positive fetal movement. Denies LOF or bleeding. Initial BP 132/78. All other VSS. Monitors applied and assessing. FHT 145. Urine sample collected and sent.  Maureen Duesing B. Elba Dendinger, RN BSN 09/27/2023 12:13 AM

## 2023-09-28 ENCOUNTER — Other Ambulatory Visit: Payer: Self-pay

## 2023-09-28 ENCOUNTER — Encounter: Payer: Self-pay | Admitting: Obstetrics and Gynecology

## 2023-09-28 ENCOUNTER — Observation Stay
Admission: EM | Admit: 2023-09-28 | Discharge: 2023-09-28 | Disposition: A | Discharge status: Home / Self Care | Payer: Medicaid Other | Attending: Obstetrics and Gynecology | Admitting: Obstetrics and Gynecology

## 2023-09-28 DIAGNOSIS — Z3A36 36 weeks gestation of pregnancy: Secondary | ICD-10-CM | POA: Diagnosis not present

## 2023-09-28 DIAGNOSIS — R519 Headache, unspecified: Secondary | ICD-10-CM | POA: Diagnosis not present

## 2023-09-28 DIAGNOSIS — O133 Gestational [pregnancy-induced] hypertension without significant proteinuria, third trimester: Principal | ICD-10-CM | POA: Insufficient documentation

## 2023-09-28 DIAGNOSIS — Z87891 Personal history of nicotine dependence: Secondary | ICD-10-CM | POA: Insufficient documentation

## 2023-09-28 DIAGNOSIS — O26893 Other specified pregnancy related conditions, third trimester: Secondary | ICD-10-CM | POA: Diagnosis not present

## 2023-09-28 DIAGNOSIS — O163 Unspecified maternal hypertension, third trimester: Principal | ICD-10-CM | POA: Diagnosis present

## 2023-09-28 LAB — CBC
HCT: 33.2 % — ABNORMAL LOW (ref 36.0–46.0)
Hemoglobin: 10.8 g/dL — ABNORMAL LOW (ref 12.0–15.0)
MCH: 27 pg (ref 26.0–34.0)
MCHC: 32.5 g/dL (ref 30.0–36.0)
MCV: 83 fL (ref 80.0–100.0)
Platelets: 221 10*3/uL (ref 150–400)
RBC: 4 MIL/uL (ref 3.87–5.11)
RDW: 15.5 % (ref 11.5–15.5)
WBC: 9.4 10*3/uL (ref 4.0–10.5)
nRBC: 0 % (ref 0.0–0.2)

## 2023-09-28 LAB — COMPREHENSIVE METABOLIC PANEL
ALT: 10 U/L (ref 0–44)
AST: 14 U/L — ABNORMAL LOW (ref 15–41)
Albumin: 2.8 g/dL — ABNORMAL LOW (ref 3.5–5.0)
Alkaline Phosphatase: 90 U/L (ref 38–126)
Anion gap: 7 (ref 5–15)
BUN: 8 mg/dL (ref 6–20)
CO2: 21 mmol/L — ABNORMAL LOW (ref 22–32)
Calcium: 8.5 mg/dL — ABNORMAL LOW (ref 8.9–10.3)
Chloride: 105 mmol/L (ref 98–111)
Creatinine, Ser: 0.49 mg/dL (ref 0.44–1.00)
GFR, Estimated: >60 mL/min (ref >60)
Glucose, Bld: 95 mg/dL (ref 70–99)
Potassium: 4 mmol/L (ref 3.5–5.1)
Sodium: 133 mmol/L — ABNORMAL LOW (ref 135–145)
Total Bilirubin: 0.2 mg/dL — ABNORMAL LOW (ref 0.3–1.2)
Total Protein: 6.2 g/dL — ABNORMAL LOW (ref 6.5–8.1)

## 2023-09-28 LAB — PROTEIN / CREATININE RATIO, URINE
Creatinine, Urine: 25 mg/dL
Total Protein, Urine: <6 mg/dL

## 2023-09-28 MED ORDER — BUTALBITAL-APAP-CAFFEINE 50-325-40 MG PO TABS
2.0000 | ORAL_TABLET | Freq: Once | ORAL | Status: DC
  Filled 2023-09-28: qty 2

## 2023-09-28 NOTE — OB Triage Note (Signed)

## 2023-09-28 NOTE — Progress Notes (Signed)
RN to pt bedside to offer fioricet to relieve headache. Pt states that she will not be taking this medication because when she went to pick up a previous prescription of this medication, the pharmacist told her that she should not take it during pregnancy. Pt followed up the next day with [facility] clinic to inquire about this medication. She could not recall who she spoke with, but the person she talked to at [facility] confirmed that she should not take fioricet while pregnant. RN endorsed that this medication should be not used excessively during pregnancy, but is safe in limited use. RN will escalate pt concerns to midwife.

## 2023-09-28 NOTE — Progress Notes (Signed)
Pt presents to L/D triage with reported ongoing headache, rated 6/10, and elevated BP at home in the 160s. Pt reports no bleeding or LOF and positive fetal movement.  Pt reports no blurry vision, epigastric pain, or atypical edema. +2 reflexes, no clonus.  She is adequately PO hydrating.  Monitors applied and assessing. Initial BP 133/73-cycling 15 min.  Pt was discharged early this morning for PIH eval.

## 2023-09-28 NOTE — Discharge Summary (Signed)
Patient ID: ENGA LAUDICINA MRN: [id number] DOB/AGE: 27-Dec-1995 28 y.o.  Admit date: 09/28/2023 Discharge date: 09/28/2023  Admission Diagnoses: 27yo G3P2 at 109w4d presents with concerns of elevated BPs at home.  She also complains of a headache for about a week. She has not taken any medication for her headache and she has a history of migraine headaches.  Discharge Diagnoses: Normotensive  Factors complicating pregnancy: - GDM A2 - Anemia - Obesity  Prenatal Procedures: NST  Consults: None  Significant Diagnostic Studies:  Results for orders placed or performed during the hospital encounter of 09/28/23 (from the past 168 hour(s))  Protein / creatinine ratio, urine   Collection Time: 09/28/23  1:04 PM  Result Value Ref Range   Creatinine, Urine 25 mg/dL   Total Protein, Urine <6 mg/dL   Protein Creatinine Ratio        0.00 - 0.15 mg/mg[Cre]  Comprehensive metabolic panel   Collection Time: 09/28/23  1:40 PM  Result Value Ref Range   Sodium 133 (L) 135 - 145 mmol/L   Potassium 4.0 3.5 - 5.1 mmol/L   Chloride 105 98 - 111 mmol/L   CO2 21 (L) 22 - 32 mmol/L   Glucose, Bld 95 70 - 99 mg/dL   BUN 8 6 - 20 mg/dL   Creatinine, Ser 2.95 0.44 - 1.00 mg/dL   Calcium 8.5 (L) 8.9 - 10.3 mg/dL   Total Protein 6.2 (L) 6.5 - 8.1 g/dL   Albumin 2.8 (L) 3.5 - 5.0 g/dL   AST 14 (L) 15 - 41 U/L   ALT 10 0 - 44 U/L   Alkaline Phosphatase 90 38 - 126 U/L   Total Bilirubin 0.2 (L) 0.3 - 1.2 mg/dL   GFR, Estimated >28 >41 mL/min   Anion gap 7 5 - 15  CBC   Collection Time: 09/28/23  1:40 PM  Result Value Ref Range   WBC 9.4 4.0 - 10.5 K/uL   RBC 4.00 3.87 - 5.11 MIL/uL   Hemoglobin 10.8 (L) 12.0 - 15.0 g/dL   HCT 32.4 (L) 40.1 - 02.7 %   MCV 83.0 80.0 - 100.0 fL   MCH 27.0 26.0 - 34.0 pg   MCHC 32.5 30.0 - 36.0 g/dL   RDW 25.3 66.4 - 40.3 %   Platelets 221 150 - 400 K/uL   nRBC 0.0 0.0 - 0.2 %    Treatments: none  Hospital Course:  This is a 27 y.o. G3P1002 with IUP at [redacted]w[redacted]d  seen for elevated home BPs and continued HA.  PIH labs collected and all WNL.  Pt refuses any medications for her headache.  BPs ranged 104-137 / 58-76.  She was observed, fetal heart rate monitoring remained reassuring, and she had no signs/symptoms of gestational hypertension or Pre-e at this time. She was deemed stable for discharge to home with outpatient follow up.  Discharge Physical Exam:  BP 137/71   Pulse 91   Temp 98.6 F (37 C) (Oral)   Resp 18   Ht 5\' 2"  (1.575 m)   Wt 117.9 kg   BMI 47.55 kg/m   Vitals:   09/28/23 1259 09/28/23 1315 09/28/23 1330 09/28/23 1435  BP: 133/73 124/64 118/69 136/76   09/28/23 1450 09/28/23 1505 09/28/23 1520 09/28/23 1535  BP: (!) 104/41 133/67 (!) 118/58 126/76   09/28/23 1550  BP: 137/71       NST: FHR baseline: 135 bpm Variability: moderate Accelerations: yes Decelerations: none Time: 1256 - 1550 Category/reactivity: reactive  TOCO: quiet  SVE: deferred      Discharge Condition: Stable  Disposition: Discharge disposition: 01-Home or Self Care        Allergies as of 09/28/2023   No Known Allergies      Medication List     TAKE these medications    butalbital-acetaminophen-caffeine 50-325-40 MG tablet Commonly known as: FIORICET Take 1-2 tablets by mouth every 6 (six) hours as needed for headache.   ferrous sulfate 324 MG Tbec Take 324 mg by mouth daily with breakfast.   insulin glargine 100 UNIT/ML injection Commonly known as: LANTUS Inject 27 Units into the skin daily.        Follow-up Information     [facility]. Go on 09/30/2023.   Contact information: [address] [place] [address] [phone]                Signed:  Quillian Quince 09/28/2023 4:06 PM

## 2023-09-30 ENCOUNTER — Other Ambulatory Visit: Payer: Self-pay | Admitting: Certified Nurse Midwife

## 2023-09-30 ENCOUNTER — Observation Stay
Admission: EM | Admit: 2023-09-30 | Discharge: 2023-09-30 | Disposition: A | Discharge status: Home / Self Care | Payer: Medicaid Other | Source: Home / Self Care | Admitting: Obstetrics and Gynecology

## 2023-09-30 ENCOUNTER — Other Ambulatory Visit: Payer: Self-pay

## 2023-09-30 DIAGNOSIS — O133 Gestational [pregnancy-induced] hypertension without significant proteinuria, third trimester: Secondary | ICD-10-CM | POA: Insufficient documentation

## 2023-09-30 DIAGNOSIS — O365930 Maternal care for other known or suspected poor fetal growth, third trimester, not applicable or unspecified: Secondary | ICD-10-CM | POA: Insufficient documentation

## 2023-09-30 DIAGNOSIS — E669 Obesity, unspecified: Secondary | ICD-10-CM | POA: Insufficient documentation

## 2023-09-30 DIAGNOSIS — O24419 Gestational diabetes mellitus in pregnancy, unspecified control: Secondary | ICD-10-CM | POA: Insufficient documentation

## 2023-09-30 DIAGNOSIS — O99013 Anemia complicating pregnancy, third trimester: Secondary | ICD-10-CM | POA: Insufficient documentation

## 2023-09-30 DIAGNOSIS — Z3A36 36 weeks gestation of pregnancy: Secondary | ICD-10-CM | POA: Insufficient documentation

## 2023-09-30 DIAGNOSIS — O288 Other abnormal findings on antenatal screening of mother: Principal | ICD-10-CM | POA: Diagnosis present

## 2023-09-30 DIAGNOSIS — Z3490 Encounter for supervision of normal pregnancy, unspecified, unspecified trimester: Secondary | ICD-10-CM

## 2023-09-30 DIAGNOSIS — O10919 Unspecified pre-existing hypertension complicating pregnancy, unspecified trimester: Secondary | ICD-10-CM

## 2023-09-30 DIAGNOSIS — O99213 Obesity complicating pregnancy, third trimester: Secondary | ICD-10-CM | POA: Insufficient documentation

## 2023-09-30 DIAGNOSIS — O365990 Maternal care for other known or suspected poor fetal growth, unspecified trimester, not applicable or unspecified: Secondary | ICD-10-CM

## 2023-09-30 LAB — CBC
HCT: 36.2 % (ref 36.0–46.0)
Hemoglobin: 11.4 g/dL — ABNORMAL LOW (ref 12.0–15.0)
MCH: 26.8 pg (ref 26.0–34.0)
MCHC: 31.5 g/dL (ref 30.0–36.0)
MCV: 85.2 fL (ref 80.0–100.0)
Platelets: 231 10*3/uL (ref 150–400)
RBC: 4.25 MIL/uL (ref 3.87–5.11)
RDW: 15.6 % — ABNORMAL HIGH (ref 11.5–15.5)
WBC: 12.3 10*3/uL — ABNORMAL HIGH (ref 4.0–10.5)
nRBC: 0 % (ref 0.0–0.2)

## 2023-09-30 LAB — COMPREHENSIVE METABOLIC PANEL
ALT: 10 U/L (ref 0–44)
AST: 11 U/L — ABNORMAL LOW (ref 15–41)
Albumin: 3 g/dL — ABNORMAL LOW (ref 3.5–5.0)
Alkaline Phosphatase: 115 U/L (ref 38–126)
Anion gap: 7 (ref 5–15)
BUN: 7 mg/dL (ref 6–20)
CO2: 24 mmol/L (ref 22–32)
Calcium: 8.4 mg/dL — ABNORMAL LOW (ref 8.9–10.3)
Chloride: 104 mmol/L (ref 98–111)
Creatinine, Ser: 0.46 mg/dL (ref 0.44–1.00)
GFR, Estimated: >60 mL/min (ref >60)
Glucose, Bld: 80 mg/dL (ref 70–99)
Potassium: 4 mmol/L (ref 3.5–5.1)
Sodium: 135 mmol/L (ref 135–145)
Total Bilirubin: 0.5 mg/dL (ref 0.3–1.2)
Total Protein: 7 g/dL (ref 6.5–8.1)

## 2023-09-30 LAB — PROTEIN / CREATININE RATIO, URINE
Creatinine, Urine: 84 mg/dL
Protein Creatinine Ratio: 0.23 mg/mg{creat} — ABNORMAL HIGH (ref 0.00–0.15)
Total Protein, Urine: 19 mg/dL

## 2023-09-30 MED ORDER — ZOLPIDEM TARTRATE 5 MG PO TABS: 5.0000 mg | ORAL_TABLET | Freq: Every evening | ORAL | Status: DC | PRN

## 2023-09-30 MED ORDER — DOCUSATE SODIUM 100 MG PO CAPS: 100.0000 mg | ORAL_CAPSULE | Freq: Every day | ORAL | Status: DC

## 2023-09-30 MED ORDER — CALCIUM CARBONATE ANTACID 500 MG PO CHEW: 2.0000 | CHEWABLE_TABLET | ORAL | Status: DC | PRN

## 2023-09-30 MED ORDER — PRENATAL MULTIVITAMIN CH: 1.0000 | ORAL_TABLET | Freq: Every day | ORAL | Status: DC

## 2023-09-30 MED ORDER — ACETAMINOPHEN 325 MG PO TABS: 650.0000 mg | ORAL_TABLET | ORAL | Status: DC | PRN

## 2023-09-30 NOTE — OB Triage Note (Signed)
Presents from [facility] for monitoring for non reactive NST in office. Denies pain, leaking of fluid or bleeding. States she is a GD on insulin. States FBS this morning was 103.EFMs applied

## 2023-09-30 NOTE — OB Triage Note (Signed)
Discharge instructions, labor precautions, and follow-up care reviewed with patient. All questions answered. Patient verbalized understanding. Discharged ambulatory off unit.   

## 2023-09-30 NOTE — Discharge Summary (Signed)
Gina Gomez is a 27 y.o. female. She is at [redacted]w[redacted]d gestation. No LMP recorded. Patient is pregnant. Estimated Date of Delivery: 10/22/23  Prenatal care site: [facility]   Current pregnancy complicated by:  - GDM A2 with partial control - new diagnosis of FGR, AC <4% - cHTN, noted after review of her prenatal vital signs, multiple Bps >120/80 - Anemia - Obesity  Chief complaint: sent from the office for a nonreactive NST  She was seen today in the office for a NST, growth [place], and prenatal visit. During the growth [place], she was found to have an abdominal circumference <4% and an AFI of 9cm and NST was non-reactive. She denies any concerns at this time.  S: Resting comfortably. no CTX, no VB.no LOF,  Active fetal movement.  Denies: HA, visual changes, SOB, or RUQ/epigastric pain  Maternal Medical History:   Past Medical History:  Diagnosis Date   Gestational diabetes    Headache    migraine   Medical history non-contributory    Obesity (BMI 35.0-39.9 without comorbidity)     Past Surgical History:  Procedure Laterality Date   NO PAST SURGERIES      No Known Allergies  Prior to Admission medications   Medication Sig Start Date End Date Taking? Authorizing Provider  ferrous sulfate 324 MG TBEC Take 324 mg by mouth daily with breakfast.   Yes [provider]  insulin glargine (LANTUS) 100 UNIT/ML injection Inject 27 Units into the skin daily.   Yes [provider]  butalbital-acetaminophen-caffeine (FIORICET) 50-325-40 MG tablet Take 1-2 tablets by mouth every 6 (six) hours as needed for headache. Patient not taking: Reported on 09/28/2023 09/27/23 09/26/24  Janyce Llanos, CNM    Social History: She  reports that she has never smoked. She has never used smokeless tobacco. She reports that she does not drink alcohol and does not use drugs.  Family History: family history includes Cancer in her maternal grandmother; Diabetes in her maternal  grandfather; Heart disease in her maternal grandfather and paternal grandmother; High Cholesterol in her paternal grandmother; Hypertension in her maternal grandfather; Ovarian cysts in her mother.  no history of gyn cancers  Review of Systems: A full review of systems was performed and negative except as noted in the HPI.     O:  BP 134/60   Pulse (!) 101   Temp 98.5 F (36.9 C) (Oral)   Resp 16   Ht 5\' 2"  (1.575 m)   Wt 119.3 kg   BMI 48.10 kg/m  Vitals:   09/30/23 1413 09/30/23 1428 09/30/23 1443 09/30/23 1459  BP: 110/66 125/79 127/72 111/61   09/30/23 1513 09/30/23 1528 09/30/23 1543 09/30/23 1558  BP: 111/60 (!) 104/47 118/69 128/77   09/30/23 1613 09/30/23 1629 09/30/23 1649 09/30/23 1658  BP: 125/68 133/63 131/75 134/60    Results for orders placed or performed during the hospital encounter of 09/30/23 (from the past 48 hour(s))  Comprehensive metabolic panel   Collection Time: 09/30/23  5:23 PM  Result Value Ref Range   Sodium 135 135 - 145 mmol/L   Potassium 4.0 3.5 - 5.1 mmol/L   Chloride 104 98 - 111 mmol/L   CO2 24 22 - 32 mmol/L   Glucose, Bld 80 70 - 99 mg/dL   BUN 7 6 - 20 mg/dL   Creatinine, Ser 1.61 0.44 - 1.00 mg/dL   Calcium 8.4 (L) 8.9 - 10.3 mg/dL   Total Protein 7.0 6.5 - 8.1 g/dL  Albumin 3.0 (L) 3.5 - 5.0 g/dL   AST 11 (L) 15 - 41 U/L   ALT 10 0 - 44 U/L   Alkaline Phosphatase 115 38 - 126 U/L   Total Bilirubin 0.5 0.3 - 1.2 mg/dL   GFR, Estimated >13 >24 mL/min   Anion gap 7 5 - 15  CBC   Collection Time: 09/30/23  5:23 PM  Result Value Ref Range   WBC 12.3 (H) 4.0 - 10.5 K/uL   RBC 4.25 3.87 - 5.11 MIL/uL   Hemoglobin 11.4 (L) 12.0 - 15.0 g/dL   HCT 40.1 02.7 - 25.3 %   MCV 85.2 80.0 - 100.0 fL   MCH 26.8 26.0 - 34.0 pg   MCHC 31.5 30.0 - 36.0 g/dL   RDW 66.4 (H) 40.3 - 47.4 %   Platelets 231 150 - 400 K/uL   nRBC 0.0 0.0 - 0.2 %     Constitutional: NAD, AAOx3  HE/ENT: extraocular movements grossly intact, moist mucous  membranes CV: RRR PULM: nl respiratory effort, CTABL     Abd: gravid, non-tender, non-distended, soft      Ext: Non-tender, Nonedematous   Psych: mood appropriate, speech normal Pelvic: deferred  Fetal  monitoring: Cat 1 Appropriate for GA Baseline: 135bpm Variability: moderate Accelerations: present x >2 Decelerations absent Time 4hrs  A/P: 27 y.o. [redacted]w[redacted]d here for antenatal surveillance for non-reactive NST in the office  Principle Diagnosis:  Fetal growth restriction, GDM A2, reactive NST, chronic HTN  Discussed with Dr. Algis Downs. Schermerhorn and the patient that with multiple factors of FGR, GDM A2, and cHTN that she should be induced at 37wks. The patient is agreeable with this plan of care and will return at midnight when she is [redacted]w[redacted]d for an induction.  Fetal Wellbeing: Reassuring Cat 1 tracing. Accelerations present. Prolonged monitoring x 4hrs, reassuring. Elevated BP: she had several mild range blood pressures, pre-eclampsia labs WNL Reactive NST  D/c home stable, precautions reviewed, follow-up as scheduled.    Janyce Llanos, CNM 09/30/2023 6:08 PM

## 2023-10-01 ENCOUNTER — Inpatient Hospital Stay: Payer: Medicaid Other | Admitting: Anesthesiology

## 2023-10-01 ENCOUNTER — Encounter: Payer: Self-pay | Admitting: Obstetrics and Gynecology

## 2023-10-01 ENCOUNTER — Inpatient Hospital Stay
Admission: EM | Admit: 2023-10-01 | Discharge: 2023-10-02 | DRG: 806 | Disposition: A | Discharge status: Home / Self Care | Payer: Medicaid Other | Attending: Certified Nurse Midwife | Admitting: Obstetrics and Gynecology

## 2023-10-01 DIAGNOSIS — Z833 Family history of diabetes mellitus: Secondary | ICD-10-CM

## 2023-10-01 DIAGNOSIS — O365930 Maternal care for other known or suspected poor fetal growth, third trimester, not applicable or unspecified: Secondary | ICD-10-CM | POA: Diagnosis present

## 2023-10-01 DIAGNOSIS — O365990 Maternal care for other known or suspected poor fetal growth, unspecified trimester, not applicable or unspecified: Secondary | ICD-10-CM | POA: Diagnosis present

## 2023-10-01 DIAGNOSIS — O1092 Unspecified pre-existing hypertension complicating childbirth: Secondary | ICD-10-CM | POA: Diagnosis present

## 2023-10-01 DIAGNOSIS — Z809 Family history of malignant neoplasm, unspecified: Secondary | ICD-10-CM

## 2023-10-01 DIAGNOSIS — O24424 Gestational diabetes mellitus in childbirth, insulin controlled: Principal | ICD-10-CM | POA: Diagnosis present

## 2023-10-01 DIAGNOSIS — O99892 Other specified diseases and conditions complicating childbirth: Secondary | ICD-10-CM

## 2023-10-01 DIAGNOSIS — Z8249 Family history of ischemic heart disease and other diseases of the circulatory system: Secondary | ICD-10-CM | POA: Diagnosis not present

## 2023-10-01 DIAGNOSIS — O99214 Obesity complicating childbirth: Secondary | ICD-10-CM | POA: Diagnosis present

## 2023-10-01 DIAGNOSIS — Z8342 Family history of familial hypercholesterolemia: Secondary | ICD-10-CM

## 2023-10-01 DIAGNOSIS — O24414 Gestational diabetes mellitus in pregnancy, insulin controlled: Secondary | ICD-10-CM | POA: Diagnosis present

## 2023-10-01 DIAGNOSIS — O99210 Obesity complicating pregnancy, unspecified trimester: Secondary | ICD-10-CM | POA: Diagnosis present

## 2023-10-01 DIAGNOSIS — Z3A37 37 weeks gestation of pregnancy: Secondary | ICD-10-CM

## 2023-10-01 DIAGNOSIS — Z8742 Personal history of other diseases of the female genital tract: Secondary | ICD-10-CM

## 2023-10-01 DIAGNOSIS — Z3490 Encounter for supervision of normal pregnancy, unspecified, unspecified trimester: Secondary | ICD-10-CM | POA: Diagnosis present

## 2023-10-01 DIAGNOSIS — Z2839 Other underimmunization status: Secondary | ICD-10-CM

## 2023-10-01 DIAGNOSIS — O10919 Unspecified pre-existing hypertension complicating pregnancy, unspecified trimester: Secondary | ICD-10-CM | POA: Diagnosis present

## 2023-10-01 LAB — GLUCOSE, CAPILLARY
Glucose-Capillary: 112 mg/dL — ABNORMAL HIGH (ref 70–99)
Glucose-Capillary: 114 mg/dL — ABNORMAL HIGH (ref 70–99)
Glucose-Capillary: 85 mg/dL (ref 70–99)

## 2023-10-01 LAB — CBC
HCT: 34.7 % — ABNORMAL LOW (ref 36.0–46.0)
Hemoglobin: 11.2 g/dL — ABNORMAL LOW (ref 12.0–15.0)
MCH: 26.9 pg (ref 26.0–34.0)
MCHC: 32.3 g/dL (ref 30.0–36.0)
MCV: 83.4 fL (ref 80.0–100.0)
Platelets: 238 10*3/uL (ref 150–400)
RBC: 4.16 MIL/uL (ref 3.87–5.11)
RDW: 15.2 % (ref 11.5–15.5)
WBC: 12.2 10*3/uL — ABNORMAL HIGH (ref 4.0–10.5)
nRBC: 0 % (ref 0.0–0.2)

## 2023-10-01 LAB — GROUP B STREP BY PCR: Group B strep by PCR: NEGATIVE

## 2023-10-01 LAB — RPR: RPR Ser Ql: NONREACTIVE

## 2023-10-01 LAB — TYPE AND SCREEN
ABO/RH(D): O POS
Antibody Screen: NEGATIVE

## 2023-10-01 MED ORDER — DIBUCAINE (PERIANAL) 1 % EX OINT: 1.0000 | TOPICAL_OINTMENT | CUTANEOUS | Status: DC | PRN

## 2023-10-01 MED ORDER — LIDOCAINE HCL (PF) 1 % IJ SOLN: 30.0000 mL | INTRAMUSCULAR | Status: DC | PRN

## 2023-10-01 MED ORDER — OXYTOCIN BOLUS FROM INFUSION
333.0000 mL | Freq: Once | INTRAVENOUS | Status: AC
  Administered 2023-10-01: 333 mL via INTRAVENOUS

## 2023-10-01 MED ORDER — WITCH HAZEL-GLYCERIN EX PADS: 1.0000 | MEDICATED_PAD | CUTANEOUS | Status: DC | PRN

## 2023-10-01 MED ORDER — IBUPROFEN 600 MG PO TABS
600.0000 mg | ORAL_TABLET | Freq: Four times a day (QID) | ORAL | Status: DC
  Administered 2023-10-01 – 2023-10-02 (×4): 600 mg via ORAL
  Filled 2023-10-01 (×5): qty 1

## 2023-10-01 MED ORDER — ZOLPIDEM TARTRATE 5 MG PO TABS: 5.0000 mg | ORAL_TABLET | Freq: Every evening | ORAL | Status: DC | PRN

## 2023-10-01 MED ORDER — COCONUT OIL OIL: 1.0000 | TOPICAL_OIL | Status: DC | PRN

## 2023-10-01 MED ORDER — ACETAMINOPHEN 325 MG PO TABS: 650.0000 mg | ORAL_TABLET | ORAL | Status: DC | PRN

## 2023-10-01 MED ORDER — LIDOCAINE HCL (PF) 1 % IJ SOLN
INTRAMUSCULAR | Status: AC
  Filled 2023-10-01: qty 30

## 2023-10-01 MED ORDER — LACTATED RINGERS IV SOLN: 500.0000 mL | INTRAVENOUS | Status: DC | PRN

## 2023-10-01 MED ORDER — OXYTOCIN 10 UNIT/ML IJ SOLN
INTRAMUSCULAR | Status: AC
  Filled 2023-10-01: qty 2

## 2023-10-01 MED ORDER — SOD CITRATE-CITRIC ACID 500-334 MG/5ML PO SOLN: 30.0000 mL | ORAL | Status: DC | PRN

## 2023-10-01 MED ORDER — MEASLES, MUMPS & RUBELLA VAC IJ SOLR
0.5000 mL | Freq: Once | INTRAMUSCULAR | Status: DC
  Filled 2023-10-01: qty 0.5

## 2023-10-01 MED ORDER — FENTANYL-BUPIVACAINE-NACL 0.5-0.125-0.9 MG/250ML-% EP SOLN
EPIDURAL | Status: AC
  Filled 2023-10-01: qty 250

## 2023-10-01 MED ORDER — MISOPROSTOL 200 MCG PO TABS
ORAL_TABLET | ORAL | Status: AC
  Filled 2023-10-01: qty 4

## 2023-10-01 MED ORDER — ONDANSETRON HCL 4 MG/2ML IJ SOLN: 4.0000 mg | Freq: Four times a day (QID) | INTRAMUSCULAR | Status: DC | PRN

## 2023-10-01 MED ORDER — FENTANYL CITRATE (PF) 100 MCG/2ML IJ SOLN
50.0000 ug | INTRAMUSCULAR | Status: DC | PRN
  Filled 2023-10-01: qty 2

## 2023-10-01 MED ORDER — ONDANSETRON HCL 4 MG/2ML IJ SOLN: 4.0000 mg | INTRAMUSCULAR | Status: DC | PRN

## 2023-10-01 MED ORDER — DIPHENHYDRAMINE HCL 25 MG PO CAPS: 25.0000 mg | ORAL_CAPSULE | Freq: Four times a day (QID) | ORAL | Status: DC | PRN

## 2023-10-01 MED ORDER — LACTATED RINGERS IV SOLN: INTRAVENOUS | Status: DC

## 2023-10-01 MED ORDER — ONDANSETRON HCL 4 MG PO TABS: 4.0000 mg | ORAL_TABLET | ORAL | Status: DC | PRN

## 2023-10-01 MED ORDER — TERBUTALINE SULFATE 1 MG/ML IJ SOLN: 0.2500 mg | Freq: Once | INTRAMUSCULAR | Status: DC | PRN

## 2023-10-01 MED ORDER — OXYTOCIN-SODIUM CHLORIDE 30-0.9 UT/500ML-% IV SOLN
2.5000 [IU]/h | INTRAVENOUS | Status: DC
  Filled 2023-10-01: qty 500

## 2023-10-01 MED ORDER — BENZOCAINE-MENTHOL 20-0.5 % EX AERO: 1.0000 | INHALATION_SPRAY | CUTANEOUS | Status: DC | PRN

## 2023-10-01 MED ORDER — AMMONIA AROMATIC IN INHA
RESPIRATORY_TRACT | Status: AC
  Filled 2023-10-01: qty 10

## 2023-10-01 MED ORDER — PRENATAL MULTIVITAMIN CH
1.0000 | ORAL_TABLET | Freq: Every day | ORAL | Status: DC
  Administered 2023-10-01 – 2023-10-02 (×2): 1 via ORAL
  Filled 2023-10-01 (×2): qty 1

## 2023-10-01 MED ORDER — SIMETHICONE 80 MG PO CHEW: 80.0000 mg | CHEWABLE_TABLET | ORAL | Status: DC | PRN

## 2023-10-01 MED ORDER — SENNOSIDES-DOCUSATE SODIUM 8.6-50 MG PO TABS
2.0000 | ORAL_TABLET | Freq: Every day | ORAL | Status: DC
  Administered 2023-10-02: 2 via ORAL
  Filled 2023-10-01: qty 2

## 2023-10-01 MED ORDER — OXYTOCIN-SODIUM CHLORIDE 30-0.9 UT/500ML-% IV SOLN
1.0000 m[IU]/min | INTRAVENOUS | Status: DC
  Administered 2023-10-01: 2 m[IU]/min via INTRAVENOUS

## 2023-10-01 NOTE — Anesthesia Preprocedure Evaluation (Signed)
Anesthesia Evaluation  Patient identified by MRN, date of birth, ID band Patient awake    Reviewed: Allergy & Precautions, H&P , NPO status , Patient's Chart, lab work & pertinent test results  Airway Mallampati: III  TM Distance: >3 FB Neck ROM: Full    Dental no notable dental hx.    Pulmonary neg pulmonary ROS   Pulmonary exam normal breath sounds clear to auscultation       Cardiovascular hypertension, negative cardio ROS Normal cardiovascular exam Rhythm:Regular Rate:Normal     Neuro/Psych  Headaches negative neurological ROS  negative psych ROS   GI/Hepatic negative GI ROS, Neg liver ROS,,,  Endo/Other  negative endocrine ROSdiabetes    Renal/GU negative Renal ROS  negative genitourinary   Musculoskeletal negative musculoskeletal ROS (+)    Abdominal   Peds negative pediatric ROS (+)  Hematology negative hematology ROS (+)   Anesthesia Other Findings Called for labor epidural. Patient has had epidural twice previously.    Reproductive/Obstetrics negative OB ROS                             Anesthesia Physical Anesthesia Plan  ASA: 2  Anesthesia Plan: Epidural   Post-op Pain Management:    Induction:   PONV Risk Score and Plan:   Airway Management Planned:   Additional Equipment:   Intra-op Plan:   Post-operative Plan:   Informed Consent: I have reviewed the patients History and Physical, chart, labs and discussed the procedure including the risks, benefits and alternatives for the proposed anesthesia with the patient or authorized representative who has indicated his/her understanding and acceptance.       Plan Discussed with:   Anesthesia Plan Comments:        Anesthesia Quick Evaluation

## 2023-10-01 NOTE — H&P (Addendum)
OB History & Physical   History of Present Illness:  Chief Complaint:   HPI:  Gina Gomez is a 27 y.o. G56P1002 female at [redacted]w[redacted]d dated by LMP.  She presents to L&D for IOL for GDM A2, FGR, and cHTN.   Pregnancy Issues: - GDM A2 with partial control - new diagnosis of FGR, AC <4% - cHTN, noted after review of her prenatal vital signs, multiple Bps >120/80 - Anemia - Obesity - Rubella non-immune  Maternal Medical History:   Past Medical History:  Diagnosis Date   Gestational diabetes    Headache    migraine   Medical history non-contributory    Obesity (BMI 35.0-39.9 without comorbidity)     Past Surgical History:  Procedure Laterality Date   NO PAST SURGERIES      No Known Allergies  Prior to Admission medications   Medication Sig Start Date End Date Taking? Authorizing Provider  insulin glargine (LANTUS) 100 UNIT/ML injection Inject 27 Units into the skin daily.   Yes [provider]  butalbital-acetaminophen-caffeine (FIORICET) 50-325-40 MG tablet Take 1-2 tablets by mouth every 6 (six) hours as needed for headache. Patient not taking: Reported on 09/28/2023 09/27/23 09/26/24  Janyce Llanos, CNM  ferrous sulfate 324 MG TBEC Take 324 mg by mouth daily with breakfast.    [provider]    Prenatal care site: [facility]   Social History: She  reports that she has never smoked. She has never used smokeless tobacco. She reports that she does not drink alcohol and does not use drugs.  Family History: family history includes Cancer in her maternal grandmother; Diabetes in her maternal grandfather; Heart disease in her maternal grandfather and paternal grandmother; High Cholesterol in her paternal grandmother; Hypertension in her maternal grandfather; Ovarian cysts in her mother.   Review of Systems: A full review of systems was performed and negative except as noted in the HPI.     Physical Exam:  Vital Signs: BP 137/72   Pulse  (!) 105   Temp 98.9 F (37.2 C) (Oral)  General: no acute distress.  HEENT: normocephalic, atraumatic Heart: regular rate & rhythm.  No murmurs/rubs/gallops Lungs: clear to auscultation bilaterally, normal respiratory effort Abdomen: soft, gravid, non-tender;  EFW: 6lb Pelvic:   External: Normal external female genitalia  Cervix: 5/70/-1, midline, soft   Extremities: non-tender, symmetric, mild edema bilaterally.  DTRs: +2  Neurologic: Alert & oriented x 3.    Results for orders placed or performed during the hospital encounter of 10/01/23 (from the past 24 hour(s))  Glucose, capillary     Status: Abnormal   Collection Time: 10/01/23 12:27 AM  Result Value Ref Range   Glucose-Capillary 114 (H) 70 - 99 mg/dL  Type and screen     Status: None   Collection Time: 10/01/23 12:34 AM  Result Value Ref Range   ABO/RH(D) O POS    Antibody Screen NEG    Sample Expiration      10/04/2023,2359 Performed at [facility], [address] [place], [place] [address]   CBC     Status: Abnormal   Collection Time: 10/01/23 12:35 AM  Result Value Ref Range   WBC 12.2 (H) 4.0 - 10.5 K/uL   RBC 4.16 3.87 - 5.11 MIL/uL   Hemoglobin 11.2 (L) 12.0 - 15.0 g/dL   HCT 40.9 (L) 81.1 - 91.4 %   MCV 83.4 80.0 - 100.0 fL   MCH 26.9 26.0 - 34.0 pg   MCHC  32.3 30.0 - 36.0 g/dL   RDW 16.1 09.6 - 04.5 %   Platelets 238 150 - 400 K/uL   nRBC 0.0 0.0 - 0.2 %    Pertinent Results:  Prenatal Labs: Blood type/Rh O pos  Antibody screen neg  Rubella Non-Immune  Varicella Immune  RPR NR  HBsAg Neg  HIV NR  GC neg  Chlamydia neg  Genetic screening negative  1 hour GTT 194  3 hour GTT 101, 182, 144, 87   GBS pending   FHT: 150bpm, moderate variability, accelerations present, no decelerations, Category I tracing TOCO: rare SVE:  5/70/-1   Cephalic by leopolds  US FETAL BPP WO NON STRESS  Result Date: 09/16/2023 CLINICAL DATA:  Nonreactive stress test EXAM: BIOPHYSICAL PROFILE  FINDINGS: Number of Fetuses: 1 Heart Rate: 141 bpm Presentation: Cephalic Movement: 2 time: 30 minutes Breathing: 2 Tone: 2 Amniotic Fluid: 2 Total Score: 8/8 IMPRESSION: Normal biophysical profile, 8/8. Electronically Signed   By: Layla Maw M.D.   On: 09/16/2023 17:52    Assessment:  Gina Gomez is a 27 y.o. G63P1002 female at [redacted]w[redacted]d with IOL for GDM A2, cHTN, and FGR.   Plan:  1. Admit to Labor & Delivery; consents reviewed and obtained - Dr. Algis Downs Schermerhorn notified of admission  2. Fetal Well being  - Fetal Tracing: Category I tracing - Group B Streptococcus ppx indicated: pending results - Presentation: vertex confirmed by SVE   3. Routine OB: - Prenatal labs reviewed, as above - Rh positive - CBC, T&S, RPR on admit - Clear fluids, IVF  4. Induction of Labor -  Contractions occasional, external toco in place -  Pelvis proven to 2880g, adequate for trial of labor -  Plan for induction with pitocin and AROM -  Plan for continuous fetal monitoring  -  Maternal pain control as desired; requesting regional anesthesia - Anticipate vaginal delivery  5. Post Partum Planning: - Infant feeding: breastfeeding - Contraception: BTL, BTL consent signed 07/29/23 - RSV: received 09/16/23 - Tdap: received 07/29/23 - Flu: declined  6. Chronic HTN: - diagnosed after review of prenatal labs with multiple diastolic Bps in the 80s - PIH labs checked 6hrs prior to induction and WNL - BP 137/72  7. GDM A2: - will check blood sugars q4hrs in early labor and q2hrs active labor - consider Endotool if >2 blood sugars >120 - nursery aware of GDM A2 maternal status  8. FGR: - today's growth US showed an abdominal circumference <4% - Category I tracing - induction with pitocin  Janyce Llanos, CNM 10/01/23 1:50 AM

## 2023-10-01 NOTE — Progress Notes (Addendum)
Postpartum Day  0  Subjective: 27 y.o. G3P2003 postpartum day #0 status post normal spontaneous vaginal delivery. She is ambulating, is tolerating po, is voiding spontaneously.  Her pain is well controlled on PO pain medications. Lochia is appropriate at this time.   Objective: BP 121/68 (BP Location: Left Arm)   Pulse 99   Temp 98.5 F (36.9 C) (Oral)   Resp 18   SpO2 100%   Breastfeeding Unknown    Physical Exam:  General: alert, cooperative, appears stated age, and no distress Breasts: soft/nontender Pulm: nl effort Abdomen: soft, non-tender, active bowel sounds Uterine Fundus: firm Perineum: minimal edema, laceration hemostatic Lochia: appropriate DVT Evaluation: No evidence of DVT seen on physical exam.  Recent Labs    09/30/23 1723 10/01/23 0035  HGB 11.4* 11.2*  HCT 36.2 34.7*  WBC 12.3* 12.2*  PLT 231 238    Assessment/Plan: 27 y.o. G3P2003 postpartum day # 0  1. Continue routine postpartum care  2. Infant feeding status: breast and formula feeding -Lactation consult PRN for breastfeeding   3. Contraception plan: bilateral tubal ligation  4. Acute blood loss anemia - clinically not significant .  -Hemodynamically stable and asymptomatic -Intervention: start on oral supplementation with ferrous sulfate 325 mg  and no intervention   5. Immunization status:   needs MMR prior to discharge   Disposition: continue inpatient postpartum care    LOS: 0 days   Lora Chavers, CNM 10/01/2023, 12:50 PM   ----- Roney Jaffe Certified Nurse Midwife [facility]

## 2023-10-01 NOTE — Anesthesia Postprocedure Evaluation (Signed)
Anesthesia Post Note  Patient: Gina Gomez  Procedure(s) Performed: AN AD HOC LABOR EPIDURAL  Anesthetic complications: no   No notable events documented.   Last Vitals:  Vitals:   10/01/23 0605 10/01/23 0610  BP: (!) 108/55   Pulse: (!) 123   Temp:    SpO2: 100% 100%    Last Pain:  Vitals:   10/01/23 0035  TempSrc: Oral                 Suzzanne Brunkhorst C Larose Batres

## 2023-10-01 NOTE — Discharge Summary (Signed)
Postpartum Discharge Summary  Patient Name: Gina Gomez DOB: 1996/10/17 MRN: [id number]  Date of admission: 10/01/2023 Delivery date:10/01/2023 Delivering provider: Donato Schultz RENEE Date of discharge: 10/02/2023  Primary OB: [facility] LMP:No LMP recorded. EDC Estimated Date of Delivery: 10/22/23 Gestational Age at Delivery: [redacted]w[redacted]d   Admitting diagnosis: Encounter for induction of labor [Z34.90] Intrauterine pregnancy: [redacted]w[redacted]d     Secondary diagnosis:   Principal Problem:   NSVD (normal spontaneous vaginal delivery) Active Problems:   Encounter for induction of labor   Chronic hypertension affecting pregnancy   Fetal growth restriction antepartum   Obesity affecting pregnancy   Rubella nonimmune status, delivered, current hospitalization   Precipitous delivery   Insulin controlled gestational diabetes mellitus (GDM) in third trimester   Discharge Diagnosis: Term Pregnancy Delivered                                                Post partum procedures: None Induction:: AROM and Pitocin Complications: None Delivery Type: spontaneous vaginal delivery Anesthesia: attempted epidural anesthesia Placenta: spontaneous To Pathology: No  Laceration: 1st degree, hemostatic and approximated, no need for repair Episiotomy: none  Prenatal Labs:  Blood type/Rh O pos  Antibody screen neg  Rubella Non-Immune  Varicella Immune  RPR NR  HBsAg Neg  HIV NR  GC neg  Chlamydia neg  Genetic screening negative  1 hour GTT 194  3 hour GTT 101, 182, 144, 87   GBS Negative by PCR    Hospital course: Induction of Labor With Vaginal Delivery   27 y.o. yo G3P3003 at [redacted]w[redacted]d was admitted to the hospital 10/01/2023 for induction of labor.  Indication for induction:  GDM A2, FGR, cHTN .  Patient had an labor course complicated by precipitous delivery. Infant delivered prior to CNM being called to the room for delivery. Placenta came spontaneously with trailing membranes requiring  ring forceps to twist the membranes and allow them to release easily and intact. Membrane Rupture Time/Date: 3:20 AM,10/01/2023  Delivery Method:Vaginal, Spontaneous Operative Delivery:N/A Episiotomy: None Lacerations:  None Details of delivery can be found in separate delivery note.  Patient had a postpartum course complicated by none. Patient is discharged home 10/02/23.  Newborn Data: Birth date:10/01/2023 Birth time:6:02 AM Gender:Female Living status:Living Apgars:8 ,9  Weight:2530 g  Magnesium Sulfate received: No BMZ received: No Rhophylac:No MMR: Offered prior to discharge  Varivax vaccine given: was not indicated T-DaP:Given prenatally Flu: Yes  Transfusion:No  Physical exam  Vitals:   10/01/23 1619 10/01/23 1929 10/01/23 2342 10/02/23 0812  BP: 117/65 122/71 (!) 105/52 109/64  Pulse: 87 99 97 89  Resp: 19 20 20 20   Temp: 98.4 F (36.9 C) 98.1 F (36.7 C) 97.6 F (36.4 C) 98.2 F (36.8 C)  TempSrc: Oral Oral Oral Oral  SpO2: 100% 100% 100% 100%   General: alert, cooperative, and no distress Lochia: appropriate Uterine Fundus: firm Perineum: minimal edema/intact DVT Evaluation: No evidence of DVT seen on physical exam.  Labs: Lab Results  Component Value Date   WBC 10.2 10/02/2023   HGB 9.9 (L) 10/02/2023   HCT 31.2 (L) 10/02/2023   MCV 84.6 10/02/2023   PLT 187 10/02/2023      Latest Ref Rng & Units 09/30/2023    5:23 PM  CMP  Glucose 70 - 99 mg/dL 80   BUN 6 - 20 mg/dL 7  Creatinine 0.44 - 1.00 mg/dL 1.30   Sodium 865 - 784 mmol/L 135   Potassium 3.5 - 5.1 mmol/L 4.0   Chloride 98 - 111 mmol/L 104   CO2 22 - 32 mmol/L 24   Calcium 8.9 - 10.3 mg/dL 8.4   Total Protein 6.5 - 8.1 g/dL 7.0   Total Bilirubin 0.3 - 1.2 mg/dL 0.5   Alkaline Phos 38 - 126 U/L 115   AST 15 - 41 U/L 11   ALT 0 - 44 U/L 10    Edinburgh Score:    10/01/2023   11:25 PM  Edinburgh Postnatal Depression Scale Screening Tool  I have been able to laugh and see the  funny side of things. 0  I have looked forward with enjoyment to things. 0  I have blamed myself unnecessarily when things went wrong. 0  I have been anxious or worried for no good reason. 0  I have felt scared or panicky for no good reason. 0  Things have been getting on top of me. 0  I have been so unhappy that I have had difficulty sleeping. 0  I have felt sad or miserable. 0  I have been so unhappy that I have been crying. 0  The thought of harming myself has occurred to me. 0  Edinburgh Postnatal Depression Scale Total 0    Risk assessment for postpartum VTE and prophylactic treatment: Very high risk factors: None High risk factors: BMI 40-50 kg/m2 Moderate risk factors: None  Postpartum VTE prophylaxis with LMWH not indicated  After visit meds:  Allergies as of 10/02/2023   No Known Allergies      Medication List     STOP taking these medications    butalbital-acetaminophen-caffeine 50-325-40 MG tablet Commonly known as: FIORICET   insulin glargine 100 UNIT/ML injection Commonly known as: LANTUS       TAKE these medications    acetaminophen 325 MG tablet Commonly known as: Tylenol Take 2 tablets (650 mg total) by mouth every 4 (four) hours as needed (for pain scale < 4).   ferrous sulfate 324 MG Tbec Take 324 mg by mouth daily with breakfast.   ibuprofen 600 MG tablet Commonly known as: ADVIL Take 1 tablet (600 mg total) by mouth every 6 (six) hours as needed for mild pain (pain score 1-3), moderate pain (pain score 4-6) or cramping.   prenatal multivitamin Tabs tablet Take 1 tablet by mouth daily at 12 noon.       Discharge home in stable condition Infant Feeding: Breast and formula Infant Disposition:home with mother Discharge instruction: per After Visit Summary and Postpartum booklet. Activity: Advance as tolerated. Pelvic rest for 6 weeks.  Diet: routine diet Anticipated Birth Control: Plans Interval BTL Postpartum Appointment:6  weeks Additional Postpartum F/U: BP check 1 week and BTL consult in 2 weeks  Future Appointments:No future appointments. Follow up Visit:  Follow-up Information     [facility] Follow up in 1 week(s).   Why: blood pressure check Contact information: [address] [facility] [place] [address] [phone]        Janyce Llanos, CNM. Schedule an appointment as soon as possible for a visit in 1 week(s).   Specialty: Certified Nurse Midwife Why: blood pressure check Contact information: [address] [place] [address] [phone]         Janyce Llanos, CNM. Schedule an appointment as soon as possible for a visit in 6 week(s).   Specialty: Certified  Nurse Midwife Why: postpartum visit Contact information: [address] [place] [address] [phone]                 Plan:  Luisana Lutzke Amore was discharged to home in good condition. Follow-up appointment as directed.    Signed:  Margaretmary Eddy, CNM Certified Nurse Midwife [facility]    Gustavo Lah, [place] 10/02/2023 10:49 AM

## 2023-10-01 NOTE — Anesthesia Procedure Notes (Signed)
Epidural Patient location during procedure: OB Start time: 10/01/2023 5:38 AM End time: 10/01/2023 5:59 AM  Staffing Anesthesiologist: Marisue Humble, MD Performed: anesthesiologist   Preanesthetic Checklist Completed: patient identified, IV checked, risks and benefits discussed, surgical consent, monitors and equipment checked and timeout performed  Epidural Patient position: sitting Prep: ChloraPrep Patient monitoring: continuous pulse ox, blood pressure and heart rate Approach: midline Location: L3-L4 Injection technique: LOR saline  Needle:  Needle type: Tuohy  Needle gauge: 17 G Needle length: 9 cm Needle insertion depth: 7 cm Catheter type: closed end flexible  Assessment Events: no cerebrospinal fluid, injection not painful, no injection resistance, no paresthesia and negative IV test  Additional Notes Had just achieved loss of resistance, but Procedure ended due to patient stated she had to push, infant born at 48am.  No meds given other than skin local for initial needle placement. Reason for block:at surgeon's request and procedure for pain

## 2023-10-01 NOTE — Progress Notes (Signed)
Labor Progress Note  Gina Gomez is a 27 y.o. G3P1002 at [redacted]w[redacted]d by LMP admitted for induction of labor due to GDM A2, FGR, and cHTN.  Subjective: she reports she can feel the contractions, denies any concerns  Objective: BP 137/72   Pulse (!) 105   Temp 98.9 F (37.2 C) (Oral)  Notable VS details: last BP 123/81  Fetal Assessment: FHT:  FHR: 135 bpm, variability: moderate,  accelerations:  Present,  decelerations:  Absent Category/reactivity:  Category I UC:   regular, every 2-4 minutes SVE:    Dilation: 6cm  Effacement: 70%  Station:  -1  Consistency: medium  Position: middle  Membrane status:AROM @ 0320 Amniotic color: clear  Labs: Lab Results  Component Value Date   WBC 12.2 (H) 10/01/2023   HGB 11.2 (L) 10/01/2023   HCT 34.7 (L) 10/01/2023   MCV 83.4 10/01/2023   PLT 238 10/01/2023    Assessment / Plan: Induction of labor due to GDM A2, FGR, and cHTN,  progressing well on pitocin  Labor:  Pitocin on 17mu/min, contracting q2-2min, palpates mild to moderate, AROM with clear fluid Preeclampsia:  labs stable and BP 123/81 Fetal Wellbeing:  Category I Pain Control:   will request epidural when she wants one I/D:   GBS negative by PCR, afebrile Anticipated MOD:  NSVD  Janyce Llanos, CNM 10/01/2023, 3:21 AM

## 2023-10-01 NOTE — Anesthesia Postprocedure Evaluation (Signed)
Anesthesia Post Note  Patient: Gina Gomez  Procedure(s) Performed: AN AD HOC LABOR EPIDURAL  Patient location during evaluation: L&D Anesthesia Type: Epidural Level of consciousness: awake and alert Pain management: pain level controlled Vital Signs Assessment: post-procedure vital signs reviewed and stable Respiratory status: spontaneous breathing Cardiovascular status: blood pressure returned to baseline Postop Assessment: no headache and patient able to bend at knees Anesthetic complications: no Comments: Patient did not receive epidural catheter. I had just achieved loss of resistance to saline and was preparing to insert epidural catheter, when patient stated she needed to lie down; she had to push, so I removed epidural Tuohy needle,  stopped procedure. Nurse delivered infant at 0602 am   No notable events documented.   Last Vitals:  Vitals:   10/01/23 0605 10/01/23 0610  BP: (!) 108/55   Pulse: (!) 123   Temp:    SpO2: 100% 100%    Last Pain:  Vitals:   10/01/23 0035  TempSrc: Oral                 [name] C Tyren Dugar

## 2023-10-01 NOTE — Lactation Note (Signed)
This note was copied from a baby's chart. Lactation Consultation Note  Patient Name: Devoiry Corriher [id number] Date: 10/01/2023 Age:28 hours Reason for consult: Initial assessment;Early term 37-38.6wks   Maternal Data Has patient been taught Hand Expression?: Yes (Patient stated that she knows how to do hand expression.) Does the patient have breastfeeding experience prior to this delivery?: Yes How long did the patient breastfeed?: Exclusively pumped 6 months (1st)  Lactation to room for an initial assessment w/ a P3 patient and a 5hr old baby boy at [redacted]w[redacted]d.  This was a SVD.  Patient had a fast pushing stage.  Infant had one early low blood sugar. Feeding goal for patient is breastfeeding.    Patient stated to [facility] that she used nipple shields with her last children and was also an exclusive pumper.  Patient has several pumps at home; manual, portable, and a DEBP.    Feeding Mother's Current Feeding Choice: Breast Milk and Formula  LC assisted mom with putting infant to the left breast in football hold. Several attempts were made but infant did wake for a feed.  LC had patient put infant STS and re-try a feeding in 15-50 minutes.  After attempts patient requested a DEBP in the room.   A 20mm nipple was given during the attempts to help infant at the breast.  But he did not open mouth or show any interest.  Patient can easily express milk out.  Patient did hand express milk out and LC fed it back to baby.   Interventions Interventions: Breast feeding basics reviewed;Skin to skin;Support pillows;Adjust position;Position options;DEBP;Education  DEBP was given to mom but no education was provided.  Patient stated that she knew how to use the pump.   LC provided education on the following;  milk production expectations, hunger cues, day 1/2 wet/dirty diapers, hand expression, cluster feeding, benefits of STS and arousing infant for a feeding.  Lactation informed patient of feeding infant at least  8 or more times w/in a 24hr period but not exceeding 3hrs. Patient verbalized understanding.     Discharge Pump: Personal;Hands Free;Manual;DEBP WIC Program: Yes  Consult Status Consult Status: Follow-up Follow-up type: In-patient    Yvette Rack Free 10/01/2023, 1:53 PM

## 2023-10-02 ENCOUNTER — Encounter: Payer: Self-pay | Admitting: Obstetrics and Gynecology

## 2023-10-02 LAB — GLUCOSE, CAPILLARY: Glucose-Capillary: 99 mg/dL (ref 70–99)

## 2023-10-02 LAB — CBC
HCT: 31.2 % — ABNORMAL LOW (ref 36.0–46.0)
Hemoglobin: 9.9 g/dL — ABNORMAL LOW (ref 12.0–15.0)
MCH: 26.8 pg (ref 26.0–34.0)
MCHC: 31.7 g/dL (ref 30.0–36.0)
MCV: 84.6 fL (ref 80.0–100.0)
Platelets: 187 10*3/uL (ref 150–400)
RBC: 3.69 MIL/uL — ABNORMAL LOW (ref 3.87–5.11)
RDW: 15.4 % (ref 11.5–15.5)
WBC: 10.2 10*3/uL (ref 4.0–10.5)
nRBC: 0 % (ref 0.0–0.2)

## 2023-10-02 MED ORDER — FERROUS SULFATE 325 (65 FE) MG PO TABS
325.0000 mg | ORAL_TABLET | Freq: Two times a day (BID) | ORAL | Status: DC
  Administered 2023-10-02: 325 mg via ORAL
  Filled 2023-10-02: qty 1

## 2023-10-02 MED ORDER — PRENATAL MULTIVITAMIN CH: 1.0000 | ORAL_TABLET | Freq: Every day | ORAL | Status: DC

## 2023-10-02 MED ORDER — ACETAMINOPHEN 325 MG PO TABS: 650.0000 mg | ORAL_TABLET | ORAL | Status: DC | PRN

## 2023-10-02 MED ORDER — IBUPROFEN 600 MG PO TABS: 600.0000 mg | ORAL_TABLET | Freq: Four times a day (QID) | ORAL | 0 refills | Status: DC | PRN

## 2023-10-02 NOTE — Discharge Instructions (Signed)
Vaginal Delivery, Care After Refer to this sheet in the next few weeks. These discharge instructions provide you with information on caring for yourself after delivery. Your caregiver may also give you specific instructions. Your treatment has been planned according to the most current medical practices available, but problems sometimes occur. Call your caregiver if you have any problems or questions after you go home. HOME CARE INSTRUCTIONS Take over-the-counter or prescription medicines only as directed by your caregiver or pharmacist. Do not drink alcohol, especially if you are breastfeeding or taking medicine to relieve pain. Do not smoke tobacco. Continue to use good perineal care. Good perineal care includes: Wiping your perineum from back to front Keeping your perineum clean. You can do sitz baths twice a day, to help keep this area clean Do not use tampons, douche or have sex until your caregiver says it is okay. Shower only and avoid sitting in submerged water, aside from sitz baths Wear a well-fitting bra that provides breast support. Eat healthy foods. Drink enough fluids to keep your urine clear or pale yellow. Eat high-fiber foods such as whole grain cereals and breads, brown rice, beans, and fresh fruits and vegetables every day. These foods may help prevent or relieve constipation. Avoid constipation with high fiber foods or medications, such as miralax or metamucil Follow your caregiver's recommendations regarding resumption of activities such as climbing stairs, driving, lifting, exercising, or traveling. Talk to your caregiver about resuming sexual activities. Resumption of sexual activities is dependent upon your risk of infection, your rate of healing, and your comfort and desire to resume sexual activity. Try to have someone help you with your household activities and your newborn for at least a few days after you leave the hospital. Rest as much as possible. Try to rest or  take a nap when your newborn is sleeping. Increase your activities gradually. Keep all of your scheduled postpartum appointments. It is very important to keep your scheduled follow-up appointments. At these appointments, your caregiver will be checking to make sure that you are healing physically and emotionally. SEEK MEDICAL CARE IF:  You are passing large clots from your vagina. Save any clots to show your caregiver. You have a foul smelling discharge from your vagina. You have trouble urinating. You are urinating frequently. You have pain when you urinate. You have a change in your bowel movements. You have increasing redness, pain, or swelling near your vaginal incision (episiotomy) or vaginal tear. You have pus draining from your episiotomy or vaginal tear. Your episiotomy or vaginal tear is separating. You have painful, hard, or reddened breasts. You have a severe headache. You have blurred vision or see spots. You feel sad or depressed. You have thoughts of hurting yourself or your newborn. You have questions about your care, the care of your newborn, or medicines. You are dizzy or light-headed. You have a rash. You have nausea or vomiting. You were breastfeeding and have not had a menstrual period within 12 weeks after you stopped breastfeeding. You are not breastfeeding and have not had a menstrual period by the 12th week after delivery. You have a fever. SEEK IMMEDIATE MEDICAL CARE IF:  You have persistent pain. You have chest pain. You have shortness of breath. You faint. You have leg pain. You have stomach pain. Your vaginal bleeding saturates two or more sanitary pads in 1 hour. MAKE SURE YOU:  Understand these instructions. Will watch your condition. Will get help right away if you are not doing well or   get worse. Document Released: 11/13/2000 Document Revised: 04/02/2014 Document Reviewed: 07/13/2012 ExitCare Patient Information 2015 [employer]. This  information is not intended to replace advice given to you by your health care provider. Make sure you discuss any questions you have with your health care provider.  Sitz Bath A sitz bath is a warm water bath taken in the sitting position. The water covers only the hips and butt (buttocks). We recommend using one that fits in the toilet, to help with ease of use and cleanliness. It may be used for either healing or cleaning purposes. Sitz baths are also used to relieve pain, itching, or muscle tightening (spasms). The water may contain medicine. Moist heat will help you heal and relax.  HOME CARE  Take 3 to 4 sitz baths a day. Fill the bathtub half-full with warm water. Sit in the water and open the drain a little. Turn on the warm water to keep the tub half-full. Keep the water running constantly. Soak in the water for 15 to 20 minutes. After the sitz bath, pat the affected area dry. GET HELP RIGHT AWAY IF: You get worse instead of better. Stop the sitz baths if you get worse. MAKE SURE YOU: Understand these instructions. Will watch your condition. Will get help right away if you are not doing well or get worse. Document Released: 12/24/2004 Document Revised: 08/10/2012 Document Reviewed: 03/16/2011 Exit[employer] Patient Information 2015 [employer]. This information is not intended to replace advice given to you by your health care provider. Make sure you discuss any questions you have with your health care provider.   

## 2023-10-02 NOTE — Progress Notes (Signed)
Patient discharged home with family.  Discharge instructions, when to follow up, and prescriptions reviewed with patient.  Patient verbalized understanding. Patient will be escorted out by auxiliary.   

## 2023-10-02 NOTE — Lactation Note (Signed)
This note was copied from a baby's chart. Lactation Consultation Note  Patient Name: Gina Gomez [id number] Date: 10/02/2023 Age:28 hours Reason for consult: Follow-up assessment;Exclusive pumping and bottle feeding;Early term 37-38.6wks;Infant < 6lbs;Maternal discharge   Maternal Data Does the patient have breastfeeding experience prior to this delivery?: Yes How long did the patient breastfeed?: exclusive pumped and bottlefed x 6 mths  Feeding Mother's Current Feeding Choice: Breast Milk and Formula Nipple Type: Slow - flow Mom has been breastfeeding occasionally but mostly pumping her breasts and giving baby EBM and formula, baby had circ this am, I did not observe a breast feeding.    LATCH Score Latch:  (no breastfeeding observed)                  Lactation Tools Discussed/Used Tools: Pump Breast pump type: Double-Electric Breast Pump Reason for Pumping: exclusive pump and bottlefeed Pumping frequency: 8x 24 hr  Interventions  LC name updated on white board  Discharge Pump: DEBP;Personal WIC Program: Yes  Consult Status Consult Status: PRN Date: 10/02/23 Follow-up type: In-patient    Dyann Kief 10/02/2023, 11:57 AM

## 2023-10-15 ENCOUNTER — Other Ambulatory Visit: Payer: Self-pay | Admitting: Obstetrics and Gynecology

## 2023-10-18 NOTE — H&P (Signed)
Gina Gomez is a 27 y.o. female here for Discuss BTL . 2 weeks PP  and she is interested in BTL .  Alternative discussed in past   G3P3 s/p svd  No prior STI , pelvic infections or abd surgery  Medicaid form signed    Past Medical History:  has a past medical history of Anxiety, Cataract (03/03/2022), COVID-19 (2021), Decreased creatinine clearance (04/06/2022), Decreased creatinine clearance (04/06/2022), Gestational diabetes mellitus (GDM) affecting second pregnancy ([facility]CC) (2022), History of migraine, History of retinal detachment, Hypertension, Hypertriglyceridemia, and Obesity.  Past Surgical History:  has a past surgical history that includes No Past Surgeries. Family History: family history includes Cancer in her maternal grandmother; Cataracts in her mother; Diabetes in her paternal grandfather; Diabetes type II in her maternal grandfather; Glaucoma in her mother; Heart disease in her maternal grandfather and paternal grandmother; High blood pressure (Hypertension) in her father, maternal grandfather, and paternal grandmother; Hyperlipidemia (Elevated cholesterol) in her father, maternal grandfather, and paternal grandmother; Macular degeneration in her mother; Myocardial Infarction (Heart attack) in her father and paternal grandmother; Ovarian cysts in her mother. Social History:  reports that she has never smoked. She has never used smokeless tobacco. She reports that she does not drink alcohol and does not use drugs. OB/GYN History:  OB History       Gravida  3   Para  3   Term  3   Preterm      AB      Living  2        SAB      IAB      Ectopic      Molar      Multiple      Live Births  2             Allergies: has No Known Allergies. Medications:  Current Medications    Current Outpatient Medications:    blood glucose diagnostic test strip, 1 each (1 strip total) 4 (four) times daily Use as instructed. (Patient not taking: Reported on 10/15/2023),  Disp: 100 strip, Rfl: 6   blood glucose meter kit, as directed [employer] will cover (Patient not taking: Reported on 10/15/2023), Disp: 1 each, Rfl: 0   ferrous sulfate 325 (65 FE) MG tablet, Take 325 mg by mouth daily with breakfast (Patient not taking: Reported on 10/15/2023), Disp: , Rfl:    insulin GLARGINE (LANTUS SOLOSTAR U-100 INSULIN) pen injector (concentration 100 units/mL), Inject 23 Units subcutaneously at bedtime (Patient not taking: Reported on 10/15/2023), Disp: 15 mL, Rfl: 1   lancets, Use 1 each 4 (four) times daily Use as instructed. (Patient not taking: Reported on 10/15/2023), Disp: 100 each, Rfl: 6   NIFEdipine (PROCARDIA-XL) 30 MG (OSM) XL tablet, Take 1 tablet by mouth once daily (Patient not taking: Reported on 04/02/2023), Disp: , Rfl:    pen needle, diabetic 31 gauge x 3/16" needle, Use as directed (Patient not taking: Reported on 10/15/2023), Disp: 100 each, Rfl: 12   PRENATAL VIT37/IRON/FOLIC ACID (PRENATA ORAL), Take by mouth Reported on 12/13/2015 (Patient not taking: Reported on 07/29/2023), Disp: , Rfl:      Review of Systems: General:                      No fatigue or weight loss Eyes:                           No vision changes  Ears:                            No hearing difficulty Respiratory:                No cough or shortness of breath Pulmonary:                  No asthma or shortness of breath Cardiovascular:           No chest pain, palpitations, dyspnea on exertion Gastrointestinal:          No abdominal bloating, chronic diarrhea, constipations, masses, pain or hematochezia Genitourinary:             No hematuria, dysuria, abnormal vaginal discharge, pelvic pain, Menometrorrhagia Lymphatic:                   No swollen lymph nodes Musculoskeletal:No muscle weakness Neurologic:                  No extremity weakness, syncope, seizure disorder Psychiatric:                  No history of depression, delusions or suicidal/homicidal ideation       Exam:       Vitals:    10/15/23 1417  BP: 134/84  Pulse: (!) 112      Body mass index is 44.45 kg/m.   WDWN white/ female in NAD   Lungs: CTA  CV : RRR without murmur   Pelvic deferred  Impression:    The encounter diagnosis was Sterilization consult.       Plan:    Spoke to her about the procedure and falope or cautery will be used .  All questions answered  Benefits and risks to surgery: L/s BTL with falope or cautery  The proposed benefit of the surgery has been discussed with the patient.  Failure rate 1/300/ yr discussed  The possible risks include, but are not limited to: organ injury to the bowel , bladder, ureters, and major blood vessels and nerves. There is a possibility of additional surgeries resulting from these injuries. There is also the risk of blood transfusion and the need to receive blood products during or after the procedure which may rarely lead to HIV or Hepatitis C infection. There is a risk of developing a deep venous thrombosis or a pulmonary embolism . There is the possibility of wound infection and also anesthetic complications, even the rare possibility of death. The patient understands these risks and wishes to proceed. All questions have been answered and the consent has been signed.            Vilma Prader, MD

## 2023-10-22 ENCOUNTER — Inpatient Hospital Stay: Admit: 2023-10-22 | Payer: Medicaid Other

## 2023-10-28 IMAGING — CT CT HEAD W/O CM
4 series · 16 of 47 positions shown, 18 images · non-contrast
Comparison: None.

CLINICAL DATA: Headache and nausea.



[Series 2: head wo · axial · 0.39mm/px · z∈[-137,-32]mm · 7 of 29 slices shown, 9 images]
[im 4/29  brain]
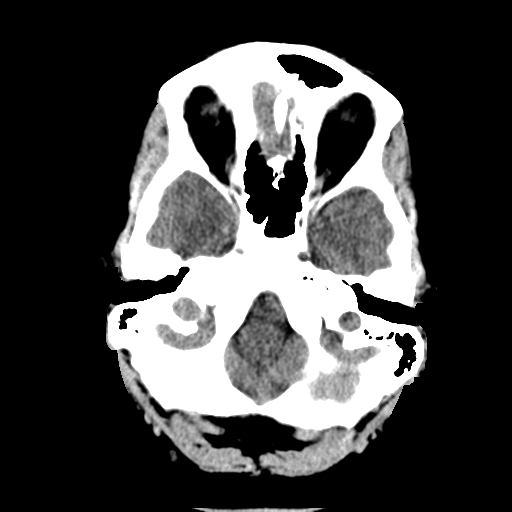
[im 4/29  bone]
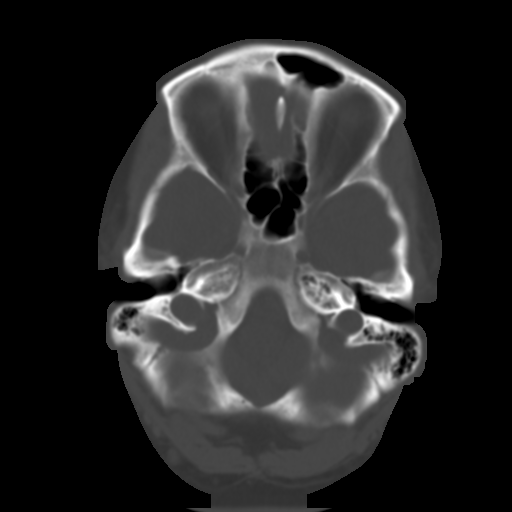
[im 8/29  brain]
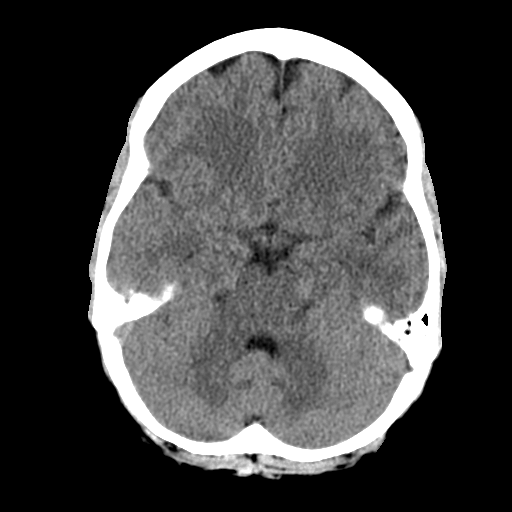
[im 11/29  brain]
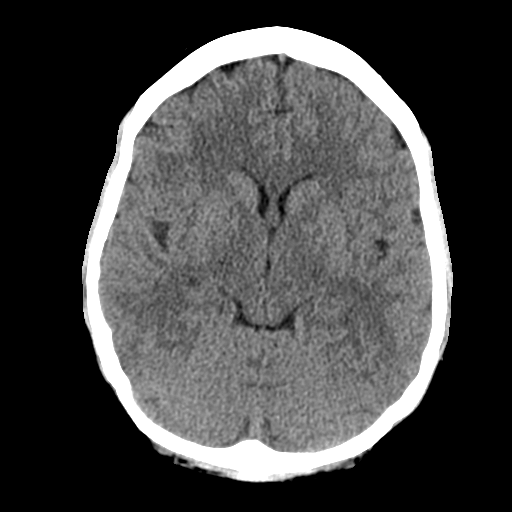
[im 15/29  brain]
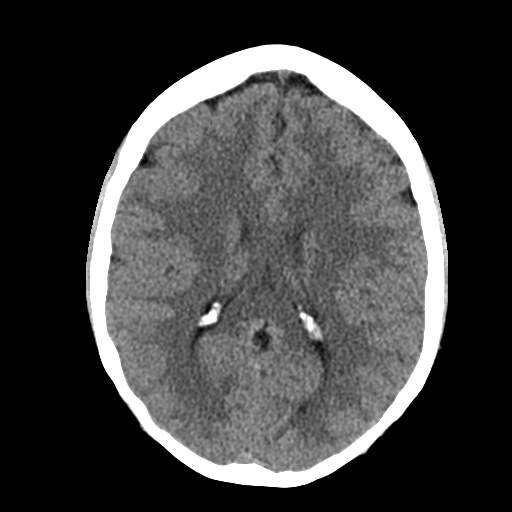
[im 18/29  brain]
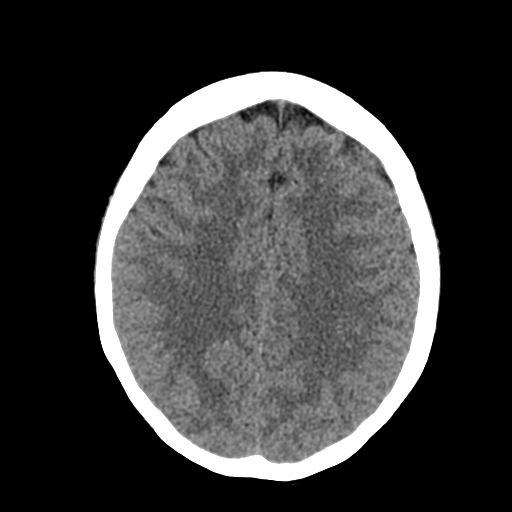
[im 18/29  bone]
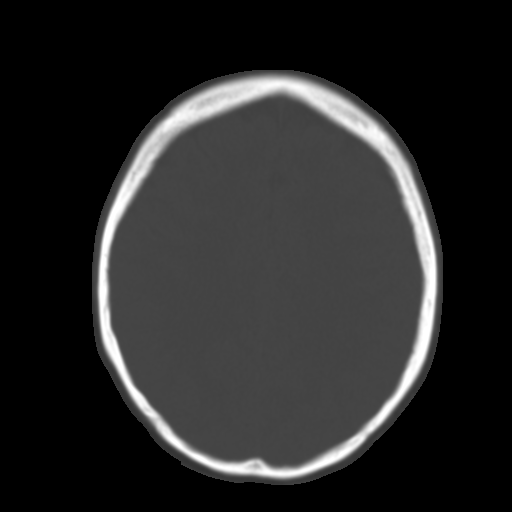
[im 22/29  brain]
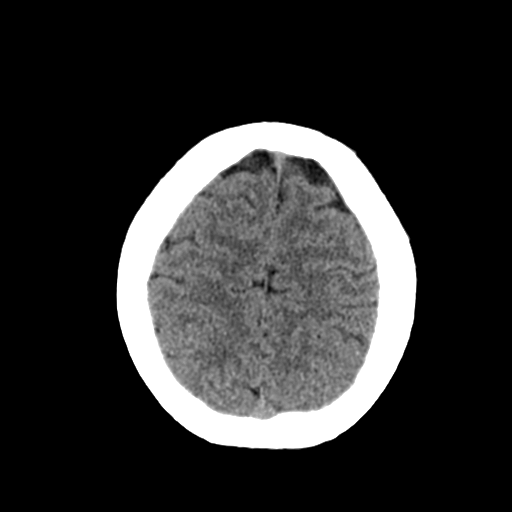
[im 25/29  brain]
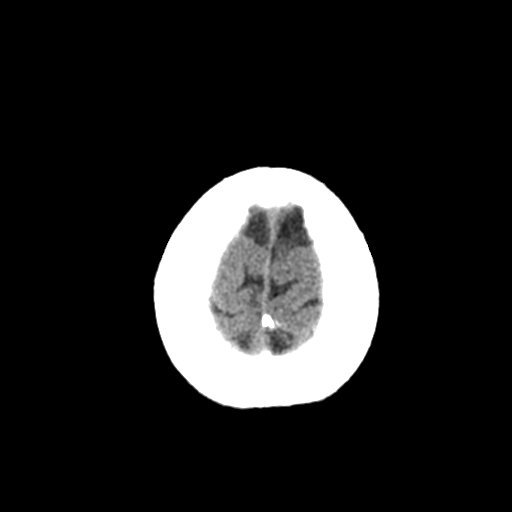

[Series 3: head bone · axial · 0.39mm/px · z∈[-138,-110]mm · 3 of 71 slices shown]
[im 8/71  bone]
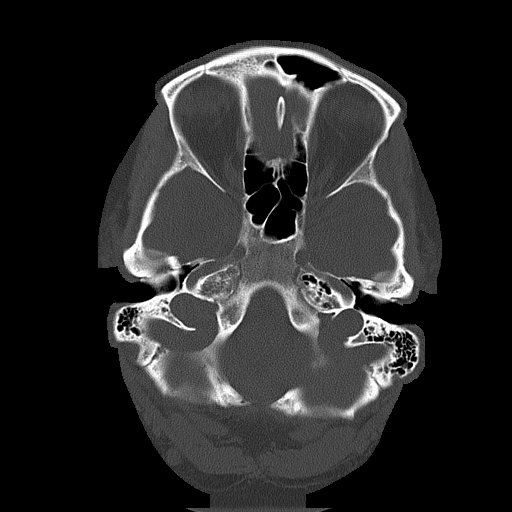
[im 15/71  bone]
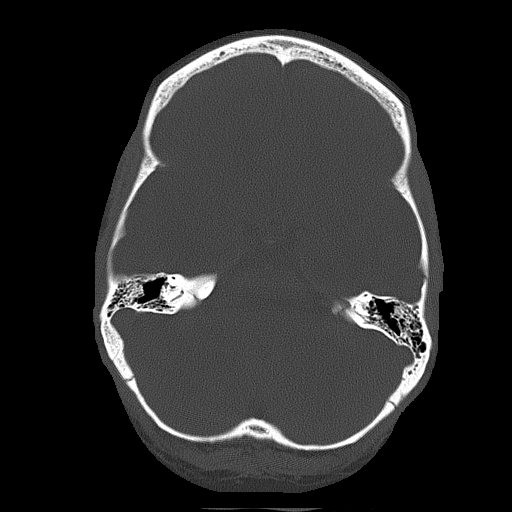
[im 22/71  bone]
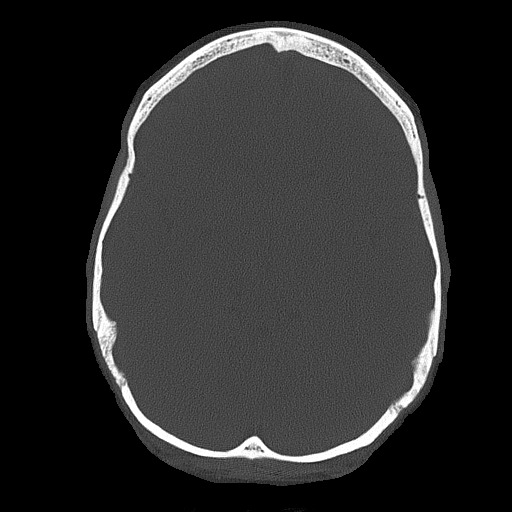

[Series 4: coronal soft tissue · coronal · 0.29mm/px · 3 of 65 slices shown]
[im 22/65  brain]
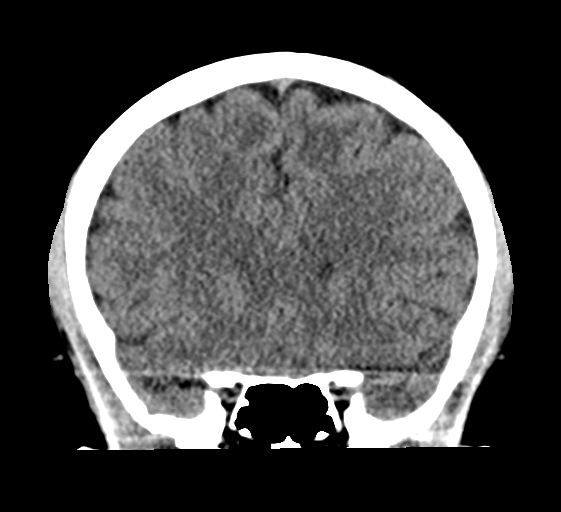
[im 29/65  brain]
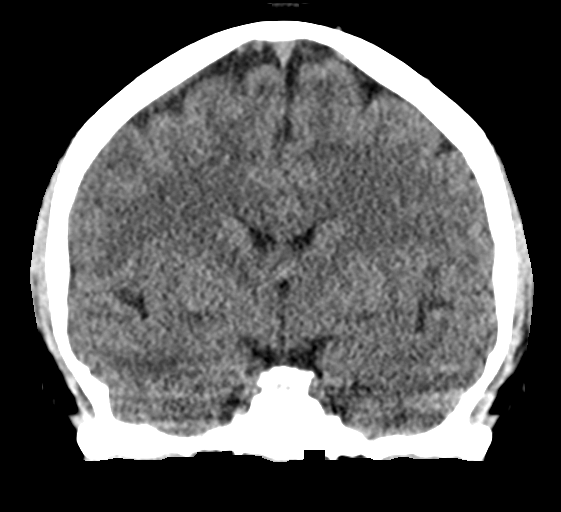
[im 36/65  brain]
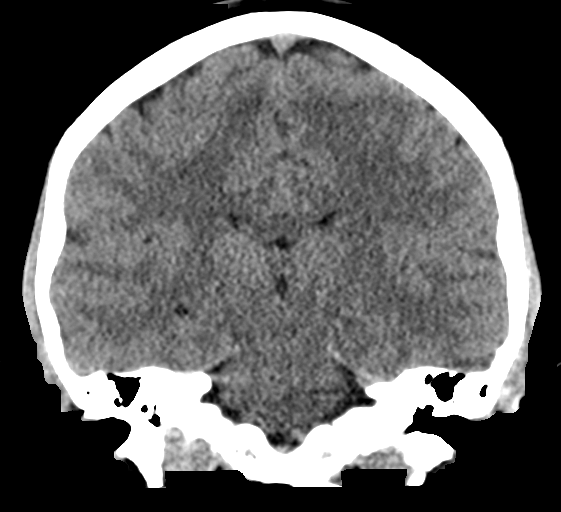

[Series 5: sagittal soft tissue · sagittal · 0.31mm/px · 3 of 54 slices shown]
[im 18/54  brain]
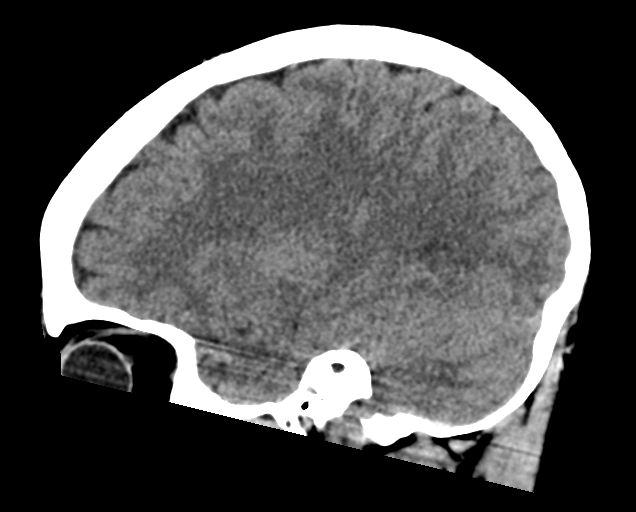
[im 27/54  brain]
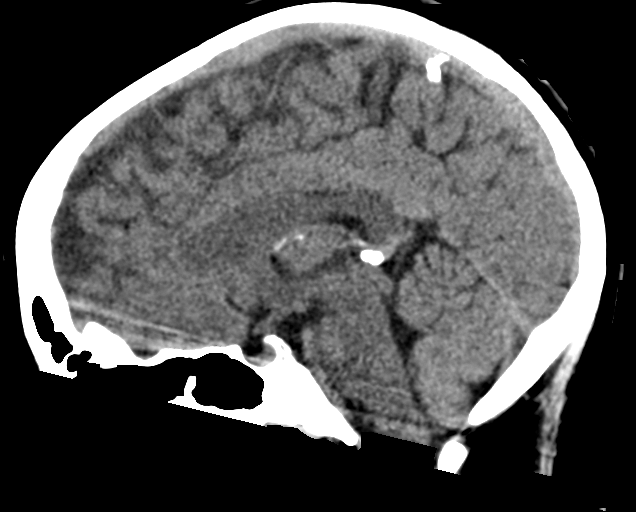
[im 36/54  brain]
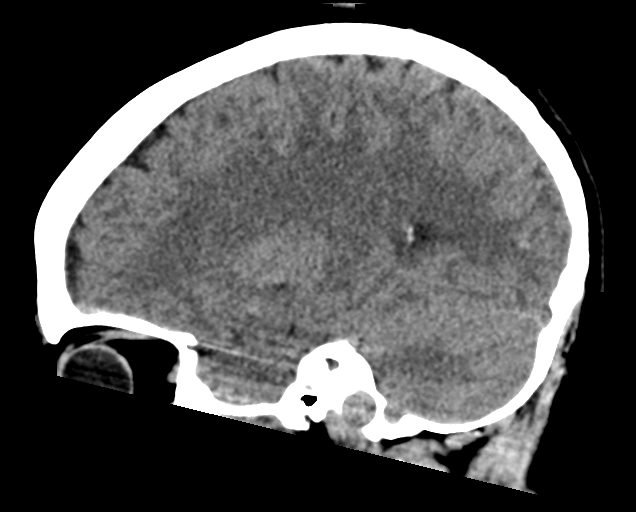

[16 of 47 positions shown; findings below may reference images not displayed]

FINDINGS: CT HEAD FINDINGS

Brain: The ventricles are normal in size and configuration. No
extra-axial fluid collections are identified. The gray-white
differentiation is maintained. No CT findings for acute hemispheric
infarction or intracranial hemorrhage. No mass lesions. The
brainstem and cerebellum are normal.

Vascular: No hyperdense vessels or obvious aneurysm.

Skull: No acute skull fracture.  No bone lesion.

Sinuses/Orbits: The paranasal sinuses and mastoid air cells are
clear. The globes are intact.

Other: No scalp lesions, laceration or hematoma.

CT CERVICAL SPINE FINDINGS

Alignment: Normal

Skull base and vertebrae: No acute fracture. No primary bone lesion
or focal pathologic process.

Soft tissues and spinal canal: No prevertebral fluid or swelling. No
visible canal hematoma.

Disc levels: The spinal canal is generous. No large disc protrusions
or canal stenosis.

Upper chest: The lung apices are grossly clear.

Other: No neck mass, adenopathy or hematoma.
IMPRESSION: Normal CT scan of the head and cervical spine.

## 2023-10-28 IMAGING — CT CT CERVICAL SPINE W/O CM
3 of 4 series · 9 of 33 positions shown, 11 images · non-contrast
Comparison: None.

CLINICAL DATA: Headache and nausea.



[Series 6: sagittal bone · sagittal · 0.21mm/px · 5 of 59 slices shown, 6 images]
[im 20/59  bone]
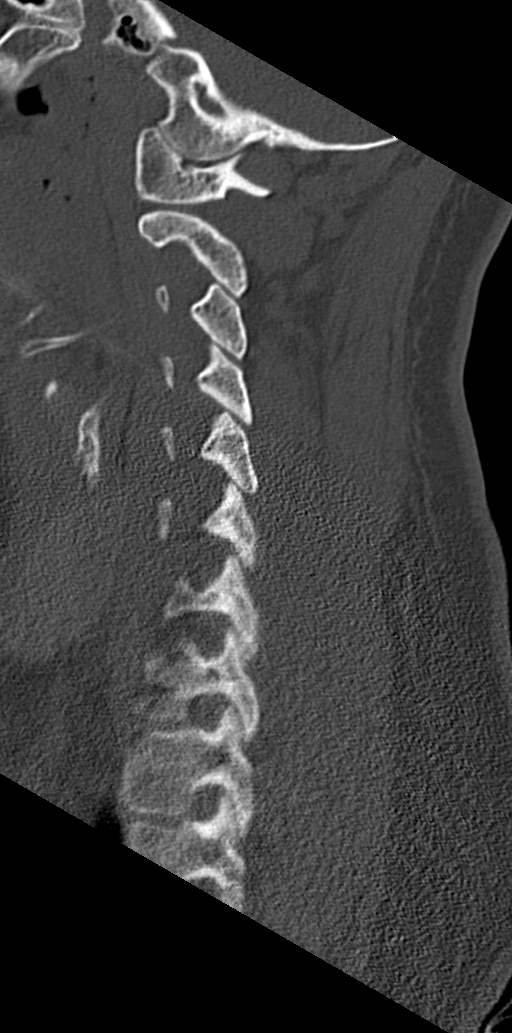
[im 25/59  bone]
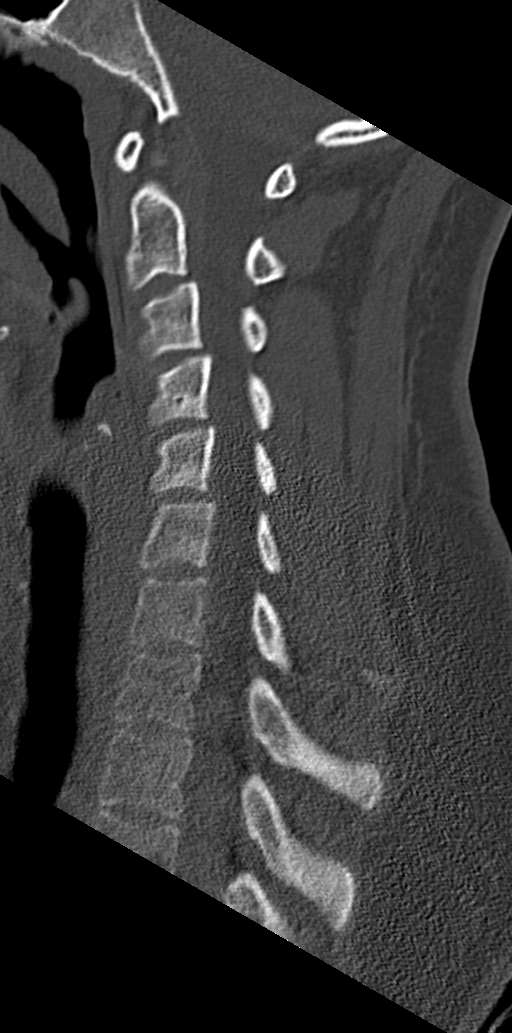
[im 30/59  soft-tissue]
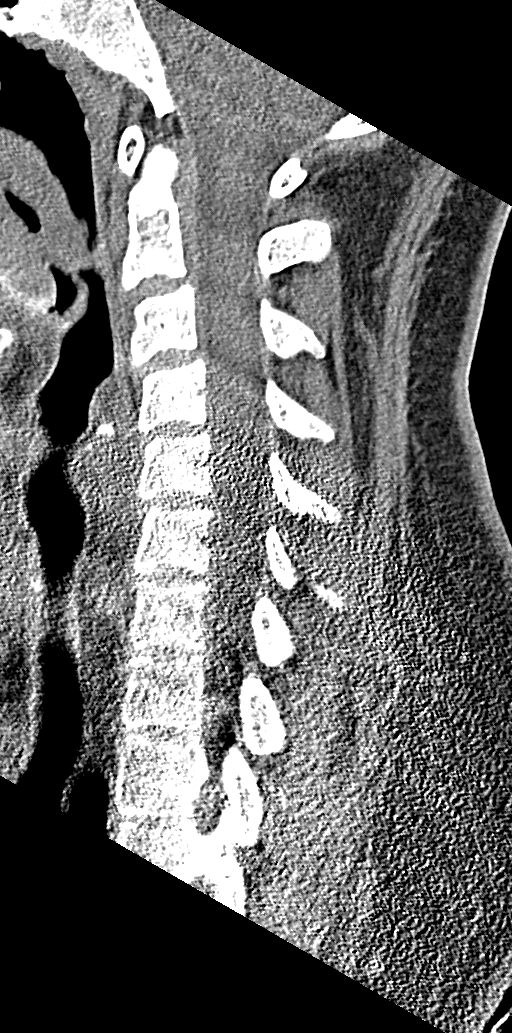
[im 30/59  bone]
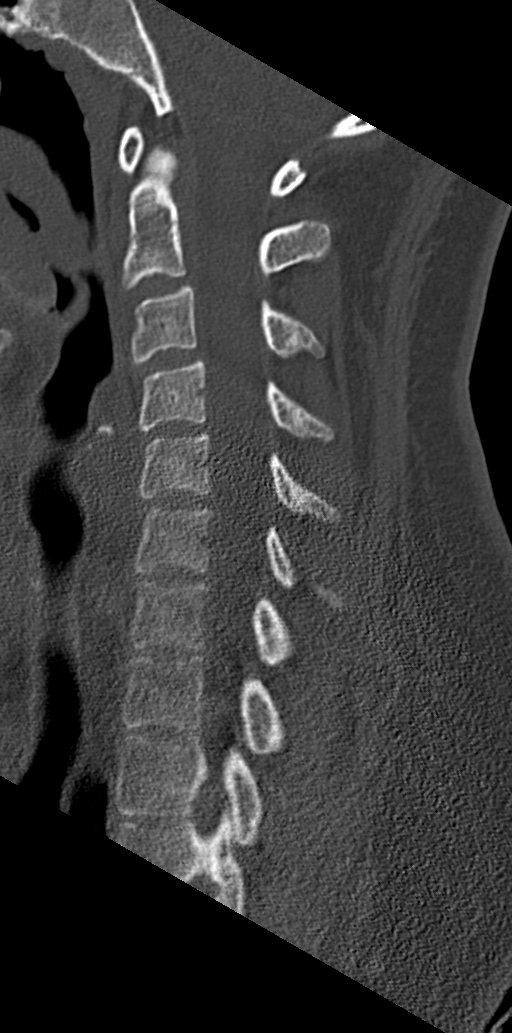
[im 34/59  bone]
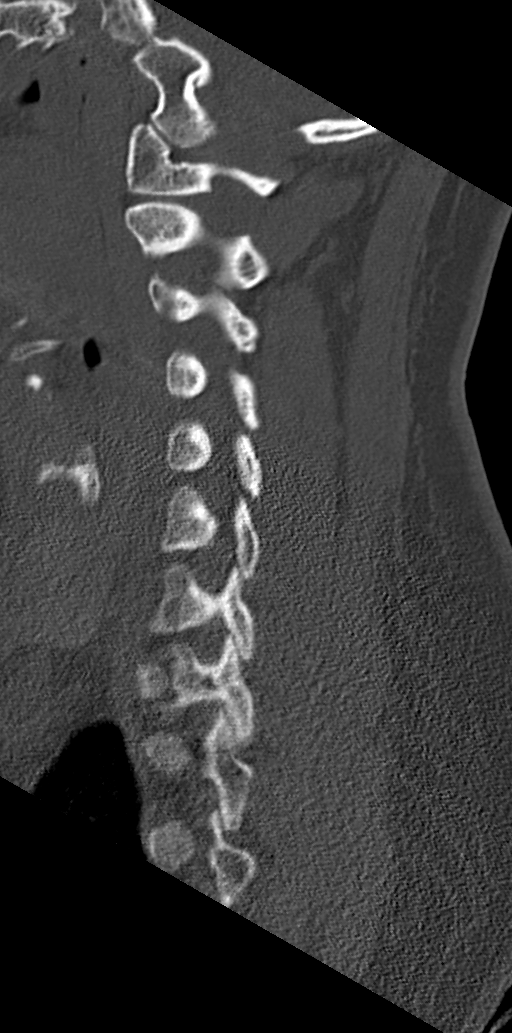
[im 39/59  bone]
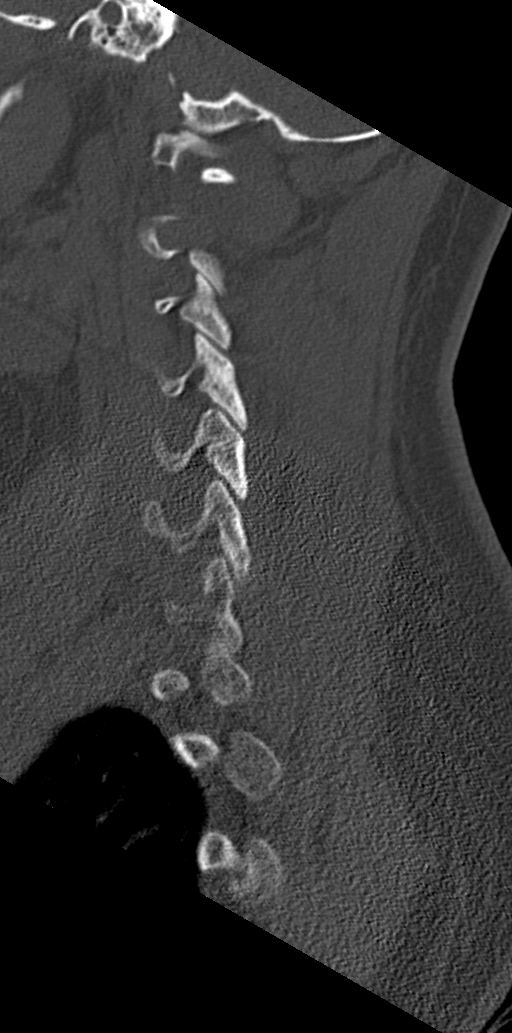

[Series 7: coronal bone · coronal · 0.23mm/px · 3 of 55 slices shown]
[im 13/55  bone]
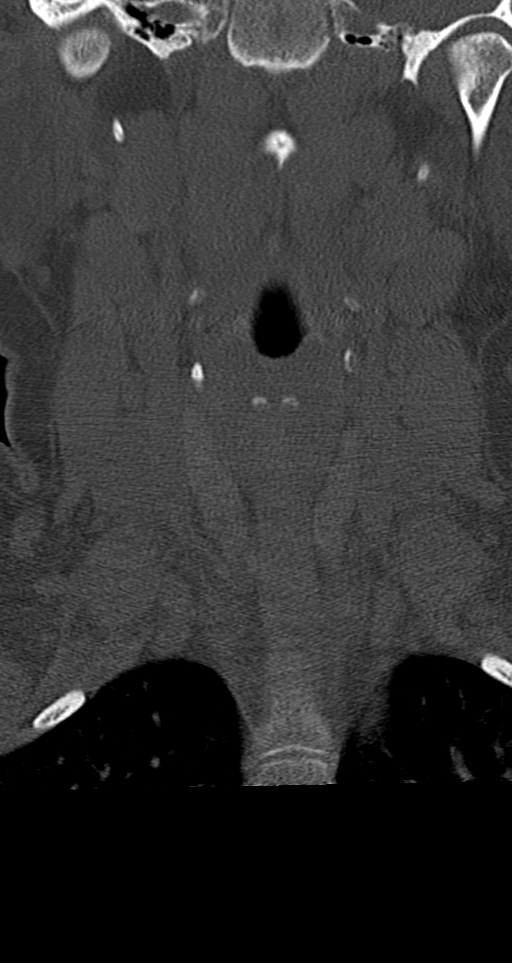
[im 23/55  bone]
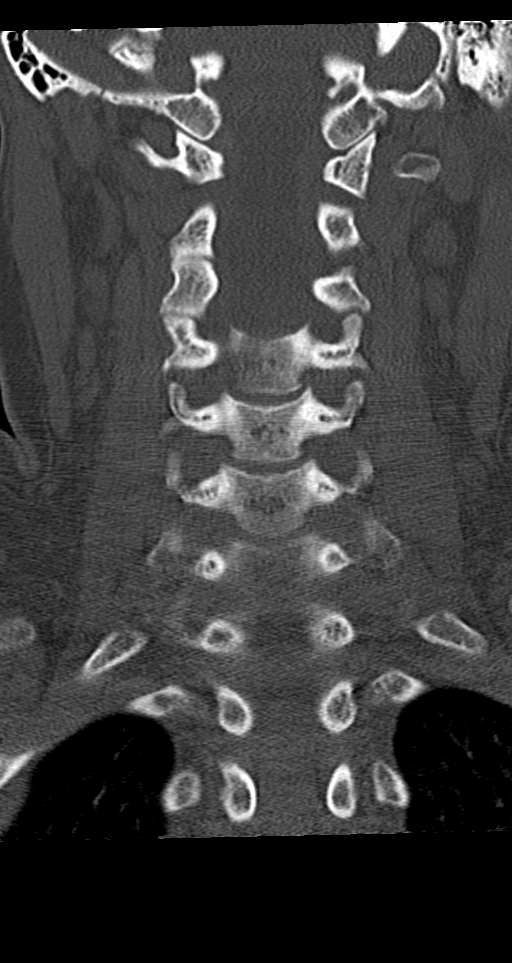
[im 32/55  bone]
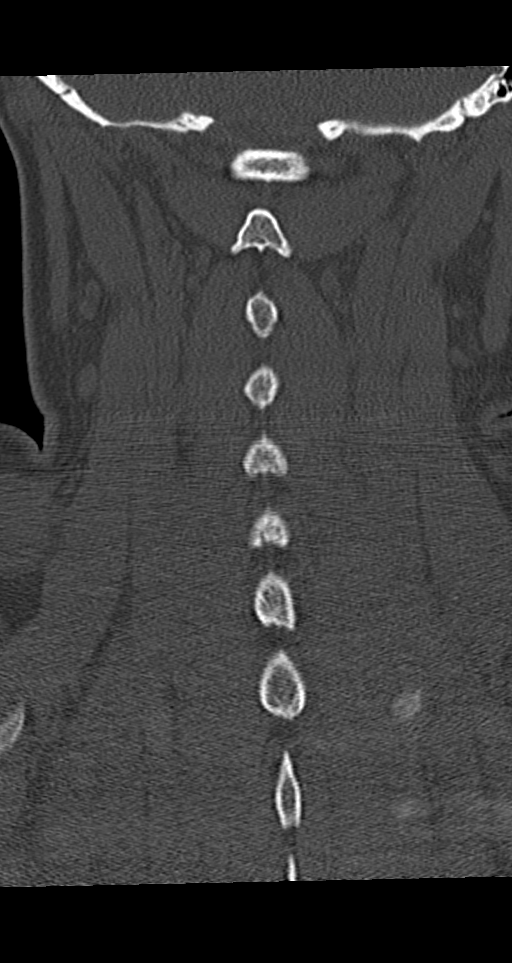

[Series 8: orthogonal bone · axial · 0.21mm/px · z∈[-234,-234]mm · 1 of 111 slices shown, 2 images]
[im 63/111  soft-tissue]
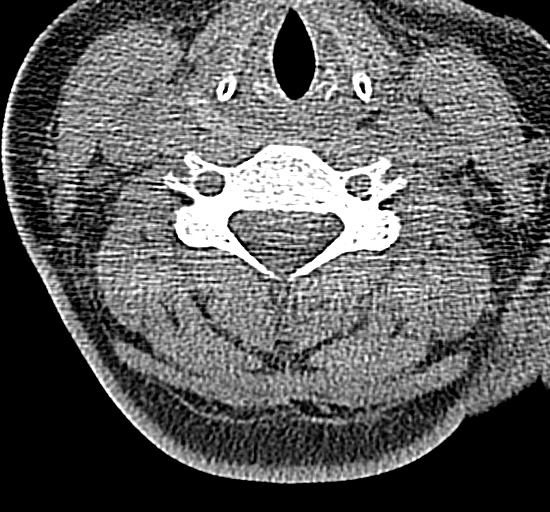
[im 63/111  bone]
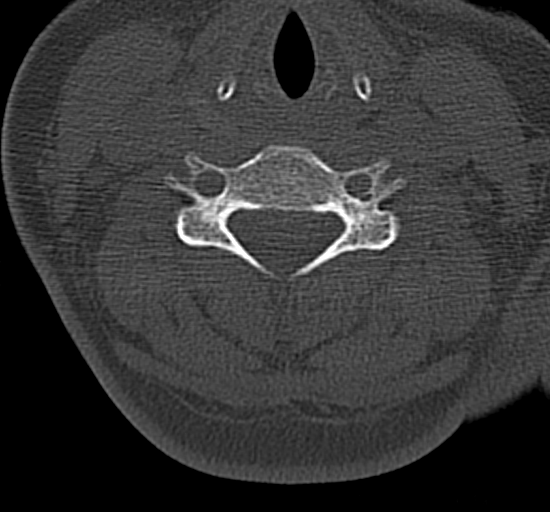

[9 of 33 positions shown; findings below may reference images not displayed]

FINDINGS: CT HEAD FINDINGS

Brain: The ventricles are normal in size and configuration. No
extra-axial fluid collections are identified. The gray-white
differentiation is maintained. No CT findings for acute hemispheric
infarction or intracranial hemorrhage. No mass lesions. The
brainstem and cerebellum are normal.

Vascular: No hyperdense vessels or obvious aneurysm.

Skull: No acute skull fracture.  No bone lesion.

Sinuses/Orbits: The paranasal sinuses and mastoid air cells are
clear. The globes are intact.

Other: No scalp lesions, laceration or hematoma.

CT CERVICAL SPINE FINDINGS

Alignment: Normal

Skull base and vertebrae: No acute fracture. No primary bone lesion
or focal pathologic process.

Soft tissues and spinal canal: No prevertebral fluid or swelling. No
visible canal hematoma.

Disc levels: The spinal canal is generous. No large disc protrusions
or canal stenosis.

Upper chest: The lung apices are grossly clear.

Other: No neck mass, adenopathy or hematoma.
IMPRESSION: Normal CT scan of the head and cervical spine.

## 2023-11-04 ENCOUNTER — Other Ambulatory Visit: Payer: Self-pay

## 2023-11-04 ENCOUNTER — Encounter
Admission: RE | Admit: 2023-11-04 | Discharge: 2023-11-04 | Disposition: A | Discharge status: Home / Self Care | Payer: Medicaid Other | Source: Ambulatory Visit | Attending: Obstetrics and Gynecology | Admitting: Obstetrics and Gynecology

## 2023-11-04 VITALS — Ht 62.0 in | Wt 230.0 lb

## 2023-11-04 DIAGNOSIS — E781 Pure hyperglyceridemia: Secondary | ICD-10-CM

## 2023-11-04 DIAGNOSIS — Z01818 Encounter for other preprocedural examination: Secondary | ICD-10-CM

## 2023-11-04 DIAGNOSIS — O10919 Unspecified pre-existing hypertension complicating pregnancy, unspecified trimester: Secondary | ICD-10-CM

## 2023-11-04 HISTORY — DX: Unspecified asthma, uncomplicated: J45.909

## 2023-11-04 NOTE — Patient Instructions (Addendum)
Your procedure is scheduled on: Thursday 11/11/23 To find out your arrival time, please call [phone] between 1PM - 3PM on:  Wednesday 11/10/23  Report to the Registration Desk on the 1st floor of the [facility]. Free Valet parking is available.  If your arrival time is 6:00 am, do not arrive before that time as the [facility] entrance doors do not open until 6:00 am.  REMEMBER: Instructions that are not followed completely may result in serious medical risk, up to and including death; or upon the discretion of your surgeon and anesthesiologist your surgery may need to be rescheduled.  Do not eat food after midnight the night before surgery.  No gum chewing or hard candies.  You may however, drink CLEAR liquids up to 2 hours before you are scheduled to arrive for your surgery. Do not drink anything within 2 hours of your scheduled arrival time.  Clear liquids include: - water  - apple juice without pulp - gatorade (not RED colors) - black coffee or tea (Do NOT add milk or creamers to the coffee or tea) Do NOT drink anything that is not on this list.  Type 1 and Type 2 diabetics should only drink water.  One week prior to surgery: Stop Anti-inflammatories (NSAIDS) such as Advil, Aleve, Ibuprofen, Motrin, Naproxen, Naprosyn and Aspirin based products such as Excedrin, Goody's Powder, BC Powder. You may however, continue to take Tylenol if needed for pain up until the day of surgery.  Stop ANY OVER THE COUNTER supplements and vitamins for 7 days until after surgery.  Continue taking all prescribed medications.   TAKE ONLY THESE MEDICATIONS THE MORNING OF SURGERY WITH A SIP OF WATER:  none  No Alcohol for 24 hours before or after surgery.  No Smoking including e-cigarettes for 24 hours before surgery.  No chewable tobacco products for at least 6 hours before surgery.  No nicotine patches on the day of surgery.  Do not use any "recreational" drugs for at least a week  (preferably 2 weeks) before your surgery.  Please be advised that the combination of cocaine and anesthesia may have negative outcomes, up to and including death. If you test positive for cocaine, your surgery will be cancelled.  On the morning of surgery brush your teeth with toothpaste and water, you may rinse your mouth with mouthwash if you wish. Do not swallow any toothpaste or mouthwash.  Use CHG Soap or wipes as directed on instruction sheet.  Do not wear lotions, powders, or perfumes.   Do not shave body hair from the neck down 48 hours before surgery.  Wear clean comfortable clothing (specific to your surgery type) to the hospital.  Do not wear jewelry, make-up, hairpins, clips or nail polish.  For welded (permanent) jewelry: bracelets, anklets, waist bands, etc.  Please have this removed prior to surgery.  If it is not removed, there is a chance that hospital personnel will need to cut it off on the day of surgery. Contact lenses, hearing aids and dentures may not be worn into surgery.  Do not bring valuables to the hospital. [facility] is not responsible for any missing/lost belongings or valuables.   Notify your doctor if there is any change in your medical condition (cold, fever, infection).  If you are being discharged the day of surgery, you will not be allowed to drive home. You will need a responsible individual to drive you home and stay with you for 24 hours after surgery.  If you are taking public transportation, you will need to have a responsible individual with you.  If you are being admitted to the hospital overnight, leave your suitcase in the car. After surgery it may be brought to your room.  In case of increased patient census, it may be necessary for you, the patient, to continue your postoperative care in the Same Day Surgery department.  After surgery, you can help prevent lung complications by doing breathing exercises.  Take deep breaths and cough  every 1-2 hours. Your doctor may order a device called an Incentive Spirometer to help you take deep breaths. When coughing or sneezing, hold a pillow firmly against your incision with both hands. This is called "splinting." Doing this helps protect your incision. It also decreases belly discomfort.  Surgery Visitation Policy:  Patients undergoing a surgery or procedure may have two family members or support persons with them as long as the person is not COVID-19 positive or experiencing its symptoms.   Inpatient Visitation:    Visiting hours are 7 a.m. to 8 p.m. Up to four visitors are allowed at one time in a patient room. The visitors may rotate out with other people during the day. One designated support person (adult) may remain overnight.  Please call the Pre-admissions Testing Dept. at [phone] if you have any questions about these instructions.     Preparing for Surgery with CHLORHEXIDINE GLUCONATE (CHG) Soap  Chlorhexidine Gluconate (CHG) Soap  o An antiseptic cleaner that kills germs and bonds with the skin to continue killing germs even after washing  o Used for showering the night before surgery and morning of surgery  Before surgery, you can play an important role by reducing the number of germs on your skin.  CHG (Chlorhexidine gluconate) soap is an antiseptic cleanser which kills germs and bonds with the skin to continue killing germs even after washing.  Please do not use if you have an allergy to CHG or antibacterial soaps. If your skin becomes reddened/irritated stop using the CHG.  1. Shower the NIGHT BEFORE SURGERY and the MORNING OF SURGERY with CHG soap.  2. If you choose to wash your hair, wash your hair first as usual with your normal shampoo.  3. After shampooing, rinse your hair and body thoroughly to remove the shampoo.  4. Use CHG as you would any other liquid soap. You can apply CHG directly to the skin and wash gently with a scrungie or a clean  washcloth.  5. Apply the CHG soap to your body only from the neck down. Do not use on open wounds or open sores. Avoid contact with your eyes, ears, mouth, and genitals (private parts). Wash face and genitals (private parts) with your normal soap.  6. Wash thoroughly, paying special attention to the area where your surgery will be performed.  7. Thoroughly rinse your body with warm water.  8. Do not shower/wash with your normal soap after using and rinsing off the CHG soap.  9. Pat yourself dry with a clean towel.  10. Wear clean pajamas to bed the night before surgery.  12. Place clean sheets on your bed the night of your first shower and do not sleep with pets.  13. Shower again with the CHG soap on the day of surgery prior to arriving at the hospital.  14. Do not apply any deodorants/lotions/powders.  15. Please wear clean clothes to the hospital.

## 2023-11-05 ENCOUNTER — Inpatient Hospital Stay: Admission: RE | Admit: 2023-11-05 | Payer: Medicaid Other | Source: Ambulatory Visit

## 2023-11-10 ENCOUNTER — Encounter: Payer: Self-pay | Admitting: Urgent Care

## 2023-11-10 ENCOUNTER — Encounter
Admission: RE | Admit: 2023-11-10 | Discharge: 2023-11-10 | Disposition: A | Discharge status: Home / Self Care | Payer: Medicaid Other | Source: Ambulatory Visit | Attending: Obstetrics and Gynecology | Admitting: Obstetrics and Gynecology

## 2023-11-10 DIAGNOSIS — R9431 Abnormal electrocardiogram [ECG] [EKG]: Secondary | ICD-10-CM | POA: Insufficient documentation

## 2023-11-10 DIAGNOSIS — E781 Pure hyperglyceridemia: Secondary | ICD-10-CM | POA: Insufficient documentation

## 2023-11-10 DIAGNOSIS — Z01818 Encounter for other preprocedural examination: Secondary | ICD-10-CM | POA: Insufficient documentation

## 2023-11-10 DIAGNOSIS — Z01810 Encounter for preprocedural cardiovascular examination: Secondary | ICD-10-CM | POA: Diagnosis not present

## 2023-11-10 DIAGNOSIS — O10919 Unspecified pre-existing hypertension complicating pregnancy, unspecified trimester: Secondary | ICD-10-CM

## 2023-11-10 LAB — CBC
HCT: 38.6 % (ref 36.0–46.0)
Hemoglobin: 12.2 g/dL (ref 12.0–15.0)
MCH: 26.3 pg (ref 26.0–34.0)
MCHC: 31.6 g/dL (ref 30.0–36.0)
MCV: 83.2 fL (ref 80.0–100.0)
Platelets: 237 10*3/uL (ref 150–400)
RBC: 4.64 MIL/uL (ref 3.87–5.11)
RDW: 13.8 % (ref 11.5–15.5)
WBC: 6.7 10*3/uL (ref 4.0–10.5)
nRBC: 0 % (ref 0.0–0.2)

## 2023-11-10 LAB — BASIC METABOLIC PANEL
Anion gap: 9 (ref 5–15)
BUN: 10 mg/dL (ref 6–20)
CO2: 23 mmol/L (ref 22–32)
Calcium: 8.5 mg/dL — ABNORMAL LOW (ref 8.9–10.3)
Chloride: 107 mmol/L (ref 98–111)
Creatinine, Ser: 0.6 mg/dL (ref 0.44–1.00)
GFR, Estimated: >60 mL/min (ref >60)
Glucose, Bld: 81 mg/dL (ref 70–99)
Potassium: 3.8 mmol/L (ref 3.5–5.1)
Sodium: 139 mmol/L (ref 135–145)

## 2023-11-10 LAB — TYPE AND SCREEN
ABO/RH(D): O POS
Antibody Screen: NEGATIVE
Extend sample reason: UNDETERMINED

## 2023-11-10 LAB — HIV ANTIBODY (ROUTINE TESTING W REFLEX): HIV Screen 4th Generation wRfx: NONREACTIVE

## 2023-11-11 ENCOUNTER — Ambulatory Visit
Admission: RE | Admit: 2023-11-11 | Discharge: 2023-11-11 | Disposition: A | Discharge status: Home / Self Care | Payer: Medicaid Other | Attending: Obstetrics and Gynecology | Admitting: Obstetrics and Gynecology

## 2023-11-11 ENCOUNTER — Other Ambulatory Visit: Payer: Self-pay

## 2023-11-11 ENCOUNTER — Ambulatory Visit: Payer: Medicaid Other | Admitting: General Practice

## 2023-11-11 ENCOUNTER — Encounter: Payer: Self-pay | Admitting: Obstetrics and Gynecology

## 2023-11-11 ENCOUNTER — Encounter
Admission: RE | Disposition: A | Discharge status: Home / Self Care | Payer: Self-pay | Source: Home / Self Care | Attending: Obstetrics and Gynecology

## 2023-11-11 DIAGNOSIS — Z01818 Encounter for other preprocedural examination: Secondary | ICD-10-CM

## 2023-11-11 DIAGNOSIS — O9089 Other complications of the puerperium, not elsewhere classified: Secondary | ICD-10-CM | POA: Diagnosis not present

## 2023-11-11 DIAGNOSIS — Z302 Encounter for sterilization: Secondary | ICD-10-CM | POA: Diagnosis present

## 2023-11-11 HISTORY — PX: LAPAROSCOPIC TUBAL LIGATION: SHX1937

## 2023-11-11 LAB — POCT PREGNANCY, URINE: Preg Test, Ur: NEGATIVE

## 2023-11-11 SURGERY — LIGATION, FALLOPIAN TUBE, LAPAROSCOPIC
Anesthesia: General | Laterality: Bilateral

## 2023-11-11 MED ORDER — MIDAZOLAM HCL 2 MG/2ML IJ SOLN
INTRAMUSCULAR | Status: AC
Start: 2023-11-11 — End: ?
  Filled 2023-11-11: qty 2

## 2023-11-11 MED ORDER — ORAL CARE MOUTH RINSE: 15.0000 mL | Freq: Once | OROMUCOSAL | Status: AC

## 2023-11-11 MED ORDER — ACETAMINOPHEN 500 MG PO TABS
1000.0000 mg | ORAL_TABLET | ORAL | Status: AC
  Administered 2023-11-11: 1000 mg via ORAL

## 2023-11-11 MED ORDER — LACTATED RINGERS IV SOLN: INTRAVENOUS | Status: DC

## 2023-11-11 MED ORDER — DEXAMETHASONE SODIUM PHOSPHATE 10 MG/ML IJ SOLN
INTRAMUSCULAR | Status: DC | PRN
  Administered 2023-11-11: 8 mg via INTRAVENOUS

## 2023-11-11 MED ORDER — ONDANSETRON HCL 4 MG/2ML IJ SOLN
INTRAMUSCULAR | Status: AC
  Filled 2023-11-11: qty 2

## 2023-11-11 MED ORDER — PROPOFOL 10 MG/ML IV BOLUS
INTRAVENOUS | Status: AC
  Filled 2023-11-11: qty 20

## 2023-11-11 MED ORDER — ONDANSETRON HCL 4 MG/2ML IJ SOLN
INTRAMUSCULAR | Status: DC | PRN
  Administered 2023-11-11: 4 mg via INTRAVENOUS

## 2023-11-11 MED ORDER — DEXMEDETOMIDINE HCL IN NACL 80 MCG/20ML IV SOLN
INTRAVENOUS | Status: DC | PRN
  Administered 2023-11-11: 8 ug via INTRAVENOUS
  Administered 2023-11-11: 12 ug via INTRAVENOUS

## 2023-11-11 MED ORDER — CHLORHEXIDINE GLUCONATE 0.12 % MT SOLN
15.0000 mL | Freq: Once | OROMUCOSAL | Status: AC
  Administered 2023-11-11: 15 mL via OROMUCOSAL

## 2023-11-11 MED ORDER — GABAPENTIN 300 MG PO CAPS
ORAL_CAPSULE | ORAL | Status: AC
  Filled 2023-11-11: qty 1

## 2023-11-11 MED ORDER — OXYCODONE HCL 5 MG/5ML PO SOLN: 5.0000 mg | Freq: Once | ORAL | Status: DC | PRN

## 2023-11-11 MED ORDER — SODIUM CHLORIDE 0.9% FLUSH: 10.0000 mL | Freq: Two times a day (BID) | INTRAVENOUS | Status: DC

## 2023-11-11 MED ORDER — SUGAMMADEX SODIUM 200 MG/2ML IV SOLN
INTRAVENOUS | Status: DC | PRN
  Administered 2023-11-11: 200 mg via INTRAVENOUS

## 2023-11-11 MED ORDER — MIDAZOLAM HCL 2 MG/2ML IJ SOLN
INTRAMUSCULAR | Status: DC | PRN
  Administered 2023-11-11: 2 mg via INTRAVENOUS

## 2023-11-11 MED ORDER — OXYCODONE HCL 5 MG PO TABS: 5.0000 mg | ORAL_TABLET | Freq: Once | ORAL | Status: DC | PRN

## 2023-11-11 MED ORDER — KETOROLAC TROMETHAMINE 30 MG/ML IJ SOLN
INTRAMUSCULAR | Status: DC | PRN
  Administered 2023-11-11: 30 mg via INTRAVENOUS

## 2023-11-11 MED ORDER — FENTANYL CITRATE (PF) 100 MCG/2ML IJ SOLN
INTRAMUSCULAR | Status: DC | PRN
  Administered 2023-11-11 (×2): 50 ug via INTRAVENOUS

## 2023-11-11 MED ORDER — PROPOFOL 10 MG/ML IV BOLUS
INTRAVENOUS | Status: DC | PRN
  Administered 2023-11-11: 200 mg via INTRAVENOUS

## 2023-11-11 MED ORDER — POVIDONE-IODINE 10 % EX SWAB
2.0000 | Freq: Once | CUTANEOUS | Status: AC
Start: 2023-11-11 — End: 2023-11-11
  Administered 2023-11-11: 2 via TOPICAL

## 2023-11-11 MED ORDER — FENTANYL CITRATE (PF) 100 MCG/2ML IJ SOLN
INTRAMUSCULAR | Status: AC
  Filled 2023-11-11: qty 2

## 2023-11-11 MED ORDER — CHLORHEXIDINE GLUCONATE 0.12 % MT SOLN
OROMUCOSAL | Status: AC
  Filled 2023-11-11: qty 15

## 2023-11-11 MED ORDER — LIDOCAINE HCL (PF) 2 % IJ SOLN
INTRAMUSCULAR | Status: AC
  Filled 2023-11-11: qty 35

## 2023-11-11 MED ORDER — GABAPENTIN 300 MG PO CAPS
300.0000 mg | ORAL_CAPSULE | ORAL | Status: AC
  Administered 2023-11-11: 300 mg via ORAL

## 2023-11-11 MED ORDER — BUPIVACAINE HCL 0.5 % IJ SOLN
INTRAMUSCULAR | Status: DC | PRN
  Administered 2023-11-11: 8 mL

## 2023-11-11 MED ORDER — PHENYLEPHRINE 80 MCG/ML (10ML) SYRINGE FOR IV PUSH (FOR BLOOD PRESSURE SUPPORT)
PREFILLED_SYRINGE | INTRAVENOUS | Status: AC
  Filled 2023-11-11: qty 20

## 2023-11-11 MED ORDER — ACETAMINOPHEN 500 MG PO TABS
ORAL_TABLET | ORAL | Status: AC
  Filled 2023-11-11: qty 2

## 2023-11-11 MED ORDER — FENTANYL CITRATE (PF) 100 MCG/2ML IJ SOLN: 25.0000 ug | INTRAMUSCULAR | Status: DC | PRN

## 2023-11-11 MED ORDER — BUPIVACAINE HCL (PF) 0.5 % IJ SOLN
INTRAMUSCULAR | Status: AC
  Filled 2023-11-11: qty 30

## 2023-11-11 MED ORDER — OXYCODONE-ACETAMINOPHEN 5-325 MG PO TABS: 1.0000 | ORAL_TABLET | ORAL | Status: DC | PRN

## 2023-11-11 MED ORDER — ONDANSETRON 4 MG PO TBDP: 4.0000 mg | ORAL_TABLET | Freq: Four times a day (QID) | ORAL | Status: DC | PRN

## 2023-11-11 MED ORDER — SILVER NITRATE-POT NITRATE 75-25 % EX MISC
CUTANEOUS | Status: DC | PRN
  Administered 2023-11-11: 2 via TOPICAL

## 2023-11-11 MED ORDER — PROPOFOL 1000 MG/100ML IV EMUL
INTRAVENOUS | Status: AC
  Filled 2023-11-11: qty 400

## 2023-11-11 MED ORDER — LIDOCAINE HCL (CARDIAC) PF 100 MG/5ML IV SOSY
PREFILLED_SYRINGE | INTRAVENOUS | Status: DC | PRN
  Administered 2023-11-11: 80 mg via INTRAVENOUS

## 2023-11-11 SURGICAL SUPPLY — 36 items
BLADE SURG SZ11 CARB STEEL (BLADE) ×1 IMPLANT
CATH ROBINSON RED A/P 16FR (CATHETERS) ×1 IMPLANT
CHLORAPREP W/TINT 26 (MISCELLANEOUS) ×1 IMPLANT
DRSG TEGADERM 2-3/8X2-3/4 SM (GAUZE/BANDAGES/DRESSINGS) ×2 IMPLANT
GAUZE 4X4 16PLY ~~LOC~~+RFID DBL (SPONGE) ×1 IMPLANT
GAUZE SPONGE 2X2 STRL 8-PLY (GAUZE/BANDAGES/DRESSINGS) ×1 IMPLANT
GLOVE SURG SYN 8.0 (GLOVE) ×2 IMPLANT
GLOVE SURG SYN 8.0 PF PI (GLOVE) ×1 IMPLANT
GOWN STRL REUS W/ TWL LRG LVL3 (GOWN DISPOSABLE) ×1 IMPLANT
GOWN STRL REUS W/ TWL XL LVL3 (GOWN DISPOSABLE) ×2 IMPLANT
GRASPER SUT TROCAR 14GX15 (MISCELLANEOUS) IMPLANT
KIT DISPOSABLE FALLOPE RING (Ring) ×1 IMPLANT
KIT PINK PAD W/HEAD ARE REST (MISCELLANEOUS) ×1
KIT PINK PAD W/HEAD ARM REST (MISCELLANEOUS) ×1 IMPLANT
KIT TURNOVER CYSTO (KITS) ×1 IMPLANT
LABEL OR SOLS (LABEL) ×1 IMPLANT
MANIFOLD NEPTUNE II (INSTRUMENTS) ×1 IMPLANT
NDL HYPO 22X1.5 SAFETY MO (MISCELLANEOUS) ×1 IMPLANT
NEEDLE HYPO 22X1.5 SAFETY MO (MISCELLANEOUS) ×1 IMPLANT
NS IRRIG 500ML POUR BTL (IV SOLUTION) ×1 IMPLANT
PACK GYN LAPAROSCOPIC (MISCELLANEOUS) ×1 IMPLANT
PAD OB MATERNITY 4.3X12.25 (PERSONAL CARE ITEMS) ×1 IMPLANT
PAD PREP OB/GYN DISP 24X41 (PERSONAL CARE ITEMS) ×1 IMPLANT
SCRUB CHG 4% DYNA-HEX 4OZ (MISCELLANEOUS) ×1 IMPLANT
SET TUBE SMOKE EVAC HIGH FLOW (TUBING) ×1 IMPLANT
SHEARS HARMONIC 36 ACE (MISCELLANEOUS) IMPLANT
SLEEVE Z-THREAD 5X100MM (TROCAR) IMPLANT
STRIP CLOSURE SKIN 1/4X4 (GAUZE/BANDAGES/DRESSINGS) ×1 IMPLANT
SUT VIC AB 0 CT1 36 (SUTURE) IMPLANT
SUT VIC AB 2-0 UR6 27 (SUTURE) IMPLANT
SUT VIC AB 4-0 SH 27XANBCTRL (SUTURE) ×1 IMPLANT
SWABSTK COMLB BENZOIN TINCTURE (MISCELLANEOUS) IMPLANT
TRAP FLUID SMOKE EVACUATOR (MISCELLANEOUS) ×1 IMPLANT
TROCAR Z-THRD FIOS HNDL 11X100 (TROCAR) IMPLANT
TROCAR Z-THREAD FIOS 5X100MM (TROCAR) ×1 IMPLANT
WATER STERILE IRR 500ML POUR (IV SOLUTION) ×1 IMPLANT

## 2023-11-11 NOTE — Anesthesia Postprocedure Evaluation (Signed)
Anesthesia Post Note  Patient: Gina Gomez  Procedure(s) Performed: LAPAROSCOPIC TUBAL LIGATION WITH FALOPE RINGS (Bilateral)  Patient location during evaluation: PACU Anesthesia Type: General Level of consciousness: awake and alert Pain management: pain level controlled Vital Signs Assessment: post-procedure vital signs reviewed and stable Respiratory status: spontaneous breathing, nonlabored ventilation, respiratory function stable and patient connected to nasal cannula oxygen Cardiovascular status: blood pressure returned to baseline and stable Postop Assessment: no apparent nausea or vomiting Anesthetic complications: no  No notable events documented.   Last Vitals:  Vitals:   11/11/23 0930 11/11/23 0940  BP: 129/83 (!) 133/92  Pulse: 60 (!) 56  Resp: 14 16  Temp: (!) 36.1 C (!) 36.3 C  SpO2: 97% 100%    Last Pain:  Vitals:   11/11/23 0940  TempSrc: Temporal  PainSc: 3                  Stephanie Coup

## 2023-11-11 NOTE — Anesthesia Preprocedure Evaluation (Signed)
Anesthesia Evaluation  Patient identified by MRN, date of birth, ID band Patient awake    Reviewed: Allergy & Precautions, NPO status , Patient's Chart, lab work & pertinent test results  Airway Mallampati: III  TM Distance: >3 FB Neck ROM: full    Dental  (+) Chipped, Dental Advidsory Given   Pulmonary neg pulmonary ROS   Pulmonary exam normal        Cardiovascular negative cardio ROS Normal cardiovascular exam     Neuro/Psych  PSYCHIATRIC DISORDERS Anxiety     negative neurological ROS     GI/Hepatic negative GI ROS, Neg liver ROS,,,  Endo/Other  diabetes  Class 3 obesity  Renal/GU      Musculoskeletal   Abdominal   Peds  Hematology negative hematology ROS (+)   Anesthesia Other Findings Past Medical History: No date: Asthma     Comment:  child No date: Gestational diabetes No date: Headache     Comment:  migraine No date: Obesity (BMI 35.0-39.9 without comorbidity)  Past Surgical History: No date: NO PAST SURGERIES  BMI    Body Mass Index: 41.94 kg/m      Reproductive/Obstetrics negative OB ROS                             Anesthesia Physical Anesthesia Plan  ASA: 3  Anesthesia Plan: General ETT and General   Post-op Pain Management:    Induction: Intravenous  PONV Risk Score and Plan: 3 and Ondansetron, Dexamethasone and Midazolam  Airway Management Planned: Oral ETT  Additional Equipment:   Intra-op Plan:   Post-operative Plan: Extubation in OR  Informed Consent: I have reviewed the patients History and Physical, chart, labs and discussed the procedure including the risks, benefits and alternatives for the proposed anesthesia with the patient or authorized representative who has indicated his/her understanding and acceptance.     Dental Advisory Given  Plan Discussed with: Anesthesiologist, CRNA and Surgeon  Anesthesia Plan Comments: (Patient consented  for risks of anesthesia including but not limited to:  - adverse reactions to medications - damage to eyes, teeth, lips or other oral mucosa - nerve damage due to positioning  - sore throat or hoarseness - Damage to heart, brain, nerves, lungs, other parts of body or loss of life  Patient voiced understanding and assent.)       Anesthesia Quick Evaluation

## 2023-11-11 NOTE — Progress Notes (Signed)
Pt here for L/S BTL / cautery . Neg HCG  Labs reviewed . All questions answered . Proceed

## 2023-11-11 NOTE — Anesthesia Procedure Notes (Signed)
Procedure Name: Intubation Date/Time: 11/11/2023 7:48 AM  Performed by: Jaye Beagle, CRNAPre-anesthesia Checklist: Patient identified, Emergency Drugs available, Suction available and Patient being monitored Patient Re-evaluated:Patient Re-evaluated prior to induction Oxygen Delivery Method: Circle system utilized Preoxygenation: Pre-oxygenation with 100% oxygen Induction Type: IV induction Ventilation: Mask ventilation without difficulty Laryngoscope Size: McGrath and 3 Grade View: Grade I Tube type: Oral Tube size: 7.0 mm Number of attempts: 1 Airway Equipment and Method: Stylet and Oral airway Placement Confirmation: ETT inserted through vocal cords under direct vision, positive ETCO2 and breath sounds checked- equal and bilateral Tube secured with: Tape Dental Injury: Teeth and Oropharynx as per pre-operative assessment

## 2023-11-11 NOTE — Op Note (Signed)
NAME: Gina Gomez, Gina Gomez MEDICAL RECORD NO: [id number] ACCOUNT NO: [id number] DATE OF BIRTH: 1996-09-02 FACILITY: [facility] LOCATION: [facility] PHYSICIAN: Suzy Bouchard, MD  Operative Report   DATE OF PROCEDURE: 11/11/2023  PREOPERATIVE DIAGNOSIS:  Elective permanent sterilization.  POSTOPERATIVE DIAGNOSIS:  Elective permanent sterilization.  PROCEDURE:  Laparoscopic bilateral tubal ligation, Falope rings.  SURGEON:  Suzy Bouchard, MD  ANESTHESIA:  General endotracheal anesthesia.  INDICATIONS:  This is a 27 year old gravida 3, para 3, patient decided on elective permanent sterilization.  The patient is aware of the failure rate from sterilization of 1 per 300 per year.  DESCRIPTION OF PROCEDURE:  After adequate general endotracheal anesthesia, the patient was placed in the dorsal supine position with the legs in the Bratenahl stirrups.  The patient's abdomen and vagina were prepped and draped in normal sterile fashion.   Timeout was performed.  A speculum was placed into the vagina and the anterior cervix was grasped with a single-tooth tenaculum and the Cohen cannula was placed in the endocervical canal to be used for uterine manipulation during the procedure.  The  patient's bladder was drained yielding 200 mL clear urine.  Gloves and gown were changed.  Attention was directed to the patient's abdomen where a 5 mm infraumbilical incision was made after injecting with 0.5% Marcaine.  The 5 mm laparoscope was then  advanced into the abdominal cavity under direct visualization with the Optiview cannula.  The patient's abdomen was insufflated.  The patient was placed in slight Trendelenburg.  Uterus was identified with normal-appearing ovaries.  Second port placement  was placed 2 cm above the symphysis pubis in the central portion and the Falope ring trocar was then inserted after making a small incision and injecting with Marcaine.  Trocar was advanced under direct visualization.   Falope ring was then applied to  the Falope ring applier and the attention was directed to the patient's right fallopian tube, which was grasped with the isthmic ampullary portion of the fallopian tube and the Falope ring was applied yielding a 1.5 cm knuckle of fallopian tube.  Good  hemostasis noted.  Similar procedure was repeated on the patient's left fallopian tube after visualizing the fimbriated end.  The Falope ring was applied to the isthmic capillary portion of the fallopian tube and a 1.5 cm portion of the fallopian tube  was resulted in the knuckle of the tube.  The upper abdomen appeared normal.  Good hemostasis noted.  The patient was taken out of Trendelenburg and the patient's abdomen was deflated and both incisions were closed with interrupted 4-0 Vicryl suture.   Good hemostasis was noted.  The single-tooth tenaculum and the Cohen cannula were removed from the vagina.  Silver nitrate applied to the tenaculum sites.  Good hemostasis noted.  There were no complications.  ESTIMATED BLOOD LOSS:  Minimal.  INTRAOPERATIVE FLUIDS:  500 mL.  URINE OUTPUT:  200 mL.  DISPOSITION:  The patient did receive 30 mg of intravenous Toradol at the end of the procedure and was taken to the recovery room in good condition.    VAI D: 11/11/2023 8:49:31 am T: 11/11/2023 9:00:00 am  JOB: [id number]/ [id number]

## 2023-11-11 NOTE — Brief Op Note (Signed)
11/11/2023  8:37 AM  PATIENT:  Gina Gomez  27 y.o. female  PRE-OPERATIVE DIAGNOSIS:  Elective sterilization  POST-OPERATIVE DIAGNOSIS:  Elective sterilization  PROCEDURE:  Procedure(s): LAPAROSCOPIC TUBAL LIGATION WITH FALOPE RINGS (Bilateral)  SURGEON:  Surgeons and Role:    * Conn Trombetta, Ihor Austin, MD - Primary  PHYSICIAN ASSISTANT:   ASSISTANTS: cst   ANESTHESIA:   general  EBL:  ebl : 5 cc, IOF 500 cc uo 200cc   BLOOD ADMINISTERED:none  DRAINS: none   LOCAL MEDICATIONS USED:  MARCAINE     SPECIMEN:  No Specimen  DISPOSITION OF SPECIMEN:  N/A  COUNTS:  YES  TOURNIQUET:  * No tourniquets in log *  DICTATION: .Other Dictation: Dictation Number verbal   PLAN OF CARE: Discharge to home after PACU  PATIENT DISPOSITION:  PACU - hemodynamically stable.   Delay start of Pharmacological VTE agent (>24hrs) due to surgical blood loss or risk of bleeding: not applicable

## 2023-11-11 NOTE — Transfer of Care (Signed)
Immediate Anesthesia Transfer of Care Note  Patient: Gina Gomez  Procedure(s) Performed: LAPAROSCOPIC TUBAL LIGATION WITH FALOPE RINGS (Bilateral)  Patient Location: PACU  Anesthesia Type:General  Level of Consciousness: drowsy  Airway & Oxygen Therapy: Patient Spontanous Breathing and Patient connected to face mask oxygen  Post-op Assessment: Report given to RN  Post vital signs: stable  Last Vitals:  Vitals Value Taken Time  BP    Temp    Pulse 69 11/11/23 0846  Resp 19 11/11/23 0846  SpO2 100 % 11/11/23 0846  Vitals shown include unfiled device data.  Last Pain:  Vitals:   11/11/23 0623  TempSrc: Temporal  PainSc: 0-No pain         Complications: No notable events documented.

## 2023-11-12 ENCOUNTER — Encounter: Payer: Self-pay | Admitting: Obstetrics and Gynecology

## 2023-11-30 ENCOUNTER — Encounter: Payer: Self-pay | Admitting: Obstetrics and Gynecology

## 2024-04-20 ENCOUNTER — Telehealth: Admitting: Nurse Practitioner

## 2024-04-20 ENCOUNTER — Encounter: Admitting: Nurse Practitioner

## 2024-04-20 DIAGNOSIS — B9689 Other specified bacterial agents as the cause of diseases classified elsewhere: Secondary | ICD-10-CM

## 2024-04-20 DIAGNOSIS — L089 Local infection of the skin and subcutaneous tissue, unspecified: Secondary | ICD-10-CM | POA: Diagnosis not present

## 2024-04-20 MED ORDER — MUPIROCIN 2 % EX OINT
1.0000 | TOPICAL_OINTMENT | Freq: Two times a day (BID) | CUTANEOUS | 1 refills | Status: AC
Start: 2024-04-20 — End: ?

## 2024-04-20 NOTE — Progress Notes (Signed)
Duplicate evisit 

## 2024-04-20 NOTE — Progress Notes (Signed)
 At this time based on the pictures submitted we will treat with Bactroban ointment for possible MRSA   E-Visit for MRSA  We are sorry that you are not feeling well. Here is how we plan to help!  I have prescribed:  Bactroban to be applied twice a day for 10 days  HOME CARE:  Take your medications as ordered and take all of them, even if the skin irritation appears to be healing.   GET HELP RIGHT AWAY IF:  Symptoms that don't begin to go away within 48 hours. Severe redness persists or worsens If the area turns color, spreads or swells. If it blisters and opens, develops yellow-brown crust or bleeds. You develop a fever or chills. If the pain increases or becomes unbearable.  Are unable to keep fluids and food down.  MAKE SURE YOU   Understand these instructions. Will watch your condition. Will get help right away if you are not doing well or get worse.  Thank you for choosing an e-visit.  Your e-visit answers were reviewed by a board certified advanced clinical practitioner to complete your personal care plan. Depending upon the condition, your plan could have included both over the counter or prescription medications.  Please review your pharmacy choice. Make sure the pharmacy is open so you can pick up prescription now. If there is a problem, you may contact your provider through [employer] and have the prescription routed to another pharmacy.  Your safety is important to us . If you have drug allergies check your prescription carefully.   For the next 24 hours you can use MyChart to ask questions about today's visit, request a non-urgent call back, or ask for a work or school excuse. You will get an email in the next two days asking about your experience. I hope that your e-visit has been valuable and will speed your recovery.

## 2024-04-20 NOTE — Progress Notes (Signed)
 I have spent 5 minutes in review of e-visit questionnaire, review and updating patient chart, medical decision making and response to patient.   Claiborne Rigg, NP

## 2024-04-20 NOTE — Progress Notes (Signed)
 Gina Gomez had an evisit earlier today for what she believed to be a possible MRSA infection. At that time based on the pictures provided I prescribed topical antibiotics and instructed her if no improvement to follow up with PCP as she has an appt in 10 days.    She scheduled a video visit stating that she had been using mupirocin from an old prescription she had been given from over a year ago and it was not working however there is no prescription on file for mupirocin in her chart.  She then stated she had been using her family member's mupirocin.  I again instructed her to try the new prescription of mupirocin as I do not know how old the other mupirocin is that she is using and again that I did not feel p.o. antibiotics were appropriate as there is only a single lesion.  She states she will go to urgent care at this time.

## 2024-07-20 ENCOUNTER — Ambulatory Visit: Payer: Self-pay

## 2024-07-21 ENCOUNTER — Emergency Department (HOSPITAL_COMMUNITY)

## 2024-07-21 ENCOUNTER — Emergency Department (HOSPITAL_COMMUNITY)
Admission: EM | Admit: 2024-07-21 | Discharge: 2024-07-21 | Disposition: A | Discharge status: Home / Self Care | Attending: Emergency Medicine | Admitting: Emergency Medicine

## 2024-07-21 ENCOUNTER — Other Ambulatory Visit: Payer: Self-pay

## 2024-07-21 ENCOUNTER — Encounter (HOSPITAL_COMMUNITY): Payer: Self-pay

## 2024-07-21 ENCOUNTER — Ambulatory Visit
Admission: RE | Admit: 2024-07-21 | Discharge: 2024-07-21 | Disposition: A | Discharge status: Home / Self Care | Payer: Self-pay | Source: Ambulatory Visit | Attending: Family Medicine | Admitting: Family Medicine

## 2024-07-21 VITALS — BP 121/82 | HR 98 | Temp 98.1°F | Resp 16 | Ht 62.0 in | Wt 229.3 lb

## 2024-07-21 DIAGNOSIS — N3001 Acute cystitis with hematuria: Secondary | ICD-10-CM | POA: Diagnosis not present

## 2024-07-21 DIAGNOSIS — K6389 Other specified diseases of intestine: Secondary | ICD-10-CM | POA: Diagnosis not present

## 2024-07-21 DIAGNOSIS — R1032 Left lower quadrant pain: Secondary | ICD-10-CM

## 2024-07-21 LAB — URINALYSIS, W/ REFLEX TO CULTURE (INFECTION SUSPECTED)
Bilirubin Urine: NEGATIVE
Glucose, UA: NEGATIVE mg/dL
Ketones, ur: NEGATIVE mg/dL
Nitrite: NEGATIVE
Specific Gravity, Urine: 1.025 (ref 1.005–1.030)
WBC, UA: >50 WBC/hpf (ref 0–5)
pH: 5.5 (ref 5.0–8.0)

## 2024-07-21 LAB — CBC WITH DIFFERENTIAL/PLATELET
Abs Immature Granulocytes: 0.11 K/uL — ABNORMAL HIGH (ref 0.00–0.07)
Basophils Absolute: 0.1 K/uL (ref 0.0–0.1)
Basophils Relative: 0 %
Eosinophils Absolute: 0.2 K/uL (ref 0.0–0.5)
Eosinophils Relative: 1 %
HCT: 41.9 % (ref 36.0–46.0)
Hemoglobin: 13.3 g/dL (ref 12.0–15.0)
Immature Granulocytes: 1 %
Lymphocytes Relative: 26 %
Lymphs Abs: 3.1 K/uL (ref 0.7–4.0)
MCH: 26.2 pg (ref 26.0–34.0)
MCHC: 31.7 g/dL (ref 30.0–36.0)
MCV: 82.6 fL (ref 80.0–100.0)
Monocytes Absolute: 0.6 K/uL (ref 0.1–1.0)
Monocytes Relative: 5 %
Neutro Abs: 8 K/uL — ABNORMAL HIGH (ref 1.7–7.7)
Neutrophils Relative %: 67 %
Platelets: 310 K/uL (ref 150–400)
RBC: 5.07 MIL/uL (ref 3.87–5.11)
RDW: 13.7 % (ref 11.5–15.5)
WBC: 12.1 K/uL — ABNORMAL HIGH (ref 4.0–10.5)
nRBC: 0 % (ref 0.0–0.2)

## 2024-07-21 LAB — COMPREHENSIVE METABOLIC PANEL WITH GFR
ALT: 17 U/L (ref 0–44)
AST: 15 U/L (ref 15–41)
Albumin: 4.1 g/dL (ref 3.5–5.0)
Alkaline Phosphatase: 111 U/L (ref 38–126)
Anion gap: 11 (ref 5–15)
BUN: 11 mg/dL (ref 6–20)
CO2: 24 mmol/L (ref 22–32)
Calcium: 9.2 mg/dL (ref 8.9–10.3)
Chloride: 100 mmol/L (ref 98–111)
Creatinine, Ser: 0.63 mg/dL (ref 0.44–1.00)
GFR, Estimated: >60 mL/min (ref >60)
Glucose, Bld: 92 mg/dL (ref 70–99)
Potassium: 3.9 mmol/L (ref 3.5–5.1)
Sodium: 135 mmol/L (ref 135–145)
Total Bilirubin: 0.3 mg/dL (ref 0.0–1.2)
Total Protein: 8.3 g/dL — ABNORMAL HIGH (ref 6.5–8.1)

## 2024-07-21 LAB — URINALYSIS, ROUTINE W REFLEX MICROSCOPIC
Bacteria, UA: NONE SEEN
Bilirubin Urine: NEGATIVE
Glucose, UA: NEGATIVE mg/dL
Ketones, ur: NEGATIVE mg/dL
Nitrite: NEGATIVE
Protein, ur: NEGATIVE mg/dL
Specific Gravity, Urine: 1.009 (ref 1.005–1.030)
pH: 6 (ref 5.0–8.0)

## 2024-07-21 LAB — POC URINE PREG, ED: Preg Test, Ur: NEGATIVE

## 2024-07-21 LAB — PREGNANCY, URINE: Preg Test, Ur: NEGATIVE

## 2024-07-21 LAB — LIPASE, BLOOD: Lipase: 26 U/L (ref 11–51)

## 2024-07-21 MED ORDER — SODIUM CHLORIDE 0.9 % IV BOLUS
1000.0000 mL | Freq: Once | INTRAVENOUS | Status: AC
  Administered 2024-07-21: 1000 mL via INTRAVENOUS

## 2024-07-21 MED ORDER — KETOROLAC TROMETHAMINE 15 MG/ML IJ SOLN
15.0000 mg | Freq: Once | INTRAMUSCULAR | Status: AC
Start: 2024-07-21 — End: 2024-07-21
  Administered 2024-07-21: 15 mg via INTRAVENOUS
  Filled 2024-07-21: qty 1

## 2024-07-21 MED ORDER — NITROFURANTOIN MONOHYD MACRO 100 MG PO CAPS: 100.0000 mg | ORAL_CAPSULE | Freq: Two times a day (BID) | ORAL | 0 refills | Status: AC

## 2024-07-21 MED ORDER — NAPROXEN 500 MG PO TABS: 500.0000 mg | ORAL_TABLET | Freq: Two times a day (BID) | ORAL | 0 refills | Status: AC

## 2024-07-21 MED ORDER — IOHEXOL 300 MG/ML  SOLN
100.0000 mL | Freq: Once | INTRAMUSCULAR | Status: AC | PRN
  Administered 2024-07-21: 100 mL via INTRAVENOUS

## 2024-07-21 MED ORDER — OXYCODONE HCL 5 MG PO CAPS: 5.0000 mg | ORAL_CAPSULE | Freq: Four times a day (QID) | ORAL | 0 refills | Status: AC | PRN

## 2024-07-21 NOTE — ED Triage Notes (Signed)
 Pt c/o LLQ abdominal pain. Started about 4 days ago. She states she had nausea. Denies vomiting, diarrhea. Denies urinary symptoms. She states moving around makes the pain worse.

## 2024-07-21 NOTE — ED Provider Notes (Signed)
 [facility]    CSN: [id number] Arrival date & time: 07/21/24  1259      History   Chief Complaint Chief Complaint  Patient presents with   Abdominal Pain    HPI Gina Gomez is a 28 y.o. female.   HPI  Gina Gomez presents for constant throbbing and dull LLQ abdominal pain that started 4 days ago.  This morning, she started sweating and was very nauseous.  Moving makes the pain worse. Rolling over makes the pain worse.  Patient's last menstrual period was 07/16/2024 (exact date). She is currently menstruating.  She got a tubal ligation in Dec 2024.    Past Surgeries: no previous abdominal surgeries   Symptoms Nausea/Vomiting: yes, nausea   Diarrhea: no  Constipation: no  Melena/BRBPR: no  Hematemesis: no  Anorexia: no  Fever/Chills: no  Dysuria: no  Rash: no  Wt loss: no  EtOH use: no  NSAIDs/ASA: yes  Vaginal bleeding: yes  STD risk/hx: no  Sore throat: no   Cough: no Nasal congestion : no  Sleep disturbance: yes  Back Pain: no Headache: yes   Past Medical History:  Diagnosis Date   Asthma    child   Gestational diabetes    Headache    migraine   Obesity (BMI 35.0-39.9 without comorbidity)     Patient Active Problem List   Diagnosis Date Noted   Obesity affecting pregnancy 10/01/2023   Rubella nonimmune status, delivered, current hospitalization 10/01/2023   NSVD (normal spontaneous vaginal delivery) 10/01/2023   Precipitous delivery 10/01/2023   Chronic hypertension affecting pregnancy 09/30/2023   Fetal growth restriction antepartum 09/30/2023   Supervision of high risk pregnancy in third trimester 03/25/2023   Hypertriglyceridemia 04/06/2022   Anxiety 03/03/2022   Cataract 03/03/2022   History of retinal detachment 03/03/2022   Insulin  controlled gestational diabetes mellitus (GDM) in third trimester 10/15/2021   Encounter for induction of labor     Past Surgical History:  Procedure Laterality Date   LAPAROSCOPIC TUBAL LIGATION  Bilateral 11/11/2023   Procedure: LAPAROSCOPIC TUBAL LIGATION WITH FALOPE RINGS;  Surgeon: Schermerhorn, Debby PARAS, MD;  Location: [facility] ORS;  Service: Gynecology;  Laterality: Bilateral;   NO PAST SURGERIES      OB History     Gravida  3   Para  3   Term  3   Preterm      AB      Living  3      SAB      IAB      Ectopic      Multiple  0   Live Births  2            Home Medications    Prior to Admission medications   Medication Sig Start Date End Date Taking? Authorizing Provider  nitrofurantoin , macrocrystal-monohydrate, (MACROBID ) 100 MG capsule Take 1 capsule (100 mg total) by mouth 2 (two) times daily. 07/21/24  Yes Luticia Tadros, DO  mupirocin  ointment (BACTROBAN ) 2 % Apply 1 Application topically 2 (two) times daily. 04/20/24   Theotis Haze ORN, NP    Family History Family History  Problem Relation Age of Onset   Hypertension Maternal Grandfather    Diabetes Maternal Grandfather    Heart disease Maternal Grandfather    Cancer Maternal Grandmother    Ovarian cysts Mother    Heart disease Paternal Grandmother    High Cholesterol Paternal Grandmother     Social History Social History   Tobacco Use   Smoking  status: Never   Smokeless tobacco: Never  Vaping Use   Vaping status: Never Used  Substance Use Topics   Alcohol use: No    Alcohol/week: 0.0 standard drinks of alcohol   Drug use: No     Allergies   Patient has no known allergies.   Review of Systems Review of Systems :negative unless otherwise stated in HPI.      Physical Exam Triage Vital Signs ED Triage Vitals  Encounter Vitals Group     BP 07/21/24 1319 121/82     Girls Systolic BP Percentile --      Girls Diastolic BP Percentile --      Boys Systolic BP Percentile --      Boys Diastolic BP Percentile --      Pulse Rate 07/21/24 1319 98     Resp 07/21/24 1319 16     Temp 07/21/24 1319 98.1 F (36.7 C)     Temp Source 07/21/24 1319 Oral     SpO2 07/21/24 1319 98 %      Weight 07/21/24 1318 229 lb 4.5 oz (104 kg)     Height 07/21/24 1318 5' 2 (1.575 m)     Head Circumference --      Peak Flow --      Pain Score 07/21/24 1317 6     Pain Loc --      Pain Education --      Exclude from Growth Chart --    No data found.  Updated Vital Signs BP 121/82 (BP Location: Right Arm)   Pulse 98   Temp 98.1 F (36.7 C) (Oral)   Resp 16   Ht 5' 2 (1.575 m)   Wt 104 kg   LMP 07/16/2024 (Exact Date)   SpO2 98%   Breastfeeding No   BMI 41.94 kg/m   Visual Acuity Right Eye Distance:   Left Eye Distance:   Bilateral Distance:    Right Eye Near:   Left Eye Near:    Bilateral Near:     Physical Exam  GEN: pleasant well appearing female, in no acute distress  CV: regular rate  RESP: no increased work of breathing ABD: Bowel sounds present. Soft, generalized TTP though worse at the LLQ, non-distended. No guarding,no rebound,no appreciable hepatosplenomegaly, negative McBurney's, negative Murphy SKIN: warm, dry NEURO: alert, moves all extremities appropriately PSYCH: Normal affect, appropriate speech and behavior   UC Treatments / Results  Labs (all labs ordered are listed, but only abnormal results are displayed) Labs Reviewed  URINALYSIS, W/ REFLEX TO CULTURE (INFECTION SUSPECTED) - Abnormal; Notable for the following components:      Result Value   APPearance HAZY (*)    Hgb urine dipstick LARGE (*)    Protein, ur TRACE (*)    Leukocytes,Ua MODERATE (*)    Bacteria, UA MANY (*)    All other components within normal limits  URINE CULTURE    EKG  If EKG performed, see my interpretation and MDM section  Radiology No results found.   Procedures Procedures (including critical care time)  Medications Ordered in UC Medications - No data to display  Initial Impression / Assessment and Plan / UC Course  I have reviewed the triage vital signs and the nursing notes.  Pertinent labs & imaging results that were available during my  care of the patient were reviewed by me and considered in my medical decision making (see chart for details).       Patient is a  28 y.o. female who presents after having insidious LLQ abdominal pain about 4 days ago.  Overall, patient is well-appearing, well-hydrated, and in no acute distress.  Vital signs stable.  Gina Gomez afebrile.  Exam is not concerning for an acute abdomen.  Obtained Urinalysis. She had a tubal ligation in the past year.   No personal history of kidney stones.    DDX includes but not limited to: UTI, STI, cholecystitis, pancreatitis, gastroenteritis, nephrolithiasis, constipation, appendicitis, ovarian cyst    Urinalysis concerning for acute cystitis.  She does have some hematuria on microscopy but she is also menstruating.  Discussed the possibility of 2 things happening in her abdomen.  If symptoms are worsening, she will go to the emergency department for CT imaging.  She does have a follow-up visit with her OB on Monday.   Follow-up, return and ED precautions given. Discussed MDM, treatment plan and plan for follow-up with patient who agrees with plan.    Final Clinical Impressions(s) / UC Diagnoses   Final diagnoses:  Left lower quadrant abdominal pain  Acute cystitis with hematuria     Discharge Instructions       You have a urinary tract infection. I sent your urine for culture to be sure the antibiotic prescribed will treat your infection. Someone may call you to change antibiotics. Stop by the pharmacy to pick up your prescriptions.  Follow up with your primary care provider or return to the urgent care, if not improving.         ED Prescriptions     Medication Sig Dispense Auth. Provider   nitrofurantoin , macrocrystal-monohydrate, (MACROBID ) 100 MG capsule Take 1 capsule (100 mg total) by mouth 2 (two) times daily. 10 capsule Jazlen Ogarro, DO      PDMP not reviewed this encounter.   Kelsey Edman, DO 07/21/24 1350

## 2024-07-21 NOTE — ED Provider Notes (Signed)
 [place] EMERGENCY DEPARTMENT AT [facility] Provider Note   CSN: [id number] Arrival date & time: 07/21/24  1815     Patient presents with: Abdominal Pain   Gina Gomez is a 28 y.o. female.  Patient with past medical history of obesity, hyperlipidemia presented to emergency room with complaint of left lower abdominal pain, nausea.  This started 4 days ago and has gradually worsened.  She reports that she started her menstrual period 07/16/2024 as expected.  She denies any vomiting or change in bowel movements.  She denies any dysuria.  Denies back pain.  No fever or chills.  No history of prior abdominal surgeries.  She was evaluated by urgent care earlier today and recommended to come to ER for CT scan.    Abdominal Pain      Prior to Admission medications   Medication Sig Start Date End Date Taking? Authorizing Provider  mupirocin  ointment (BACTROBAN ) 2 % Apply 1 Application topically 2 (two) times daily. 04/20/24   Theotis Haze ORN, NP  nitrofurantoin , macrocrystal-monohydrate, (MACROBID ) 100 MG capsule Take 1 capsule (100 mg total) by mouth 2 (two) times daily. 07/21/24   Brimage, Vondra, DO    Allergies: Patient has no known allergies.    Review of Systems  Gastrointestinal:  Positive for abdominal pain.    Updated Vital Signs BP (!) 148/105 (BP Location: Right Arm)   Pulse (!) 101   Temp 98.4 F (36.9 C) (Oral)   Resp 17   Ht 5' 2 (1.575 m)   Wt 103.9 kg   LMP 07/16/2024 (Exact Date)   SpO2 100%   BMI 41.88 kg/m   Physical Exam Vitals and nursing note reviewed.  Constitutional:      General: She is not in acute distress.    Appearance: She is not toxic-appearing.  HENT:     Head: Normocephalic and atraumatic.  Eyes:     General: No scleral icterus.    Conjunctiva/sclera: Conjunctivae normal.  Cardiovascular:     Rate and Rhythm: Normal rate and regular rhythm.     Pulses: Normal pulses.     Heart sounds: Normal heart sounds.  Pulmonary:      Effort: Pulmonary effort is normal. No respiratory distress.     Breath sounds: Normal breath sounds.  Abdominal:     General: Abdomen is flat. Bowel sounds are normal.     Palpations: Abdomen is soft.     Tenderness: There is abdominal tenderness in the left lower quadrant.     Comments: Left lower quadrant abdominal tenderness to palpation.  Skin:    General: Skin is warm and dry.     Findings: No lesion.  Neurological:     General: No focal deficit present.     Mental Status: She is alert and oriented to person, place, and time. Mental status is at baseline.     (all labs ordered are listed, but only abnormal results are displayed) Labs Reviewed  URINALYSIS, ROUTINE W REFLEX MICROSCOPIC - Abnormal; Notable for the following components:      Result Value   Hgb urine dipstick MODERATE (*)    Leukocytes,Ua LARGE (*)    All other components within normal limits  PREGNANCY, URINE  COMPREHENSIVE METABOLIC PANEL WITH GFR  LIPASE, BLOOD  CBC WITH DIFFERENTIAL/PLATELET  POC URINE PREG, ED    EKG: None  Radiology: CT ABDOMEN PELVIS W CONTRAST Result Date: 07/21/2024 CLINICAL DATA:  LLQ abdominal pain EXAM: CT ABDOMEN AND PELVIS WITH CONTRAST TECHNIQUE:  Multidetector CT imaging of the abdomen and pelvis was performed using the standard protocol following bolus administration of intravenous contrast. RADIATION DOSE REDUCTION: This exam was performed according to the departmental dose-optimization program which includes automated exposure control, adjustment of the mA and/or kV according to patient size and/or use of iterative reconstruction technique. CONTRAST:  OMNIPAQUE  IOHEXOL  300 MG/ML  SOLN COMPARISON:  None Available. FINDINGS: Lower chest: No acute abnormality. Hepatobiliary: The liver is enlarged measuring up to 19 cm. No focal liver abnormality. No gallstones, gallbladder wall thickening, or pericholecystic fluid. No biliary dilatation. Pancreas: No focal lesion. Normal  pancreatic contour. No surrounding inflammatory changes. No main pancreatic ductal dilatation. Spleen: Normal in size without focal abnormality. Adrenals/Urinary Tract: No adrenal nodule bilaterally. Bilateral kidneys enhance symmetrically. No hydronephrosis. No hydroureter. The urinary bladder is unremarkable. Stomach/Bowel: Stomach is within normal limits. No evidence of bowel wall thickening or dilatation. Appendix appears normal. Vascular/Lymphatic: No abdominal aorta or iliac aneurysm. No abdominal, pelvic, or inguinal lymphadenopathy. Reproductive: Uterus and bilateral adnexa are unremarkable. Other: 4 cm oval mesenteric fat along the distal descending/proximal sigmoid colon surrounded by a peripheral linear density and fat stranding consistent with epiploic appendagitis. No intraperitoneal free fluid. No intraperitoneal free gas. No organized fluid collection. Musculoskeletal: No abdominal wall hernia or abnormality. No suspicious lytic or blastic osseous lesions. No acute displaced fracture. IMPRESSION: Distal descending/proximal sigmoid epiploic appendagitis. Electronically Signed   By: Morgane  Naveau M.D.   On: 07/21/2024 20:55     Procedures   Medications Ordered in the ED  sodium chloride  0.9 % bolus 1,000 mL (1,000 mLs Intravenous New Bag/Given 07/21/24 1939)                                    Medical Decision Making Amount and/or Complexity of Data Reviewed Labs: ordered. Radiology: ordered.  Risk Prescription drug management.   This patient presents to the ED for concern of abdominal pain, this involves an extensive number of treatment options, and is a complaint that carries with it a high risk of complications and morbidity.  The differential diagnosis includes appendicitis, cholecystitis, pyelonephritis, UTI, diverticulitis   Co morbidities that complicate the patient evaluation  Obesity, hyperlipidemia   Additional history obtained:  Additional history obtained from  urgent care visit 07/21/2024 in which patient was diagnosed with a UTI and encouraged to come to emergency room for CT scan with new or worsening symptoms.   Lab Tests:  I personally interpreted labs.  The pertinent results include:   CBC with mild leukocytosis at 12, no anemia. CMP shows no electrolyte abnormality.  Normal kidney and liver function.  Lipase is within normal limits.  Pregnancy test is negative. UA is positive for large leukocytes as well as WBCs and hemoglobin.  Hemoglobin most likely due to spotting from menstrual cycle.  She was started on an antibiotic, Macrobid  by urgent care.   Imaging Studies ordered:  I ordered imaging studies including CT of abdomen pelvis. I independently visualized and interpreted imaging which showed epiploic appendagitis I agree with the radiologist interpretation   Cardiac Monitoring: / EKG:  The patient was maintained on a cardiac monitor.     Problem List / ED Course / Critical interventions / Medication management  Patient presented to emergency room with complaint of left lower quadrant pain that has been ongoing for 4 days.  She reports she has not had fever or chills at  home.  She has had some mild nausea but she is tolerating oral intake and normal bowel movements.  She is hemodynamically stable and well-appearing.  On my exam she does have some mild reproducible left lower quadrant tenderness.  She does have mild leukocytosis, but otherwise reassuring labs.  Her CT scan shows sigmoid epiploic appendagitis which does seem consistent with patient's symptoms.  Her pain has been well-controlled throughout stay here.  She was given a dose of Toradol  as well as fluids.  She reports she is feeling better. Does have small amount of blood in urine however patient reports she is still on her menstrual cycle and having spotting.  Also patient has been started on an antibiotic by urgent care.  No systemic illness.  No CVA tenderness. I have  reviewed the patients home medicines and have made adjustments as needed. Discussed labs and imaging with patient.  Discussed symptomatic management and return precautions.  Feel stable for discharge with outpatient follow-up.       Final diagnoses:  Epiploic appendagitis    ED Discharge Orders          Ordered    naproxen  (NAPROSYN ) 500 MG tablet  2 times daily        07/21/24 2109    oxycodone  (OXY-IR) 5 MG capsule  Every 6 hours PRN        07/21/24 2109               Reniyah Gootee, Warren SAILOR, PA-C 07/21/24 2133    Suzette Pac, MD 07/24/24 [phone]

## 2024-07-21 NOTE — Discharge Instructions (Signed)
 You CT scan shows epiploic appendagitis which is when inflammation of small area of fat around your colon. It is typically painful, but this is not due to infection and you treat it with pain control.  I recommend naproxen  twice daily.  You can take 1000 mg of Tylenol  every 8 hours.  Take oxycodone  for breakthrough pain.  Return to ER with new or worsening symptoms.

## 2024-07-21 NOTE — Discharge Instructions (Addendum)
 You have a urinary tract infection. I sent your urine for culture to be sure the antibiotic prescribed will treat your infection. Someone may call you to change antibiotics. Stop by the pharmacy to pick up your prescriptions.  Follow up with your primary care provider or return to the urgent care, if not improving.

## 2024-07-21 NOTE — ED Notes (Signed)
 Unable to get IV x2. Asking second RN to attempt

## 2024-07-21 NOTE — ED Triage Notes (Signed)
 Pt stated that she was seen at [facility] for abd pain earlier and was told that she had a UTI but that she should come to the ED for a possible bleeding ovarian cyst. Pt has hx of ovarian cysts.

## 2024-07-22 LAB — URINE CULTURE: Culture: >=100000 — AB

## 2024-07-24 ENCOUNTER — Ambulatory Visit (HOSPITAL_COMMUNITY): Payer: Self-pay

## 2024-11-21 ENCOUNTER — Telehealth: Admitting: Physician Assistant

## 2024-11-21 DIAGNOSIS — J208 Acute bronchitis due to other specified organisms: Secondary | ICD-10-CM

## 2024-11-21 DIAGNOSIS — B9689 Other specified bacterial agents as the cause of diseases classified elsewhere: Secondary | ICD-10-CM

## 2024-11-21 MED ORDER — AZITHROMYCIN 250 MG PO TABS: ORAL_TABLET | ORAL | 0 refills | Status: AC

## 2024-11-21 MED ORDER — FLUTICASONE PROPIONATE 50 MCG/ACT NA SUSP: 2.0000 | Freq: Every day | NASAL | 0 refills | Status: AC

## 2024-11-21 MED ORDER — BENZONATATE 100 MG PO CAPS: 100.0000 mg | ORAL_CAPSULE | Freq: Three times a day (TID) | ORAL | 0 refills | Status: AC | PRN

## 2024-11-21 NOTE — Progress Notes (Signed)
 We are sorry that you are not feeling well.  Here is how we plan to help!  Based on your presentation I believe you most likely have A cough due to bacteria.  When patients have a fever and a productive cough with a change in color or increased sputum production, we are concerned about bacterial bronchitis.  If left untreated it can progress to pneumonia.  If your symptoms do not improve with your treatment plan it is important that you contact your provider.   I have prescribed Azithromyin 250 mg: two tablets now and then one tablet daily for 4 additonal days    In addition you may use A prescription cough medication called Tessalon  Perles 100mg . You may take 1-2 capsules every 8 hours as needed for your cough.  I have also prescribed Fluticasone  nasal spray Use 2 sprays in each nostril daily for 10-14 days for drainage and nasal congestion.  From your responses in the eVisit questionnaire you describe inflammation in the upper respiratory tract which is causing a significant cough.  This is commonly called Bronchitis and has four common causes:   Allergies Viral Infections Acid Reflux Bacterial Infection Allergies, viruses and acid reflux are treated by controlling symptoms or eliminating the cause. An example might be a cough caused by taking certain blood pressure medications. You stop the cough by changing the medication. Another example might be a cough caused by acid reflux. Controlling the reflux helps control the cough.  USE OF BRONCHODILATOR (RESCUE) INHALERS: There is a risk from using your bronchodilator too frequently.  The risk is that over-reliance on a medication which only relaxes the muscles surrounding the breathing tubes can reduce the effectiveness of medications prescribed to reduce swelling and congestion of the tubes themselves.  Although you feel brief relief from the bronchodilator inhaler, your asthma may actually be worsening with the tubes becoming more swollen and  filled with mucus.  This can delay other crucial treatments, such as oral steroid medications. If you need to use a bronchodilator inhaler daily, several times per day, you should discuss this with your provider.  There are probably better treatments that could be used to keep your asthma under control.     HOME CARE Only take medications as instructed by your medical team. Complete the entire course of an antibiotic. Drink plenty of fluids and get plenty of rest. Avoid close contacts especially the very young and the elderly Cover your mouth if you cough or cough into your sleeve. Always remember to wash your hands A steam or ultrasonic humidifier can help congestion.   GET HELP RIGHT AWAY IF: You develop worsening fever. You become short of breath You cough up blood. Your symptoms persist after you have completed your treatment plan MAKE SURE YOU  Understand these instructions. Will watch your condition. Will get help right away if you are not doing well or get worse.  Your e-visit answers were reviewed by a board certified advanced clinical practitioner to complete your personal care plan.  Depending on the condition, your plan could have included both over the counter or prescription medications. If there is a problem please reply  once you have received a response from your provider. Your safety is important to us .  If you have drug allergies check your prescription carefully.    You can use MyChart to ask questions about todays visit, request a non-urgent call back, or ask for a work or school excuse for 24 hours related to this e-Visit.  If it has been greater than 24 hours you will need to follow up with your provider, or enter a new e-Visit to address those concerns. You will get an e-mail in the next two days asking about your experience.  I hope that your e-visit has been valuable and will speed your recovery. Thank you for using e-visits.   I have spent 5 minutes in review of  e-visit questionnaire, review and updating patient chart, medical decision making and response to patient.   Delon CHRISTELLA Dickinson, PA-C
